# Patient Record
Sex: Male | Born: 1940
Health system: Southern US, Community
[De-identification: ages and names within clinical notes are randomized; demographics above are authoritative.]

## PROBLEM LIST (undated history)

## (undated) DIAGNOSIS — K219 Gastro-esophageal reflux disease without esophagitis: Secondary | ICD-10-CM

## (undated) DIAGNOSIS — I2510 Atherosclerotic heart disease of native coronary artery without angina pectoris: Secondary | ICD-10-CM

## (undated) DIAGNOSIS — E785 Hyperlipidemia, unspecified: Secondary | ICD-10-CM

## (undated) DIAGNOSIS — M1990 Unspecified osteoarthritis, unspecified site: Secondary | ICD-10-CM

## (undated) DIAGNOSIS — I10 Essential (primary) hypertension: Secondary | ICD-10-CM

## (undated) DIAGNOSIS — C801 Malignant (primary) neoplasm, unspecified: Secondary | ICD-10-CM

## (undated) DIAGNOSIS — IMO0001 Reserved for inherently not codable concepts without codable children: Secondary | ICD-10-CM

## (undated) DIAGNOSIS — F419 Anxiety disorder, unspecified: Secondary | ICD-10-CM

## (undated) DIAGNOSIS — Z955 Presence of coronary angioplasty implant and graft: Secondary | ICD-10-CM

## (undated) HISTORY — PX: LIPOMA EXCISION: SHX5283

## (undated) HISTORY — DX: Reserved for inherently not codable concepts without codable children: IMO0001

## (undated) HISTORY — DX: Malignant (primary) neoplasm, unspecified: C80.1

## (undated) HISTORY — PX: OTHER SURGICAL HISTORY: SHX169

## (undated) HISTORY — DX: Gastro-esophageal reflux disease without esophagitis: K21.9

## (undated) HISTORY — PX: CYSTECTOMY: SUR359

## (undated) HISTORY — DX: Atherosclerotic heart disease of native coronary artery without angina pectoris: I25.10

## (undated) HISTORY — DX: Hyperlipidemia, unspecified: E78.5

## (undated) HISTORY — PX: CATARACT EXTRACTION: SUR2

## (undated) HISTORY — PX: CORONARY ANGIOPLASTY WITH STENT PLACEMENT: SHX49

---

## 1998-03-14 ENCOUNTER — Ambulatory Visit (HOSPITAL_BASED_OUTPATIENT_CLINIC_OR_DEPARTMENT_OTHER): Admission: RE | Admit: 1998-03-14 | Discharge: 1998-03-14 | Payer: Self-pay | Admitting: General Surgery

## 1998-11-11 ENCOUNTER — Ambulatory Visit (HOSPITAL_COMMUNITY): Admission: RE | Admit: 1998-11-11 | Discharge: 1998-11-11 | Payer: Self-pay | Admitting: Gastroenterology

## 1999-05-20 ENCOUNTER — Inpatient Hospital Stay (HOSPITAL_COMMUNITY): Admission: EM | Admit: 1999-05-20 | Discharge: 1999-05-21 | Payer: Self-pay | Admitting: Emergency Medicine

## 2000-09-22 ENCOUNTER — Ambulatory Visit (HOSPITAL_COMMUNITY): Admission: RE | Admit: 2000-09-22 | Discharge: 2000-09-22 | Payer: Self-pay | Admitting: Cardiovascular Disease

## 2000-09-23 ENCOUNTER — Inpatient Hospital Stay (HOSPITAL_COMMUNITY): Admission: RE | Admit: 2000-09-23 | Discharge: 2000-09-26 | Payer: Self-pay | Admitting: Cardiovascular Disease

## 2000-10-05 ENCOUNTER — Ambulatory Visit (HOSPITAL_COMMUNITY): Admission: RE | Admit: 2000-10-05 | Discharge: 2000-10-06 | Payer: Self-pay | Admitting: Cardiovascular Disease

## 2001-11-07 ENCOUNTER — Encounter (INDEPENDENT_AMBULATORY_CARE_PROVIDER_SITE_OTHER): Payer: Self-pay | Admitting: Specialist

## 2001-11-07 ENCOUNTER — Ambulatory Visit (HOSPITAL_COMMUNITY): Admission: RE | Admit: 2001-11-07 | Discharge: 2001-11-07 | Payer: Self-pay | Admitting: Gastroenterology

## 2004-06-02 ENCOUNTER — Encounter: Admission: RE | Admit: 2004-06-02 | Discharge: 2004-06-02 | Payer: Self-pay | Admitting: Orthopedic Surgery

## 2004-06-04 ENCOUNTER — Ambulatory Visit (HOSPITAL_COMMUNITY): Admission: RE | Admit: 2004-06-04 | Discharge: 2004-06-04 | Payer: Self-pay | Admitting: Orthopedic Surgery

## 2004-06-04 ENCOUNTER — Ambulatory Visit (HOSPITAL_BASED_OUTPATIENT_CLINIC_OR_DEPARTMENT_OTHER): Admission: RE | Admit: 2004-06-04 | Discharge: 2004-06-04 | Payer: Self-pay | Admitting: Orthopedic Surgery

## 2004-06-04 ENCOUNTER — Encounter (INDEPENDENT_AMBULATORY_CARE_PROVIDER_SITE_OTHER): Payer: Self-pay | Admitting: Specialist

## 2007-05-09 HISTORY — PX: CARDIOVASCULAR STRESS TEST: SHX262

## 2008-07-05 ENCOUNTER — Ambulatory Visit (HOSPITAL_BASED_OUTPATIENT_CLINIC_OR_DEPARTMENT_OTHER): Admission: RE | Admit: 2008-07-05 | Discharge: 2008-07-05 | Payer: Self-pay | Admitting: Urology

## 2008-07-05 ENCOUNTER — Encounter (INDEPENDENT_AMBULATORY_CARE_PROVIDER_SITE_OTHER): Payer: Self-pay | Admitting: Urology

## 2010-06-16 ENCOUNTER — Ambulatory Visit: Payer: Self-pay | Admitting: Cardiovascular Disease

## 2010-12-16 ENCOUNTER — Ambulatory Visit (INDEPENDENT_AMBULATORY_CARE_PROVIDER_SITE_OTHER): Payer: Medicare Other | Admitting: Cardiovascular Disease

## 2010-12-16 DIAGNOSIS — Z9861 Coronary angioplasty status: Secondary | ICD-10-CM

## 2010-12-16 DIAGNOSIS — E7800 Pure hypercholesterolemia, unspecified: Secondary | ICD-10-CM

## 2011-02-16 NOTE — Op Note (Signed)
[name] [name], [name] [name]                 ACCOUNT NO.:  [id number]   MEDICAL RECORD NO.:  [id number]          PATIENT TYPE:  AMB   LOCATION:  [facility]                         FACILITY:  [facility]   PHYSICIAN:  Sigmund I. Patsi Sears, M.D.DATE OF BIRTH:  06-Jul-1941   DATE OF PROCEDURE:  07/05/2008  DATE OF DISCHARGE:                               OPERATIVE REPORT   PREOPERATIVE DIAGNOSIS:  Benign prostatic hypertrophy with obstruction.   POSTOPERATIVE DIAGNOSIS:  Benign prostatic hypertrophy with obstruction.   PROCEDURE:  1. Cystourethroscopy.  2. Transurethral resection of prostate with electrovaporization of the      prostate.   ATTENDING PHYSICIAN:  Sigmund I. Patsi Sears, M.D.   RESIDENT PHYSICIAN:  Dr. Delman Kitten.   ANESTHESIA:  General.   INDICATIONS FOR PROCEDURE:  Mr. Fell is a 70 year old white male who  has a past medical history positive for bothersome bladder outlet  obstruction.  He has been on alpha blocker therapy without significant  improvement.  He and Dr. Patsi Sears discussed his symptomatology and  possible further treatments and he requested to have his problem taken  care of in one single procedure.  Likewise the above stated procedure  was recommended.  Preoperatively risks, benefits, consequences and  concerns were discussed and informed consent was obtained.   PROCEDURE IN DETAIL:  The patient was brought to the operating room,  placed in supine position.  He was correctly identified by his wristband  and appropriate time-out was taken.  IV antibiotics were administered.  General anesthesia was delivered.  Once adequately anesthetized he was  placed in dorsal lithotomy position.  Great care was taken to minimize  the risk of peripheral neuropathy or compartment syndrome.  His perineum  was prepped and draped sterilely.  We began our procedure by performing  rigid cystourethroscopy.  We placed a 28 French rigid resectoscope  sheath over an obturator into his  bladder.  The obturator was removed  and an Olympus thin wire resectoscope and loop were placed into the  sheath.  Pancystoscopy did not demonstrate any urothelial abnormalities.  Both ureteral orifices were noted to be in their normal anatomic  position effluxing clear urine.  He did not have a tremendous amount of  trabeculation and no cellules were seen.  In advancing the resectoscope  into the prostatic urethra a high median bar was appreciated.  He did  have bilobar prostatic hypertrophy but the lobes did not coapt in the  midline.  He had a short prostate and the verumontanum was readily  visible.  We subsequently proceeded to resect tissue circumferentially  through his prostate and sent these TURP chips off to pathology for  analysis.  We then removed this current scope and replaced the 26 French  Olympus resectoscope initially over an obturator and then inserted the  resectoscope and the gyrus electrode.  We changed our irrigation to  normal saline and then systematically electrovaporized the remainder of  his prostate tissue taking great care to address first his bladder neck  in a trough in the 6 o'clock position then his left lobe, his right lobe  and  anteriorly.  Our resection did not advance distal to the  verumontanum.  There was minimal bleeding.  There was no evidence of  perforation and on final inspection the ureteral orifices were uninjured  and continued to efflux clear urine.  We fulgurated the bleeders and  subsequently removed the resectoscope.  We then placed a 24 [place] three-  way catheter and drained clear urine  from his bladder.  We inflated the balloon with 30 mL of sterile water  and this marked the end of our procedure.  He tolerate the procedure  well.  There were no complications.  Dr. Patsi Sears was present and  participated in all aspects of the case.  He awoke and was taken to the  recovery room in stable condition.      ______________________________  Dr Delman Kitten      Sigmund I. Patsi Sears, M.D.  Electronically Signed    DW/MEDQ  D:  07/05/2008  T:  07/06/2008  Job:  [id number]

## 2011-02-19 NOTE — Discharge Summary (Signed)
[place]. [facility]  Patient:    KINTE, TRIM                        MRN: [id number] Adm. Date:  [id number] Disc. Date: 10/06/00 Attending:  Koren Bound CC:         Delorse Lek, M.D.                           Discharge Summary  DISCHARGE DIAGNOSES: 1. Coronary artery disease, status post percutaneous transluminal coronary    angioplasty of the posterior descending artery. 2. Status post recent percutaneous transluminal coronary angioplasty and    stenting of the left circumflex artery. 3. Diffuse disease involving the left anterior descending artery.  CONDITION ON DISCHARGE:  Improved.  DISCHARGE MEDICATIONS: 1. Enteric-coated aspirin 325 mg a day. 2. Plavix 75 mg a day. 3. Imdur 30 mg a day. 4. Toprol XL 50 mg a day/may substitute atenolol 50 mg a day if patient    desires. 5. Prilosec 20 mg a day for one month. 6. Nitroglycerin as needed.  FOLLOW-UP:  The patient will see Dr. Elease Hashimoto in one week.  DISCHARGE INSTRUCTIONS:  He is to watch for any signs of bleeding.  He is to eat a low fat, low cholesterol diet.  He has been instructed to walk every day.  HISTORY OF PRESENT ILLNESS:  Mr. Wainwright is a 70 year old gentleman with a history of coronary artery disease.  He is status post successful PTCA and stenting of his left circumflex artery last week.  He returns today for the second staged procedure.  HOSPITAL COURSE: #1 - CORONARY ARTERY DISEASE:  The patient had a heart catheterization which revealed a tight 95-99% stenosis of the posterior descending artery.  He underwent successful PTCA of the distal right coronary artery/posterior descending artery using a 2.5 mm maverick balloon.  The patient tolerated the procedure quite well.  He is discharged today in satisfactory condition.  We will see him back in the office in follow-up.  We will check his cholesterol profile to further assess his risk for further coronary events.   DD:  10/06/00 TD:  10/06/00 Job: [id number]

## 2011-02-19 NOTE — Discharge Summary (Signed)
[place]. [facility]  Patient:    [name] [name], [name] [name]                        MRN: [id number] Adm. Date:  [id number] Disc. Date: 09/26/00 Attending:  Koren Bound                           Discharge Summary  ADMISSION DIAGNOSIS:  Unstable angina.  DISCHARGE DIAGNOSES: 1. Posterolateral myocardial infarction, status post percutaneous transluminal    coronary angioplasty and stenting of his left circumflex artery. 2. Unstable angina. 3. Diffuse coronary artery disease.  DISCHARGE MEDICATIONS: 1. Enteric-coated aspirin 325 mg a day. 2. Plavix 75 mg a day for 28 days. 3. Nitroglycerin 0.4 mg sublingual as needed. 4. Toprol XL 50 mg a day. 5. Imdur 30 mg a day.  DIET:  The patient has been instructed to eat a low fat, low cholesterol diet. He is to walk every day.  He is to watch for signs of bleeding.  FOLLOW-UP:  He is to see Dr. Elease Hashimoto on Thursday.  He still has a tight lesion in his posterior descending artery.  We will need to decide when he needs PTCA of this vessel.  We will allow him to heal up from this MI for a week or so.  HISTORY OF PRESENT ILLNESS:  [name] [name] is a 70 year old gentleman with a recent onset of unstable angina.  He had a heart catheterization last Thursday, which revealed diffuse three-vessel disease.  Dr. Tyrone Sage was consulted from [facility].  It was determined that Mr. Henery was not a good candidate for bypass because of the relatively diffusely diseased LAD.  He was then scheduled for PTCA of his left circumflex artery and right coronary artery.  He developed chest pain several days later and was admitted for unstable angina.  Please see dictated H&P for further details.  HOSPITAL COURSE: #1 - CHEST PAIN:  The patient had a prolonged episode of chest pain on admission.  He ruled in for myocardial infarction with peak CPKs of 964 with 104 MBs.  His EKG remained unremarkable.  He was taken to the  catheterization laboratory and was found to have an occluded circumflex artery.  He underwent successful PTCA and stenting of his left circumflex artery by Dr. Peter Swaziland.  The patient improved significantly.  The following day he was up ambulating.  He is currently stable from a cardiovascular standpoint.  The patient still has a tight stenosis in his posterior descending artery.  He is stable, and this does not appear to need to be opened at the current time.  We will plan on an elective PTCA in the next week or so.  The patient will be discharged on the above-noted medications and disposition. DD:  09/26/00 TD:  09/27/00 Job: [id number]

## 2011-02-19 NOTE — Cardiovascular Report (Signed)
[place]. [facility]  Patient:    [name] [name], [name] [name]                        MRN: [id number] Proc. Date: 09/22/00 Adm. Date:  [id number] Disc. Date: [id number] Attending:  Koren Bound CC:         Delorse Lek, M.D.  Gwenith Daily Tyrone Sage, M.D.  Cardiac Catheterization Laboratory   Cardiac Catheterization  INDICATIONS:  Mr. Ravenscroft is a 70 year old gentleman, with a recent onset of classical angina.  He has had chest tightness with minimal exertion for the past several weeks.  The patient does not have any significant other medical problems.  The patient was referred for heart catheterization after having an early positive treadmill test, which revealed ischemia just after three minutes.  PROCEDURES:  Left heart catheterization with coronary angiography.  DESCRIPTION OF PROCEDURE:  The right femoral artery was easily cannulated using the modified Seldinger technique.  HEMODYNAMICS:  The left ventricular pressure is 127/16 with an aortic pressure of 127/80.  ANGIOGRAPHY:  The left main coronary artery is fairly large.  There is a 40% stenosis in the distal aspect of the left main.  This lesion does not appear to obstruct blood flow.  The left anterior descending artery is heavily calcified.  There is a diffuse 20-30% stenosis in the proximal LAD.  The mid LAD is diseased to approximately 40-50%.  The distal LAD has diffuse 75-80% stenosis throughout with several discrete 90% stenoses.  The first diagonal vessel is a moderate sized vessel with a 70-80% stenosis proximally.  The second diagonal vessel is a small to moderate sized vessel with a 50% stenosis proximally.  The left circumflex artery is a fairly large vessel.  There is a 50% stenosis very proximally followed by a tight 99% stenoses in the first obtuse marginal artery.  The first obtuse marginal artery bifurcates towards its termination. There is a tight "star lesion" involving  these two distal branches.  These stenoses are each approximately 95%.  The right coronary artery is a large and dominant vessel.  There is a 30% stenosis proximally.  The mid right coronary artery has only mild irregularities.  The distal right coronary artery has some diffuse disease.  There is a 90% stenosis in the proximal aspect of the posterior descending artery. The posterolateral segment artery is fairly normal.  The left subclavian artery is largely free of disease.  The left internal mammary artery is normal.  The left vertebral artery has a proximal 40% stenosis.  LEFT VENTRICULOGRAM:  The left ventriculogram was performed in a 30 RAO position.  It reveals overall normal left ventricular systolic function. There is no significant mitral regurgitation.  The ejection fraction is approximately 70%.  COMPLICATIONS:  None.  CONCLUSIONS:  Diffuse coronary artery disease involving all three coronary arteries.  There is significant distal disease.  It is not clear whether these distal vessels are good targets for bypass grafting.  We could perform angioplasty and stenting on these vessels, although the risks of re-stenosis is quite high, given the severity and the diffuseness of the disease.  We will have Dr. Tyrone Sage review the films.  We will add Imdur to his current medications (including aspirin and Toprol). DD:  09/22/00 TD:  09/23/00 Job: [id number]

## 2011-02-19 NOTE — Op Note (Signed)
NAME:  [name] [name], [name] [name]                           ACCOUNT NO.:  [id number]   MEDICAL RECORD NO.:  [id number]                   PATIENT TYPE:  AMB   LOCATION:  [facility]                                  FACILITY:  [facility]   PHYSICIAN:  Cindee Salt, M.D.                    DATE OF BIRTH:  08/04/1941   DATE OF PROCEDURE:  06/04/2004  DATE OF DISCHARGE:                                 OPERATIVE REPORT   PREOPERATIVE DIAGNOSIS:  Mucoid cyst, right thumb.   POSTOPERATIVE DIAGNOSIS:  Mucoid cyst, right thumb.   OPERATION:  Incision of cyst with debridement of interphalangeal joint --  right thumb.   SURGEON:  Cindee Salt, M.D.   ASSISTANT:  Carolyne Fiscal   ANESTHESIA:  Forearm-based IV regional.   HISTORY:  The patient is a 70 year old male with a history of a mucoid cyst  on his right thumb.  This has ruptured and healed over.   DESCRIPTION OF PROCEDURE:  The patient is brought to the operating room,  where a forearm-based IV regional anesthetic was carried out without  difficulty.  He was prepped and draped using Duraprep in the supine  position, with the right arm free.   Following adequate anesthesia, a curvilinear incision was made just proximal  to the mass.  This was carried down through subcutaneous tissue.  A moderate  amount of granulation tissue and scarring was present.  The cyst was easily  identified; this was excised and sent to pathology.  The joint was opened.  A rongeur was then used to perform a synovectomy and debride osteophytes  from the interphalangeal joint.  The wound was irrigated.  The skin was then  able to be rotated distally, after excision of the entire skin surrounding  the cyst and cystic area.  This was then irrigated and closed with  interrupted 5-0 nylon suture.   A sterile compressive dressing was applied to the thumb.  The patient  tolerated the procedure well and was taken to the recovery room for  observation in satisfactory condition.  He is discharged home to  return to  the [facility] [place] in one week on Vicodin.                                               Cindee Salt, M.D.    Angelique Blonder  D:  06/04/2004  T:  06/04/2004  Job:  [id number]

## 2011-02-19 NOTE — H&P (Signed)
[place]. [facility]  Patient:    ARCHIE, SHEA                        MRN: [id number] Adm. Date:  [id number] Disc. Date: [id number] Attending:  Koren Bound CC:         Delorse Lek, M.D.   History and Physical  CHIEF COMPLAINT: Mr. Safley is a 70 year old gentleman with a history of coronary artery disease, admitted with symptoms of unstable angina.  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  He has been in relatively good health.  For the past week or so he has had worsening episodes of chest pain.  He was seen in the office on Wednesday for symptoms of unstable angina and on Thursday he underwent heart catheterization, which revealed moderate to severe coronary artery disease involving all three coronary arteries.  His LAD had diffuse disease involving the mid and distal vessel.  This vessel was determined not to be a good candidate for angioplasty or bypass surgery.  His left circumflex artery had a proximal 90% stenosis and a 90% star lesion at the bifurcation of the first obtuse marginal artery.  The right coronary artery was a large and dominant vessel.  There was a 90% stenosis in the takeoff of the posterior descending artery.  Dr. Tyrone Sage was consulted and in his opinion felt the LAD was not a good bypass target.  He suggested culprit angioplasty involving the left circumflex artery and right coronary artery as an initial treatment regimen since the LAD was not a good target.  Our plan was to do angioplasty next week.  He was to come into the office today to discuss these issues and on the way in he started having episodes of chest pain.  In the office today he had severe chest tightness.  The chest pain was 90% relieved with sublingual nitroglycerin.  He was admitted to the hospital with symptoms of unstable angina.  CURRENT MEDICATIONS:  1. Imdur 30 mg q.d.  2. Toprol XL 50 mg q.d.  3.  Nitroglycerin sublingual.  4. Aspirin 325 mg q.d.  5. He has also been taking ibuprofen 600-800 mg q.d.  ALLERGIES: No known drug allergies.  PAST MEDICAL HISTORY:  1. Gastroesophageal reflux.  2. Benign cyst on his back.  3. Hypercholesterolemia.  SOCIAL HISTORY: The patient works as a Museum/gallery curator.  He bowls quite regularly.  He does not smoke and does not drink.  FAMILY HISTORY: His father died at the age of 27 due to myocardial infarction. His mother died at the age of 43 due to cancer.  REVIEW OF SYSTEMS: His Review Of Systems was reviewed and is essentially negative.  PHYSICAL EXAMINATION:  GENERAL: HE is middle-aged gentleman in moderate distress.  His chest pain is significantly better than when he originally presented to the office.  VITAL SIGNS: Weight 210 pounds.  Blood pressure is 120/90 in the left arm and in the right arm is 130/90.  His heart rate is 88.  HEENT/NECK: There are 2+ carotids.  There is no JVD.  He has no thyromegaly and no lymphadenopathy.  CHEST: His lungs were clear to auscultation.  BACK: HIs back is nontender.  HEART: Regular rate.  S1 and S2.  No murmurs, rubs, or gallops.  ABDOMEN: Good bowel sounds, nontender.  EXTREMITIES: No clubbing, cyanosis, or edema.  The catheterization site is well-healed.  There is no calf  tenderness.  His distal pulses are intact.  NEUROLOGIC: Cranial nerves 2-12 intact.  Motor and sensory function are intact.  His gait is fairly normal.  LABORATORY DATA: His EKG obtained in the office and here in the hospital reveals normal sinus rhythm.  There are no acute ST-T wave changes.  IMPRESSION/PLAN:  1. Mr. Cutshaw presents with symptoms consistent with unstable angina. He has     known severe coronary artery disease.  We will place him on intravenous     nitroglycerin and intravenous heparin.  We will anticipate doing     percutaneous transluminal coronary angioplasty of his left circumflex     artery and  right coronary artery on Monday.  We will proceed with     angioplasty sooner if he becomes unstable.  2. Hypercholesterolemia.  His fasting lipid profile from the office on     September 20, 2000 revealed a triglyceride level of 578 and a total     cholesterol of 185.  We will placed him on a diet and perhaps some     medications, and recheck his lipids soon.  3. His other medical problems are currently stable.   DD:  09/23/00 TD:  09/24/00 Job: [id number]

## 2011-02-19 NOTE — Cardiovascular Report (Signed)
[place]. [facility]  Patient:    [name] [name], [name] [name]                        MRN: [id number] Proc. Date: 10/05/00 Adm. Date:  [id number] Attending:  Koren Bound CC:         Cardiac Catheterization Lab  Delorse Lek, M.D.   Cardiac Catheterization  HISTORY:  [name] is a 70 year old gentleman with a recent onset of angina. He had successful PTCA and stenting of his left circumflex artery last week as an acute intervention.  He has known stenosis of his posterior descending artery and moderate disease involving his LAD.  He is brought back to today for PTCA of his posterior descending artery.  PROCEDURE:  Cardiac catheterization and percutaneous transluminal coronary angioplasty posterior descending artery.  CARDIOLOGIST:  Vesta Mixer, Montez Hageman., M.D.  DESCRIPTION OF PROCEDURE:  The right femoral artery was easily cannulated using a modified Seldinger technique.  A 7-French sheath was placed.  We took a quick relook at the left circumflex system.  The stent was found to be widely patent.  The distal circumflex artery was widely patent in the larger branch.  The very small distal branch has an 80 to 90% stenosis which is basically unchanged from before his angioplasty.  The right coronary artery has diffuse disease involving the proximal and mid segments.  The posterior descending artery has a 95% stenosis at its proximal segment.  The patient was given 4800 units of heparin.  A double bolus Integrilin drip was given.  The posterior descending artery was easily wired using an 014 short Patriot.  A 2.5 mm x 15 mm Maverick balloon was positioned across the stenosis.  One inflation up to 6 atmospheres was performed for 1 minute.  This resulted in a widely patent lumen.  There was a 0% residual stenosis.  The proximal and mid lesions were not affected by passage of the wire or the balloon.  COMPLICATIONS:  None.  CONCLUSIONS: 1. Successful  percutaneous transluminal coronary angioplasty (PTCA) of the    posterior descending artery. 2. Good short-term results of the PTCA and stenting of the left circumflex    artery. 3. Moderate disease involving the left anterior descending artery.  We will    continue with medical therapy. DD:  10/05/00 TD:  10/05/00 Job: [id number]

## 2011-02-19 NOTE — Cardiovascular Report (Signed)
[place]  Patient:    [name] [name], [name] [name]                        MRN: [id number] Proc. Date: 09/24/00 Adm. Date:  [id number] Disc. Date: [id number] Attending:  Koren Bound CC:         Alvia Grove., M.D.   Cardiac Catheterization  PROCEDURES PERFORMED:  Emergent cardiac catheterization and stent procedure.  INDICATIONS FOR PROCEDURE:  The patient is a 70 year old white male, who recently underwent cardiac catheterization which showed significant three-vessel coronary disease.  The patient was considered for bypass surgery, but due to the fact that the disease, particularly in the LAD was predominately distal, it was felt that he would be better for a catheter based intervention of the culprit vessel.  He did have a very high-grade stenosis in the proximal circumflex.  He was admitted with unstable angina.  On the day of procedure, the patient had refractory chest pain despite maximal medical therapy.  His ECG remained normal but cardiac enzymes were elevated.  He was brought to the lab for acute intervention.  ACCESS:  Via the right femoral artery using the standard Seldinger technique.  EQUIPMENT:  A 7 French arterial sheath, 6 French 4 cm right and left Judkins catheter, 6 French pigtail catheter. a 7 [place] Voda left 4 guide, 0.014 Hi-Torque Floppy wire, a 2.5 x 15 mm CrossSail balloon and a 3.0 x 15 mm P Penta stent.  HEMODYNAMIC DATA:  Aortic pressure was 139/91, left ventricular pressure 130/31.  There was no aortic valve gradient by pullback.  MEDICATIONS:  The patient was on continuous infusion of IV Integrilin and also on IV nitroglycerin.  He was given additional intracoronary nitroglycerin 200 mcg x 3, and a total of 5800 units of heparin IV.  ANGIOGRAPHIC DATA:  Limited angiography demonstrated the left coronary artery with a 20-30% distal left main stenosis.  The left anterior descending artery was diffusely  calcified proximally with minor wall irregularities.  In the mid to distal vessels there were sequential 70-80% stenoses, and in the far distal LAD, there was sequential 90% stenoses. The first and second diagonal branches were small with 70-80% stenoses at the takeoff.  The left circumflex coronary was occluded proximally at the site of a previous high-grade stenosis.  The right coronary artery arises and distributes normally.  There was 30% narrowing proximally.  The posterior descending artery had a 90% stenosis at its takeoff.  LEFT VENTRICULAR ANGIOGRAPHY:  Left ventricular angiography in the RAO view demonstrated normal left ventricular size and contractility with normal systolic function.  Ejection fraction was estimated at 65%.  We proceeded with acute intervention of the circumflex coronary artery.  Due to acute angulation of the circumflex from the left main coronary artery, this was a difficult lesion to cross.  Initial guide with a JL4 guide was yielded inadequate support, and we switched to a Voda guide.  With this we were able to cross the lesion with the guidewire and subsequently performed initial inflation using a 2.5 mm CrossSail balloon up to 8 atmospheres.  This yielded reperfusion of the vessel.  It was evident at this point that there was also a 90% stenosis in the obtuse marginal vessel at the bifurcation of two branches. We selected the larger of the two branches and performed balloon angioplasty of this lesion, also using the 2.5 mm balloon in dilatating to 8 atmospheres. This yielded  excellent angiographic result in the larger branch.  We then proceeded with stenting of the proximal to mid circumflex using a 3.0 x 15 mm Penta stent.  This was deployed at 8 atmospheres and postdilated to 14 atmospheres with an excellent angiographic result.  In the site of the previous occlusion, there was now a 0% residual stenosis with no filling defects noted.  The obtuse  marginal vessel also had 0% residual stenosis at the site of the angioplasty.  There was still a small side branch with an 80-90% narrowing, but it was felt that this branch was too small to dilate.  FINAL INTERPRETATION: 1. Three-vessel obstructive atherosclerotic coronary artery disease with    acute recent occlusion of the left circumflex coronary artery. 2. Successful stenting of the proximal left circumflex coronary artery and    successful balloon angioplasty of the first obtuse marginal vessel. 3. Normal left ventricular function. DD:  09/29/00 TD:  09/29/00 Job: [id number]

## 2011-03-18 ENCOUNTER — Other Ambulatory Visit: Payer: Self-pay | Admitting: *Deleted

## 2011-03-18 DIAGNOSIS — E785 Hyperlipidemia, unspecified: Secondary | ICD-10-CM

## 2011-03-19 ENCOUNTER — Other Ambulatory Visit (INDEPENDENT_AMBULATORY_CARE_PROVIDER_SITE_OTHER): Payer: Medicare Other | Admitting: *Deleted

## 2011-03-19 ENCOUNTER — Other Ambulatory Visit: Payer: Self-pay | Admitting: Cardiovascular Disease

## 2011-03-19 DIAGNOSIS — E785 Hyperlipidemia, unspecified: Secondary | ICD-10-CM

## 2011-03-19 LAB — HEPATIC FUNCTION PANEL
ALT: 28 U/L (ref 0–53)
AST: 22 U/L (ref 0–37)
Albumin: 4.3 g/dL (ref 3.5–5.2)
Alkaline Phosphatase: 65 U/L (ref 39–117)
Bilirubin, Direct: 0.1 mg/dL (ref 0.0–0.3)
Total Bilirubin: 0.6 mg/dL (ref 0.3–1.2)
Total Protein: 7.4 g/dL (ref 6.0–8.3)

## 2011-03-19 LAB — LDL CHOLESTEROL, DIRECT: Direct LDL: 152 mg/dL

## 2011-03-19 LAB — LIPID PANEL
Cholesterol: 205 mg/dL — ABNORMAL HIGH (ref 0–200)
HDL: 41.7 mg/dL (ref >39.00)
Total CHOL/HDL Ratio: 5
Triglycerides: 110 mg/dL (ref 0.0–149.0)
VLDL: 22 mg/dL (ref 0.0–40.0)

## 2011-03-19 LAB — BASIC METABOLIC PANEL
BUN: 17 mg/dL (ref 6–23)
CO2: 28 mEq/L (ref 19–32)
Calcium: 9.1 mg/dL (ref 8.4–10.5)
Chloride: 103 mEq/L (ref 96–112)
Creatinine, Ser: 0.8 mg/dL (ref 0.4–1.5)
GFR: 105.28 mL/min (ref >60.00)
Glucose, Bld: 92 mg/dL (ref 70–99)
Potassium: 4.7 mEq/L (ref 3.5–5.1)
Sodium: 137 mEq/L (ref 135–145)

## 2011-03-22 ENCOUNTER — Other Ambulatory Visit: Payer: Medicare Other | Admitting: *Deleted

## 2011-03-23 ENCOUNTER — Other Ambulatory Visit: Payer: Self-pay | Admitting: *Deleted

## 2011-03-23 ENCOUNTER — Other Ambulatory Visit: Payer: Self-pay | Admitting: Cardiovascular Disease

## 2011-03-23 NOTE — Telephone Encounter (Signed)
Pt called with lab results(cholesterol) while on zetia, wants to try lipitor 40mg , dr ok with it and msg left on his home line. Pt not allergic to med thought muscle ache  was from exertion.

## 2011-03-23 NOTE — Progress Notes (Signed)
Pt wanted to go on lipitor, dr ok with that, escribe done,

## 2011-03-23 NOTE — Telephone Encounter (Signed)
Pt cant have lipitor 80mg  cut in half, went

## 2011-03-24 ENCOUNTER — Other Ambulatory Visit: Payer: Self-pay | Admitting: *Deleted

## 2011-03-24 MED ORDER — ATORVASTATIN CALCIUM 40 MG PO TABS: 40.0000 mg | ORAL_TABLET | Freq: Every day | ORAL | Status: DC

## 2011-06-14 ENCOUNTER — Encounter: Payer: Self-pay | Admitting: Cardiovascular Disease

## 2011-06-21 ENCOUNTER — Encounter: Payer: Self-pay | Admitting: Cardiovascular Disease

## 2011-06-21 ENCOUNTER — Ambulatory Visit (INDEPENDENT_AMBULATORY_CARE_PROVIDER_SITE_OTHER): Payer: Medicare Other | Admitting: Cardiovascular Disease

## 2011-06-21 DIAGNOSIS — E785 Hyperlipidemia, unspecified: Secondary | ICD-10-CM

## 2011-06-21 DIAGNOSIS — I2510 Atherosclerotic heart disease of native coronary artery without angina pectoris: Secondary | ICD-10-CM

## 2011-06-21 LAB — LIPID PANEL
Cholesterol: 118 mg/dL (ref 0–200)
HDL: 34.1 mg/dL — ABNORMAL LOW (ref >39.00)
LDL Cholesterol: 68 mg/dL (ref 0–99)
Total CHOL/HDL Ratio: 3
Triglycerides: 79 mg/dL (ref 0.0–149.0)
VLDL: 15.8 mg/dL (ref 0.0–40.0)

## 2011-06-21 LAB — HEPATIC FUNCTION PANEL
ALT: 27 U/L (ref 0–53)
AST: 23 U/L (ref 0–37)
Albumin: 4.4 g/dL (ref 3.5–5.2)
Alkaline Phosphatase: 72 U/L (ref 39–117)
Bilirubin, Direct: 0.2 mg/dL (ref 0.0–0.3)
Total Bilirubin: 1.1 mg/dL (ref 0.3–1.2)
Total Protein: 7.6 g/dL (ref 6.0–8.3)

## 2011-06-21 LAB — BASIC METABOLIC PANEL
BUN: 16 mg/dL (ref 6–23)
CO2: 27 mEq/L (ref 19–32)
Calcium: 10.1 mg/dL (ref 8.4–10.5)
Chloride: 102 mEq/L (ref 96–112)
Creatinine, Ser: 0.8 mg/dL (ref 0.4–1.5)
GFR: 97.92 mL/min (ref >60.00)
Glucose, Bld: 95 mg/dL (ref 70–99)
Potassium: 4.8 mEq/L (ref 3.5–5.1)
Sodium: 136 mEq/L (ref 135–145)

## 2011-06-21 NOTE — Assessment & Plan Note (Signed)
He's now on Lipitor and niacin. We'll check lipids today and recheck him again in 6 months.

## 2011-06-21 NOTE — Progress Notes (Signed)
[name] [name] Date of Birth  1941-06-23 [facility] [address]     [address] [place], [place]  [address] [phone]  Fax  [phone]  History of Present Illness:  70 year old gentleman with a history of coronary artery disease. He status post PTCA and stenting of the left circumflex artery as well as PTCA of  the posterior descending artery and January 2002. He's done very well. He has not had any episodes of chest pain or shortness of breath.  Since I last saw him we have changed his simvastatin to Lipitor and he is also taking niacin slow release.  Current Outpatient Prescriptions on File Prior to Visit  Medication Sig Dispense Refill  . aspirin 325 MG tablet Take 325 mg by mouth daily.        [name] atorvastatin (LIPITOR) 40 MG tablet Take 1 tablet (40 mg total) by mouth daily.  90 tablet  1  . Calcium Carbonate-Vitamin D (CALCIUM + D PO) Take by mouth daily.        . metoprolol tartrate (LOPRESSOR) 25 MG tablet Take 25 mg by mouth 2 (two) times daily.        . Multiple Vitamin (MULTIVITAMIN PO) Take by mouth daily.        . niacin (NIASPAN) 1000 MG CR tablet Take 1,000 mg by mouth daily.        [name] omeprazole (PRILOSEC) 20 MG capsule Take 20 mg by mouth daily.          Allergies  Allergen Reactions  . Atorvastatin Other (See Comments)    Leg pain   . Simvastatin Other (See Comments)    Leg pain     Past Medical History  Diagnosis Date  . Hyperlipidemia   . Coronary artery disease     status post PTCA and stenting of his left circumflex artery and right coronary artery  . Reflux     Hx Gastroesophageal    Past Surgical History  Procedure Date  . Cystectomy     removed from finger  . Benign cyst     on back  . Cardiovascular stress test 05-09-2007    EF 64%    History  Smoking status  . Never Smoker   Smokeless tobacco  . Not on file    History  Alcohol Use No    No family history on file.  Reviw of Systems:    Reviewed in the HPI.  All other systems are negative.  Physical Exam: BP 120/82  Pulse 76  Ht 5\' 9"  (1.753 m)  Wt 206 lb 3.2 oz (93.532 kg)  BMI 30.45 kg/m2 The patient is alert and oriented x 3.  The mood and affect are normal.   Skin: warm and dry.  Color is normal.    HEENT:   the sclera are nonicteric.  The mucous membranes are moist.  The carotids are 2+ without bruits.  There is no thyromegaly.  There is no JVD.    Lungs: clear.  The chest wall is non tender.    Heart: regular rate with a normal S1 and S2.  There are no murmurs, gallops, or rubs. The PMI is not displaced.     Abdomen: good bowel sounds.  There is no guarding or rebound.  There is no hepatosplenomegaly or tenderness.  There are no masses.   Extremities:  no clubbing, cyanosis, or edema.  The legs are without rashes.  The distal pulses are intact.  Neuro:  Cranial nerves II - XII are intact.  Motor and sensory functions are intact.    The gait is normal.  ECG:  Assessment / Plan:

## 2011-06-21 NOTE — Assessment & Plan Note (Signed)
He's not had any episodes of angina. We'll continue with his same medications.

## 2011-06-22 ENCOUNTER — Telehealth: Payer: Self-pay | Admitting: *Deleted

## 2011-06-22 NOTE — Telephone Encounter (Signed)
Patient called with lab results. Pt verbalized understanding. Jodette Raider Valbuena RN  

## 2011-06-24 ENCOUNTER — Other Ambulatory Visit: Payer: Self-pay | Admitting: *Deleted

## 2011-06-24 MED ORDER — METOPROLOL TARTRATE 50 MG PO TABS: 25.0000 mg | ORAL_TABLET | Freq: Two times a day (BID) | ORAL | Status: DC

## 2011-06-24 NOTE — Telephone Encounter (Signed)
Pt called and verified strength and dose.

## 2011-07-06 LAB — POCT I-STAT 4, (NA,K, GLUC, HGB,HCT)
Glucose, Bld: 103 — ABNORMAL HIGH
HCT: 46
Hemoglobin: 15.6
Potassium: 4.1
Sodium: 138

## 2011-09-18 ENCOUNTER — Other Ambulatory Visit: Payer: Self-pay | Admitting: Cardiovascular Disease

## 2011-12-21 ENCOUNTER — Ambulatory Visit: Payer: Medicare Other | Admitting: Cardiovascular Disease

## 2012-01-17 ENCOUNTER — Ambulatory Visit (INDEPENDENT_AMBULATORY_CARE_PROVIDER_SITE_OTHER): Payer: Medicare Other | Admitting: Cardiovascular Disease

## 2012-01-17 ENCOUNTER — Other Ambulatory Visit (INDEPENDENT_AMBULATORY_CARE_PROVIDER_SITE_OTHER): Payer: Medicare Other

## 2012-01-17 ENCOUNTER — Encounter: Payer: Self-pay | Admitting: Cardiovascular Disease

## 2012-01-17 VITALS — BP 120/79 | HR 78 | Ht 69.0 in | Wt 204.8 lb

## 2012-01-17 DIAGNOSIS — E785 Hyperlipidemia, unspecified: Secondary | ICD-10-CM

## 2012-01-17 DIAGNOSIS — I2510 Atherosclerotic heart disease of native coronary artery without angina pectoris: Secondary | ICD-10-CM

## 2012-01-17 DIAGNOSIS — I10 Essential (primary) hypertension: Secondary | ICD-10-CM

## 2012-01-17 LAB — HEPATIC FUNCTION PANEL
ALT: 26 U/L (ref 0–53)
AST: 30 U/L (ref 0–37)
Albumin: 4.2 g/dL (ref 3.5–5.2)
Alkaline Phosphatase: 57 U/L (ref 39–117)
Bilirubin, Direct: 0.2 mg/dL (ref 0.0–0.3)
Total Bilirubin: 1.1 mg/dL (ref 0.3–1.2)
Total Protein: 7.4 g/dL (ref 6.0–8.3)

## 2012-01-17 LAB — LIPID PANEL
Cholesterol: 99 mg/dL (ref 0–200)
HDL: 32 mg/dL — ABNORMAL LOW (ref >39.00)
LDL Cholesterol: 59 mg/dL (ref 0–99)
Total CHOL/HDL Ratio: 3
Triglycerides: 42 mg/dL (ref 0.0–149.0)
VLDL: 8.4 mg/dL (ref 0.0–40.0)

## 2012-01-17 LAB — BASIC METABOLIC PANEL
BUN: 18 mg/dL (ref 6–23)
CO2: 23 mEq/L (ref 19–32)
Calcium: 9.2 mg/dL (ref 8.4–10.5)
Chloride: 104 mEq/L (ref 96–112)
Creatinine, Ser: 0.8 mg/dL (ref 0.4–1.5)
GFR: 106.59 mL/min (ref >60.00)
Glucose, Bld: 94 mg/dL (ref 70–99)
Potassium: 4.8 mEq/L (ref 3.5–5.1)
Sodium: 138 mEq/L (ref 135–145)

## 2012-01-17 NOTE — Progress Notes (Signed)
[name] [name] Date of Birth  1940/12/08 [facility] [address]     [address] [place], [place]  [address] [phone]  Fax  [phone]  Problem list: 1. Coronary artery disease-status post PTCA and stenting of his left circumflex artery and right coronary artery 2. Hyperlipidemia  History of Present Illness:  71 year old gentleman with a history of coronary artery disease. He status post PTCA and stenting of the left circumflex artery as well as PTCA of  the posterior descending artery and January 2002. He's done very well. He has not had any episodes of chest pain or shortness of breath.  Since I last saw him we have changed his simvastatin to Lipitor and he is also taking niacin slow release.  Current Outpatient Prescriptions on File Prior to Visit  Medication Sig Dispense Refill  . aspirin 325 MG tablet Take 325 mg by mouth daily.        [name] atorvastatin (LIPITOR) 40 MG tablet TAKE 1 TABLET EVERY DAY  90 tablet  1  . Calcium Carbonate-Vitamin D (CALCIUM + D PO) Take by mouth daily.        . metoprolol tartrate (LOPRESSOR) 50 MG tablet Take 0.5 tablets (25 mg total) by mouth 2 (two) times daily.  90 tablet  3  . Multiple Vitamin (MULTIVITAMIN PO) Take by mouth daily.        . niacin (NIASPAN) 1000 MG CR tablet Take 1,000 mg by mouth daily.        . Omega-3 Fatty Acids (FISH OIL) 1200 MG CAPS Take 1,200 mg by mouth daily.        [name] omeprazole (PRILOSEC) 20 MG capsule Take 20 mg by mouth daily.          Allergies  Allergen Reactions  . Simvastatin Other (See Comments)    Leg pain     Past Medical History  Diagnosis Date  . Hyperlipidemia   . Coronary artery disease     status post PTCA and stenting of his left circumflex artery and right coronary artery  . Reflux     Hx Gastroesophageal    Past Surgical History  Procedure Date  . Cystectomy     removed from finger  . Benign cyst     on back  . Cardiovascular stress  test 05-09-2007    EF 64%    History  Smoking status  . Never Smoker   Smokeless tobacco  . Not on file    History  Alcohol Use No    No family history on file.  Reviw of Systems:  Reviewed in the HPI.  All other systems are negative.  Physical Exam: BP 120/79  Pulse 78  Ht 5\' 9"  (1.753 m)  Wt 204 lb 12.8 oz (92.897 kg)  BMI 30.24 kg/m2 The patient is alert and oriented x 3.  The mood and affect are normal.   Skin: warm and dry.  Color is normal.    HEENT:   the sclera are nonicteric.  The mucous membranes are moist.  The carotids are 2+ without bruits.  There is no thyromegaly.  There is no JVD.    Lungs: clear.  The chest wall is non tender.    Heart: regular rate with a normal S1 and S2.  There are no murmurs, gallops, or rubs. The PMI is not displaced.     Abdomen: good bowel sounds.  There is no guarding or rebound.  There is  no hepatosplenomegaly or tenderness.  There are no masses.   Extremities:  no clubbing, cyanosis, or edema.  The legs are without rashes.  The distal pulses are intact.   Neuro:  Cranial nerves II - XII are intact.  Motor and sensory functions are intact.    The gait is normal.  ECG: 01/17/2012-normal sinus rhythm. Normal EKG. Assessment / Plan:

## 2012-01-17 NOTE — Assessment & Plan Note (Signed)
We'll check fasting lab for today.

## 2012-01-17 NOTE — Assessment & Plan Note (Signed)
It has been very stable. He's not had any episodes of angina. He's been doing all of his normal activities without any cigarette problems. I'll see him again in 6 months for office visit and fasting laboratory

## 2012-01-17 NOTE — Patient Instructions (Signed)
Your physician wants you to follow-up in: 6 months You will receive a reminder letter in the mail two months in advance. If you don't receive a letter, please call our office to schedule the follow-up appointment.  Your physician recommends that you return for a FASTING lipid profile: today and in 6 months  

## 2012-03-24 ENCOUNTER — Other Ambulatory Visit: Payer: Self-pay | Admitting: Cardiology

## 2012-03-24 MED ORDER — ATORVASTATIN CALCIUM 40 MG PO TABS: 40.0000 mg | ORAL_TABLET | Freq: Every day | ORAL | Status: DC

## 2012-06-29 ENCOUNTER — Other Ambulatory Visit: Payer: Self-pay | Admitting: *Deleted

## 2012-06-29 MED ORDER — METOPROLOL TARTRATE 50 MG PO TABS: 25.0000 mg | ORAL_TABLET | Freq: Two times a day (BID) | ORAL | Status: DC

## 2012-06-29 NOTE — Telephone Encounter (Signed)
Fax Received. Refill Completed. [name] [name] (R.M.A)   

## 2012-07-13 ENCOUNTER — Other Ambulatory Visit: Payer: Self-pay | Admitting: *Deleted

## 2012-07-13 ENCOUNTER — Other Ambulatory Visit (INDEPENDENT_AMBULATORY_CARE_PROVIDER_SITE_OTHER): Payer: Medicare Other

## 2012-07-13 DIAGNOSIS — E785 Hyperlipidemia, unspecified: Secondary | ICD-10-CM

## 2012-07-13 LAB — BASIC METABOLIC PANEL
BUN: 16 mg/dL (ref 6–23)
CO2: 28 mEq/L (ref 19–32)
Calcium: 9.3 mg/dL (ref 8.4–10.5)
Chloride: 100 mEq/L (ref 96–112)
Creatinine, Ser: 0.8 mg/dL (ref 0.4–1.5)
GFR: 100.4 mL/min (ref >60.00)
Glucose, Bld: 85 mg/dL (ref 70–99)
Potassium: 4.2 mEq/L (ref 3.5–5.1)
Sodium: 134 mEq/L — ABNORMAL LOW (ref 135–145)

## 2012-07-13 LAB — HEPATIC FUNCTION PANEL
ALT: 22 U/L (ref 0–53)
AST: 21 U/L (ref 0–37)
Albumin: 3.9 g/dL (ref 3.5–5.2)
Alkaline Phosphatase: 67 U/L (ref 39–117)
Bilirubin, Direct: 0.1 mg/dL (ref 0.0–0.3)
Total Bilirubin: 0.7 mg/dL (ref 0.3–1.2)
Total Protein: 7.1 g/dL (ref 6.0–8.3)

## 2012-07-13 LAB — LIPID PANEL
Cholesterol: 97 mg/dL (ref 0–200)
HDL: 34.6 mg/dL — ABNORMAL LOW (ref >39.00)
LDL Cholesterol: 54 mg/dL (ref 0–99)
Total CHOL/HDL Ratio: 3
Triglycerides: 42 mg/dL (ref 0.0–149.0)
VLDL: 8.4 mg/dL (ref 0.0–40.0)

## 2012-07-13 NOTE — Progress Notes (Signed)
Labs to be drawn today

## 2012-07-17 ENCOUNTER — Ambulatory Visit: Payer: Medicare Other | Admitting: Cardiovascular Disease

## 2012-07-17 ENCOUNTER — Encounter: Payer: Self-pay | Admitting: Cardiovascular Disease

## 2012-07-17 ENCOUNTER — Ambulatory Visit (INDEPENDENT_AMBULATORY_CARE_PROVIDER_SITE_OTHER): Payer: Medicare Other | Admitting: Cardiovascular Disease

## 2012-07-17 VITALS — BP 120/78 | HR 75 | Ht 69.0 in | Wt 200.1 lb

## 2012-07-17 DIAGNOSIS — I2510 Atherosclerotic heart disease of native coronary artery without angina pectoris: Secondary | ICD-10-CM

## 2012-07-17 DIAGNOSIS — E785 Hyperlipidemia, unspecified: Secondary | ICD-10-CM

## 2012-07-17 NOTE — Progress Notes (Signed)
[name] [name] Date of Birth  [birth year] [facility] [address]     [address] [place], [place]  [address] [phone]  Fax  [phone]  Problem list: 1. Coronary artery disease-status post PTCA and stenting of his left circumflex artery and right coronary artery 2. Hyperlipidemia  History of Present Illness:  71 year old gentleman with a history of coronary artery disease. He status post PTCA and stenting of the left circumflex artery as well as PTCA of  the posterior descending artery and January 2002. He's done very well. He has not had any episodes of chest pain or shortness of breath.   Current Outpatient Prescriptions on File Prior to Visit  Medication Sig Dispense Refill  . aspirin 325 MG tablet Take 325 mg by mouth daily.        [name] atorvastatin (LIPITOR) 40 MG tablet Take 1 tablet (40 mg total) by mouth daily.  90 tablet  3  . Calcium Carbonate-Vitamin D (CALCIUM + D PO) Take by mouth daily.        . metoprolol (LOPRESSOR) 50 MG tablet Take 0.5 tablets (25 mg total) by mouth 2 (two) times daily.  180 tablet  3  . Multiple Vitamin (MULTIVITAMIN PO) Take by mouth daily.        . niacin (NIASPAN) 1000 MG CR tablet Take 1,000 mg by mouth daily.        . Omega-3 Fatty Acids (FISH OIL) 1200 MG CAPS Take 1,200 mg by mouth daily.        [name] omeprazole (PRILOSEC) 20 MG capsule Take 20 mg by mouth daily.          Allergies  Allergen Reactions  . Simvastatin Other (See Comments)    Leg pain     Past Medical History  Diagnosis Date  . Hyperlipidemia   . Coronary artery disease     status post PTCA and stenting of his left circumflex artery and right coronary artery  . Reflux     Hx Gastroesophageal    Past Surgical History  Procedure Date  . Cystectomy     removed from finger  . Benign cyst     on back  . Cardiovascular stress test 05-09-2007    EF 64%    History  Smoking status  . Never Smoker   Smokeless tobacco  .  Not on file    History  Alcohol Use No    No family history on file.  Reviw of Systems:  Reviewed in the HPI.  All other systems are negative.  Physical Exam: BP 120/78  Pulse 75  Ht 5\' 9"  (1.753 m)  Wt 200 lb 1.9 oz (90.774 kg)  BMI 29.55 kg/m2  SpO2 97% The patient is alert and oriented x 3.  The mood and affect are normal.   Skin: warm and dry.  Color is normal.    HEENT:   the sclera are nonicteric.  The mucous membranes are moist.  The carotids are 2+ without bruits.  There is no thyromegaly.  There is no JVD.    Lungs: clear.  The chest wall is non tender.    Heart: regular rate with a normal S1 and S2.  There are no murmurs, gallops, or rubs. The PMI is not displaced.     Abdomen: good bowel sounds.  There is no guarding or rebound.  There is no hepatosplenomegaly or tenderness.  There are no masses.   Extremities:  no  clubbing, cyanosis, or edema.  The legs are without rashes.  The distal pulses are intact.   Neuro:  Cranial nerves II - XII are intact.  Motor and sensory functions are intact.    The gait is normal.  ECG: 01/17/2012-normal sinus rhythm. Normal EKG. Assessment / Plan:

## 2012-07-17 NOTE — Assessment & Plan Note (Signed)
His most recent lipid numbers are okay. His HDL is still a little of. I have encouraged him to increase his aerobic exercise. He will continue with the over-the-counter niacin.

## 2012-07-17 NOTE — Patient Instructions (Addendum)
Your physician wants you to follow-up in: 6 months  You will receive a reminder letter in the mail two months in advance. If you don't receive a letter, please call our office to schedule the follow-up appointment.  Your physician recommends that you return for a FASTING lipid profile: 6 months   

## 2012-07-17 NOTE — Assessment & Plan Note (Signed)
Pt is doing well.  Continue current meds.  His cholesterol is OK.  HDL is still a bit low. He should continue with his current medications. I'll see him again in 6 months. We'll check fasting labs at that time.

## 2012-07-24 ENCOUNTER — Encounter: Payer: Self-pay | Admitting: Cardiovascular Disease

## 2012-10-25 ENCOUNTER — Other Ambulatory Visit: Payer: Self-pay | Admitting: *Deleted

## 2012-10-25 DIAGNOSIS — E785 Hyperlipidemia, unspecified: Secondary | ICD-10-CM

## 2012-10-25 DIAGNOSIS — I2510 Atherosclerotic heart disease of native coronary artery without angina pectoris: Secondary | ICD-10-CM

## 2012-10-25 NOTE — Progress Notes (Signed)
Fasting labs ordered

## 2013-01-09 ENCOUNTER — Other Ambulatory Visit (INDEPENDENT_AMBULATORY_CARE_PROVIDER_SITE_OTHER): Payer: Medicare Other

## 2013-01-09 DIAGNOSIS — E785 Hyperlipidemia, unspecified: Secondary | ICD-10-CM

## 2013-01-09 DIAGNOSIS — I2510 Atherosclerotic heart disease of native coronary artery without angina pectoris: Secondary | ICD-10-CM

## 2013-01-09 LAB — HEPATIC FUNCTION PANEL
ALT: 22 U/L (ref 0–53)
AST: 20 U/L (ref 0–37)
Albumin: 3.9 g/dL (ref 3.5–5.2)
Alkaline Phosphatase: 61 U/L (ref 39–117)
Bilirubin, Direct: 0 mg/dL (ref 0.0–0.3)
Total Bilirubin: 0.6 mg/dL (ref 0.3–1.2)
Total Protein: 6.9 g/dL (ref 6.0–8.3)

## 2013-01-09 LAB — LIPID PANEL
Cholesterol: 95 mg/dL (ref 0–200)
HDL: 32 mg/dL — ABNORMAL LOW (ref >39.00)
LDL Cholesterol: 50 mg/dL (ref 0–99)
Total CHOL/HDL Ratio: 3
Triglycerides: 65 mg/dL (ref 0.0–149.0)
VLDL: 13 mg/dL (ref 0.0–40.0)

## 2013-01-09 LAB — BASIC METABOLIC PANEL
BUN: 19 mg/dL (ref 6–23)
CO2: 27 mEq/L (ref 19–32)
Calcium: 9 mg/dL (ref 8.4–10.5)
Chloride: 103 mEq/L (ref 96–112)
Creatinine, Ser: 0.8 mg/dL (ref 0.4–1.5)
GFR: 100.25 mL/min (ref >60.00)
Glucose, Bld: 89 mg/dL (ref 70–99)
Potassium: 4 mEq/L (ref 3.5–5.1)
Sodium: 137 mEq/L (ref 135–145)

## 2013-01-16 ENCOUNTER — Encounter: Payer: Self-pay | Admitting: Cardiovascular Disease

## 2013-01-16 ENCOUNTER — Ambulatory Visit (INDEPENDENT_AMBULATORY_CARE_PROVIDER_SITE_OTHER): Payer: Medicare Other | Admitting: Cardiovascular Disease

## 2013-01-16 VITALS — BP 132/76 | HR 75 | Ht 69.0 in | Wt 198.2 lb

## 2013-01-16 DIAGNOSIS — I2510 Atherosclerotic heart disease of native coronary artery without angina pectoris: Secondary | ICD-10-CM

## 2013-01-16 DIAGNOSIS — E785 Hyperlipidemia, unspecified: Secondary | ICD-10-CM

## 2013-01-16 NOTE — Assessment & Plan Note (Signed)
Stable, continue atorvastatin 40 mg a day.

## 2013-01-16 NOTE — Patient Instructions (Signed)
Your physician wants you to follow-up in: 1 YEAR WITH EKG You will receive a reminder letter in the mail two months in advance. If you don't receive a letter, please call our office to schedule the follow-up appointment.  Your physician recommends that you return for a FASTING lipid profile: 1 YEAR  Your physician recommends that you continue on your current medications as directed. Please refer to the Current Medication list given to you today.

## 2013-01-16 NOTE — Progress Notes (Signed)
[name] [name] Date of Birth  Feb 03, 1941 [facility] [address]     [address] [place], [place]  [address] [phone]  Fax  [phone]  Problem list: 1. Coronary artery disease-status post PTCA and stenting of his left circumflex artery and right coronary artery 2. Hyperlipidemia  History of Present Illness:  72 year old gentleman with a history of coronary artery disease. He status post PTCA and stenting of the left circumflex artery as well as PTCA of  the posterior descending artery and January 2002. He's done very well. He has not had any episodes of chest pain or shortness of breath.  January 16, 2014:  [name] [name] feels well - no angina, no dyspnea.    He is watching his diet.    Current Outpatient Prescriptions on File Prior to Visit  Medication Sig Dispense Refill  . aspirin 325 MG tablet Take 325 mg by mouth daily.        [name] atorvastatin (LIPITOR) 40 MG tablet Take 1 tablet (40 mg total) by mouth daily.  90 tablet  3  . Calcium Carbonate-Vitamin D (CALCIUM + D PO) Take by mouth daily.        . metoprolol (LOPRESSOR) 50 MG tablet Take 0.5 tablets (25 mg total) by mouth 2 (two) times daily.  180 tablet  3  . Multiple Vitamin (MULTIVITAMIN PO) Take by mouth daily.        . niacin (NIASPAN) 1000 MG CR tablet Take 1,000 mg by mouth daily.        . Omega-3 Fatty Acids (FISH OIL) 1200 MG CAPS Take 1,000 mg by mouth daily.       [name] omeprazole (PRILOSEC) 20 MG capsule Take 20 mg by mouth daily.         No current facility-administered medications on file prior to visit.    Allergies  Allergen Reactions  . Simvastatin Other (See Comments)    Leg pain     Past Medical History  Diagnosis Date  . Hyperlipidemia   . Coronary artery disease     status post PTCA and stenting of his left circumflex artery and right coronary artery  . Reflux     Hx Gastroesophageal    Past Surgical History  Procedure Laterality Date  . Cystectomy       removed from finger  . Benign cyst      on back  . Cardiovascular stress test  05-09-2007    EF 64%    History  Smoking status  . Never Smoker   Smokeless tobacco  . Not on file    History  Alcohol Use No    No family history on file.  Reviw of Systems:  Reviewed in the HPI.  All other systems are negative.  Physical Exam: BP 132/76  Pulse 75  Ht 5\' 9"  (1.753 m)  Wt 198 lb 3.2 oz (89.903 kg)  BMI 29.26 kg/m2 The patient is alert and oriented x 3.  The mood and affect are normal.   Skin: warm and dry.  Color is normal.    HEENT:   the sclera are nonicteric.  The mucous membranes are moist.  The carotids are 2+ without bruits.  There is no thyromegaly.  There is no JVD.    Lungs: clear.  The chest wall is non tender.    Heart: regular rate with a normal S1 and S2.  There are no murmurs, gallops, or rubs. The PMI is not  displaced.     Abdomen: good bowel sounds.  There is no guarding or rebound.  There is no hepatosplenomegaly or tenderness.  There are no masses.   Extremities:  no clubbing, cyanosis, or edema.  The legs are without rashes.  The distal pulses are intact.   Neuro:  Cranial nerves II - XII are intact.  Motor and sensory functions are intact.    The gait is normal.  ECG: 01/17/2012-normal sinus rhythm at 77. Old  Inferior MI Assessment / Plan:

## 2013-01-16 NOTE — Assessment & Plan Note (Signed)
Jameson is doing well. He's not had any episodes of chest. His LDL is 50. I've encouraged him to continue to walk on a regular basis.

## 2013-03-26 ENCOUNTER — Other Ambulatory Visit: Payer: Self-pay | Admitting: *Deleted

## 2013-03-26 MED ORDER — ATORVASTATIN CALCIUM 40 MG PO TABS: 40.0000 mg | ORAL_TABLET | Freq: Every day | ORAL | Status: DC

## 2013-03-26 NOTE — Telephone Encounter (Signed)
Fax Received. Refill Completed. [name] [name] (R.M.A)   

## 2013-06-21 ENCOUNTER — Other Ambulatory Visit: Payer: Self-pay

## 2013-06-21 MED ORDER — METOPROLOL TARTRATE 50 MG PO TABS: 25.0000 mg | ORAL_TABLET | Freq: Two times a day (BID) | ORAL | Status: DC

## 2013-10-08 ENCOUNTER — Other Ambulatory Visit: Payer: Self-pay

## 2013-10-08 ENCOUNTER — Telehealth: Payer: Self-pay | Admitting: Cardiovascular Disease

## 2013-10-08 MED ORDER — METOPROLOL TARTRATE 50 MG PO TABS: 25.0000 mg | ORAL_TABLET | Freq: Two times a day (BID) | ORAL | Status: DC

## 2013-10-08 MED ORDER — METOPROLOL TARTRATE 25 MG PO TABS: 25.0000 mg | ORAL_TABLET | Freq: Two times a day (BID) | ORAL | Status: DC

## 2013-10-08 MED ORDER — ATORVASTATIN CALCIUM 40 MG PO TABS: 40.0000 mg | ORAL_TABLET | Freq: Every day | ORAL | Status: DC

## 2013-10-08 NOTE — Telephone Encounter (Signed)
New message     Talk to nurse about changing medication milligram for metoprolol.

## 2013-10-08 NOTE — Telephone Encounter (Signed)
Pt wants metoprolol 25 mg bid/ done.

## 2014-01-14 ENCOUNTER — Ambulatory Visit (INDEPENDENT_AMBULATORY_CARE_PROVIDER_SITE_OTHER): Payer: Medicare HMO | Admitting: *Deleted

## 2014-01-14 DIAGNOSIS — E785 Hyperlipidemia, unspecified: Secondary | ICD-10-CM

## 2014-01-14 DIAGNOSIS — I2510 Atherosclerotic heart disease of native coronary artery without angina pectoris: Secondary | ICD-10-CM

## 2014-01-14 LAB — HEPATIC FUNCTION PANEL
ALT: 25 U/L (ref 0–53)
AST: 23 U/L (ref 0–37)
Albumin: 3.9 g/dL (ref 3.5–5.2)
Alkaline Phosphatase: 62 U/L (ref 39–117)
Bilirubin, Direct: 0.1 mg/dL (ref 0.0–0.3)
Total Bilirubin: 0.6 mg/dL (ref 0.3–1.2)
Total Protein: 7 g/dL (ref 6.0–8.3)

## 2014-01-14 LAB — CBC WITH DIFFERENTIAL/PLATELET
Basophils Absolute: 0 10*3/uL (ref 0.0–0.1)
Basophils Relative: 0.6 % (ref 0.0–3.0)
Eosinophils Absolute: 0.2 10*3/uL (ref 0.0–0.7)
Eosinophils Relative: 3.6 % (ref 0.0–5.0)
HCT: 44.3 % (ref 39.0–52.0)
Hemoglobin: 14.9 g/dL (ref 13.0–17.0)
Lymphocytes Relative: 27.4 % (ref 12.0–46.0)
Lymphs Abs: 1.7 10*3/uL (ref 0.7–4.0)
MCHC: 33.8 g/dL (ref 30.0–36.0)
MCV: 90.7 fl (ref 78.0–100.0)
Monocytes Absolute: 0.7 10*3/uL (ref 0.1–1.0)
Monocytes Relative: 11.2 % (ref 3.0–12.0)
Neutro Abs: 3.6 10*3/uL (ref 1.4–7.7)
Neutrophils Relative %: 57.2 % (ref 43.0–77.0)
Platelets: 233 10*3/uL (ref 150.0–400.0)
RBC: 4.88 Mil/uL (ref 4.22–5.81)
RDW: 13.5 % (ref 11.5–14.6)
WBC: 6.3 10*3/uL (ref 4.5–10.5)

## 2014-01-14 LAB — LIPID PANEL
Cholesterol: 100 mg/dL (ref 0–200)
HDL: 37.1 mg/dL — ABNORMAL LOW (ref >39.00)
LDL Cholesterol: 53 mg/dL (ref 0–99)
Total CHOL/HDL Ratio: 3
Triglycerides: 48 mg/dL (ref 0.0–149.0)
VLDL: 9.6 mg/dL (ref 0.0–40.0)

## 2014-01-14 LAB — BASIC METABOLIC PANEL
BUN: 17 mg/dL (ref 6–23)
CO2: 28 mEq/L (ref 19–32)
Calcium: 9.2 mg/dL (ref 8.4–10.5)
Chloride: 103 mEq/L (ref 96–112)
Creatinine, Ser: 0.8 mg/dL (ref 0.4–1.5)
GFR: 102.89 mL/min (ref >60.00)
Glucose, Bld: 89 mg/dL (ref 70–99)
Potassium: 4.4 mEq/L (ref 3.5–5.1)
Sodium: 137 mEq/L (ref 135–145)

## 2014-01-16 ENCOUNTER — Ambulatory Visit (INDEPENDENT_AMBULATORY_CARE_PROVIDER_SITE_OTHER): Payer: Medicare HMO | Admitting: Cardiovascular Disease

## 2014-01-16 ENCOUNTER — Encounter: Payer: Self-pay | Admitting: Cardiovascular Disease

## 2014-01-16 VITALS — BP 118/80 | HR 77 | Ht 69.0 in | Wt 202.1 lb

## 2014-01-16 DIAGNOSIS — I2510 Atherosclerotic heart disease of native coronary artery without angina pectoris: Secondary | ICD-10-CM

## 2014-01-16 DIAGNOSIS — E785 Hyperlipidemia, unspecified: Secondary | ICD-10-CM

## 2014-01-16 MED ORDER — ASPIRIN 81 MG PO TABS: 81.0000 mg | ORAL_TABLET | Freq: Every day | ORAL | Status: DC

## 2014-01-16 NOTE — Progress Notes (Signed)
[name] [name] Date of Birth  [birth year] [facility] / [facility] [address]     [address] [place], [place]  [id number] [phone]  Fax  [phone]  Problem list: 1. Coronary artery disease-status post PTCA and stenting of his left circumflex artery and right coronary artery 2. Hyperlipidemia  History of Present Illness:  [age] year old gentleman with a history of coronary artery disease. He status post PTCA and stenting of the left circumflex artery as well as PTCA of  the posterior descending artery and January 2002. He's done very well. He has not had any episodes of chest pain or shortness of breath.  January 16, 2014:  [name] [name] feels well - no angina, no dyspnea.    He is watching his diet.    Current Outpatient Prescriptions on File Prior to Visit  Medication Sig Dispense Refill  . aspirin 325 MG tablet Take 325 mg by mouth daily.        [name]. atorvastatin (LIPITOR) 40 MG tablet Take 1 tablet (40 mg total) by mouth daily.  90 tablet  3  . Calcium Carbonate-Vitamin D (CALCIUM + D PO) Take by mouth daily.        [name]. glucosamine-chondroitin 500-400 MG tablet Take 1 tablet by mouth 2 (two) times daily.      . metoprolol (LOPRESSOR) 25 MG tablet Take 1 tablet (25 mg total) by mouth 2 (two) times daily.  180 tablet  3  . Multiple Vitamin (MULTIVITAMIN PO) Take by mouth daily.        . niacin (NIASPAN) 1000 MG CR tablet Take 1,000 mg by mouth daily.        . Omega-3 Fatty Acids (FISH OIL) 1200 MG CAPS Take 1,000 mg by mouth daily.       [name]. omeprazole (PRILOSEC) 20 MG capsule Take 20 mg by mouth daily.         No current facility-administered medications on file prior to visit.    Allergies  Allergen Reactions  . Simvastatin Other (See Comments)    Leg pain     Past Medical History  Diagnosis Date  . Hyperlipidemia   . Coronary artery disease     status post PTCA and stenting of his left circumflex artery and right coronary artery  . Reflux     Hx  Gastroesophageal    Past Surgical History  Procedure Laterality Date  . Cystectomy      removed from finger  . Benign cyst      on back  . Cardiovascular stress test  05-09-2007    EF 64%    History  Smoking status  . Never Smoker   Smokeless tobacco  . Not on file    History  Alcohol Use No    No family history on file.  Reviw of Systems:  Reviewed in the HPI.  All other systems are negative.  Physical Exam: BP 118/80  Pulse 77  Ht 5\' 9"  (1.753 m)  Wt 202 lb 1.9 oz (91.681 kg)  BMI 29.83 kg/m2 The patient is alert and oriented x 3.  The mood and affect are normal.   Skin: warm and dry.  Color is normal.    HEENT:   the sclera are nonicteric.  The mucous membranes are moist.  The carotids are 2+ without bruits.  There is no thyromegaly.  There is no JVD.    Lungs: clear.  The chest wall is non tender.    Heart: regular  rate with a normal S1 and S2.  There are no murmurs, gallops, or rubs. The PMI is not displaced.     Abdomen: good bowel sounds.  There is no guarding or rebound.  There is no hepatosplenomegaly or tenderness.  There are no masses.   Extremities:  no clubbing, cyanosis, or edema.  The legs are without rashes.  The distal pulses are intact.   Neuro:  Cranial nerves II - XII are intact.  Motor and sensory functions are intact.    The gait is normal.  ECG: April 15 ,2015:  NSR at 5277 with PVCs , old inf. MI Assessment / Plan:

## 2014-01-16 NOTE — Patient Instructions (Addendum)
Your physician has recommended you make the following change in your medication:  DECREASE Aspirin to 81 mg once daily  Your physician recommends that you return for lab work (fasting cholesterol/liver/BMET)  in: 1 year before you see Dr. Elease HashimotoNahser.  Do not eat or drink anything except water after midnight the night before this test.   Your physician wants you to follow-up in: 1 year with Dr. Melburn PopperNasher.  You will receive a reminder letter in the mail two months in advance. If you don't receive a letter, please call our office to schedule the follow-up appointment.

## 2014-01-16 NOTE — Assessment & Plan Note (Signed)
[name] [name]  is doing very well. He's not having episodes of chest pain or shortness of breath. His lipids looked quite good.  I've encouraged him to continue with a good diet and exercise program. I'll see him again in one year for followup office visit with fasting lipids, liver enzymes, and basic metabolic profile.

## 2014-04-29 ENCOUNTER — Ambulatory Visit: Payer: Medicare HMO | Attending: Family Medicine | Admitting: Physical Therapy

## 2014-04-29 DIAGNOSIS — IMO0001 Reserved for inherently not codable concepts without codable children: Secondary | ICD-10-CM | POA: Diagnosis not present

## 2014-04-29 DIAGNOSIS — Z9889 Other specified postprocedural states: Secondary | ICD-10-CM | POA: Insufficient documentation

## 2014-04-29 DIAGNOSIS — M25569 Pain in unspecified knee: Secondary | ICD-10-CM | POA: Insufficient documentation

## 2014-05-03 ENCOUNTER — Ambulatory Visit: Payer: Medicare HMO | Admitting: Physical Therapy

## 2014-05-03 DIAGNOSIS — IMO0001 Reserved for inherently not codable concepts without codable children: Secondary | ICD-10-CM | POA: Diagnosis not present

## 2014-05-06 ENCOUNTER — Ambulatory Visit: Payer: Medicare HMO | Attending: Family Medicine | Admitting: Physical Therapy

## 2014-05-06 DIAGNOSIS — M25569 Pain in unspecified knee: Secondary | ICD-10-CM | POA: Diagnosis not present

## 2014-05-06 DIAGNOSIS — Z9889 Other specified postprocedural states: Secondary | ICD-10-CM | POA: Insufficient documentation

## 2014-05-06 DIAGNOSIS — IMO0001 Reserved for inherently not codable concepts without codable children: Secondary | ICD-10-CM | POA: Diagnosis not present

## 2014-05-08 ENCOUNTER — Ambulatory Visit: Payer: Medicare HMO | Admitting: Physical Therapy

## 2014-05-09 ENCOUNTER — Ambulatory Visit: Payer: Medicare HMO | Admitting: Physical Therapy

## 2014-05-09 DIAGNOSIS — IMO0001 Reserved for inherently not codable concepts without codable children: Secondary | ICD-10-CM | POA: Diagnosis not present

## 2014-09-05 ENCOUNTER — Other Ambulatory Visit: Payer: Self-pay | Admitting: Cardiovascular Disease

## 2015-01-21 ENCOUNTER — Other Ambulatory Visit (INDEPENDENT_AMBULATORY_CARE_PROVIDER_SITE_OTHER): Payer: Medicare HMO

## 2015-01-21 ENCOUNTER — Encounter: Payer: Self-pay | Admitting: Cardiovascular Disease

## 2015-01-21 ENCOUNTER — Ambulatory Visit (INDEPENDENT_AMBULATORY_CARE_PROVIDER_SITE_OTHER): Payer: Medicare HMO | Admitting: Cardiovascular Disease

## 2015-01-21 VITALS — BP 118/80 | HR 71 | Ht 69.0 in | Wt 204.8 lb

## 2015-01-21 DIAGNOSIS — E785 Hyperlipidemia, unspecified: Secondary | ICD-10-CM

## 2015-01-21 DIAGNOSIS — I2510 Atherosclerotic heart disease of native coronary artery without angina pectoris: Secondary | ICD-10-CM | POA: Diagnosis not present

## 2015-01-21 LAB — BASIC METABOLIC PANEL
BUN: 21 mg/dL (ref 6–23)
CO2: 28 mEq/L (ref 19–32)
Calcium: 9.9 mg/dL (ref 8.4–10.5)
Chloride: 105 mEq/L (ref 96–112)
Creatinine, Ser: 0.83 mg/dL (ref 0.40–1.50)
GFR: 96.9 mL/min (ref >60.00)
Glucose, Bld: 92 mg/dL (ref 70–99)
Potassium: 4.3 mEq/L (ref 3.5–5.1)
Sodium: 137 mEq/L (ref 135–145)

## 2015-01-21 LAB — LIPID PANEL
Cholesterol: 120 mg/dL (ref 0–200)
HDL: 37.7 mg/dL — ABNORMAL LOW (ref >39.00)
LDL Cholesterol: 68 mg/dL (ref 0–99)
NonHDL: 82.3
Total CHOL/HDL Ratio: 3
Triglycerides: 73 mg/dL (ref 0.0–149.0)
VLDL: 14.6 mg/dL (ref 0.0–40.0)

## 2015-01-21 LAB — HEPATIC FUNCTION PANEL
ALT: 30 U/L (ref 0–53)
AST: 23 U/L (ref 0–37)
Albumin: 4.3 g/dL (ref 3.5–5.2)
Alkaline Phosphatase: 67 U/L (ref 39–117)
Bilirubin, Direct: 0.1 mg/dL (ref 0.0–0.3)
Total Bilirubin: 0.7 mg/dL (ref 0.2–1.2)
Total Protein: 7.3 g/dL (ref 6.0–8.3)

## 2015-01-21 NOTE — Patient Instructions (Signed)
Medication Instructions:  Your physician recommends that you continue on your current medications as directed. Please refer to the Current Medication list given to you today.   Labwork: Your physician recommends that you have fasting lab work today:  Cholesterol, liver, basic metabolic panel  Your physician recommends that you return for lab work in: 1 year on the day of or a few days before your office visit with Dr. Elease HashimotoNahser.  You will need to FAST for this appointment - nothing to eat or drink after midnight the night before except water.   Testing/Procedures: None  Follow-Up: Your physician wants you to follow-up in: 1 year with Dr. Elease HashimotoNahser.  You will receive a reminder letter in the mail two months in advance. If you don't receive a letter, please call our office to schedule the follow-up appointment.

## 2015-01-21 NOTE — Progress Notes (Signed)
Cardiology Office Note   Date:  01/21/2015   ID:  [name] StanleyJack G Kuras, DOB [birth year], MRN [id number]  PCP:  Delorse LekBURNETT,BRENT A, MD  Cardiologist:   Vesta MixerNahser, Emmalina Espericueta J, MD   Chief Complaint  Patient presents with  . Coronary Artery Disease   1. Coronary artery disease-status post PTCA and stenting of his left circumflex artery and right coronary artery 2. Hyperlipidemia  History of Present Illness:  74 year old gentleman with a history of coronary artery disease. He status post PTCA and stenting of the left circumflex artery as well as PTCA of the posterior descending artery and January 2002. He's done very well. He has not had any episodes of chest pain or shortness of breath.  January 16, 2014:  [name] [name] feels well - no angina, no dyspnea. He is watching his diet   January 21, 2015:  [name] [name] is a 74 y.o. male who presents for follow-up of his coronary artery disease. Still bowling  [facility]  Does some cardio.    Past Medical History  Diagnosis Date  . Hyperlipidemia   . Coronary artery disease     status post PTCA and stenting of his left circumflex artery and right coronary artery  . Reflux     Hx Gastroesophageal    Past Surgical History  Procedure Laterality Date  . Cystectomy      removed from finger  . Benign cyst      on back  . Cardiovascular stress test  05-09-2007    EF 64%     Current Outpatient Prescriptions  Medication Sig Dispense Refill  . aspirin 81 MG tablet Take 1 tablet (81 mg total) by mouth daily.    [name]. atorvastatin (LIPITOR) 40 MG tablet TAKE 1 TABLET EVERY DAY 90 tablet 3  . Calcium Carbonate-Vitamin D (CALCIUM + D PO) Take by mouth daily.      [name]. glucosamine-chondroitin 500-400 MG tablet Take 1 tablet by mouth 2 (two) times daily.    . metoprolol tartrate (LOPRESSOR) 25 MG tablet TAKE 1 TABLET TWICE DAILY 180 tablet 3  . Multiple Vitamin (MULTIVITAMIN PO) Take by mouth daily.      . niacin (NIASPAN) 1000 MG CR tablet Take  1,000 mg by mouth daily.      . Omega-3 Fatty Acids (FISH OIL) 1200 MG CAPS Take 1,000 mg by mouth daily.     [name]. omeprazole (PRILOSEC) 20 MG capsule Take 20 mg by mouth daily.       No current facility-administered medications for this visit.    Allergies:   Simvastatin    Social History:  The patient  reports that he has never smoked. He does not have any smokeless tobacco history on file. He reports that he does not drink alcohol.   Family History:  The patient's family history includes Heart attack in his father.    ROS:  Please see the history of present illness.    Review of Systems: Constitutional:  denies fever, chills, diaphoresis, appetite change and fatigue.  HEENT: denies photophobia, eye pain, redness, hearing loss, ear pain, congestion, sore throat, rhinorrhea, sneezing, neck pain, neck stiffness and tinnitus.  Respiratory: denies SOB, DOE, cough, chest tightness, and wheezing.  Cardiovascular: denies chest pain, palpitations and leg swelling.  Gastrointestinal: denies nausea, vomiting, abdominal pain, diarrhea, constipation, blood in stool.  Genitourinary: denies dysuria, urgency, frequency, hematuria, flank pain and difficulty urinating.  Musculoskeletal: denies  myalgias, back pain, joint swelling, arthralgias and gait problem.  Skin: denies pallor, rash and wound.  Neurological: denies dizziness, seizures, syncope, weakness, light-headedness, numbness and headaches.   Hematological: denies adenopathy, easy bruising, personal or family bleeding history.  Psychiatric/ Behavioral: denies suicidal ideation, mood changes, confusion, nervousness, sleep disturbance and agitation.       All other systems are reviewed and negative.    PHYSICAL EXAM: VS:  BP 118/80 mmHg  Pulse 71  Ht  (1.753 m)  Wt 204 lb 12.8 oz (92.897 kg)  BMI 30.23 kg/m2 , BMI Body mass index is 30.23 kg/(m^2). GEN: Well nourished, well developed, in no acute distress HEENT: normal Neck:  no JVD, carotid bruits, or masses Cardiac: RRR; no murmurs, rubs, or gallops,no edema  Respiratory:  clear to auscultation bilaterally, normal work of breathing GI: soft, nontender, nondistended, + BS MS: no deformity or atrophy Skin: warm and dry, no rash Neuro:  Strength and sensation are intact Psych: normal   EKG:  EKG is ordered today. The ekg ordered today demonstrates NSR at 71. Early repol.     Recent Labs: No results found for requested labs within last 365 days.    Lipid Panel    Component Value Date/Time   CHOL 100 01/14/2014 0959   TRIG 48.0 01/14/2014 0959   HDL 37.10* 01/14/2014 0959   CHOLHDL 3 01/14/2014 0959   VLDL 9.6 01/14/2014 0959   LDLCALC 53 01/14/2014 0959   LDLDIRECT 152.0 03/19/2011 0831      Wt Readings from Last 3 Encounters:  01/21/15 204 lb 12.8 oz (92.897 kg)  01/16/14 202 lb 1.9 oz (91.681 kg)  01/16/13 198 lb 3.2 oz (89.903 kg)      Other studies Reviewed: Additional studies/ records that were reviewed today include: . Review of the above records demonstrates:    ASSESSMENT AND PLAN:  1. Coronary artery disease-status post PTCA and stenting of his left circumflex artery and right coronary artery- he's doing very well. Is not having any episodes of angina. We'll continue to treat his lipids aggressively. We'll check fasting lipids today.  2. Hyperlipidemia - check fasting lipids today.  3. Leg pain. He occasionally has some leg burning when he is walking up until. He typically does not have any leg pain when he's exercising at the [facility]. We discussed the possibility that he may have some peripheral vascular disease. He will give [place] a call if the pains worsen.   Current medicines are reviewed at length with the patient today.  The patient does not have concerns regarding medicines.  The following changes have been made:  no change  Labs/ tests ordered today include:  No orders of the defined types were placed in this encounter.       Disposition:   FU with me in 1 year      Signed, Rondrick Barreira, Deloris Ping, MD  01/21/2015 9:01 AM    [facility] [address] [place], [place], [place]  [address] Phone: [phone]; Fax: [phone]

## 2015-06-23 ENCOUNTER — Other Ambulatory Visit: Payer: Self-pay | Admitting: Cardiovascular Disease

## 2016-01-21 ENCOUNTER — Encounter: Payer: Self-pay | Admitting: Cardiovascular Disease

## 2016-01-21 ENCOUNTER — Other Ambulatory Visit: Payer: Medicare HMO

## 2016-01-21 ENCOUNTER — Ambulatory Visit (INDEPENDENT_AMBULATORY_CARE_PROVIDER_SITE_OTHER): Payer: Medicare HMO | Admitting: Cardiovascular Disease

## 2016-01-21 VITALS — BP 112/84 | HR 72 | Ht 69.0 in | Wt 202.8 lb

## 2016-01-21 DIAGNOSIS — I2510 Atherosclerotic heart disease of native coronary artery without angina pectoris: Secondary | ICD-10-CM | POA: Diagnosis not present

## 2016-01-21 DIAGNOSIS — E785 Hyperlipidemia, unspecified: Secondary | ICD-10-CM

## 2016-01-21 LAB — HEPATIC FUNCTION PANEL
ALT: 24 U/L (ref 9–46)
AST: 20 U/L (ref 10–35)
Albumin: 4.2 g/dL (ref 3.6–5.1)
Alkaline Phosphatase: 54 U/L (ref 40–115)
Bilirubin, Direct: 0.1 mg/dL (ref <=0.2)
Indirect Bilirubin: 0.5 mg/dL (ref 0.2–1.2)
Total Bilirubin: 0.6 mg/dL (ref 0.2–1.2)
Total Protein: 6.8 g/dL (ref 6.1–8.1)

## 2016-01-21 LAB — BASIC METABOLIC PANEL
BUN: 15 mg/dL (ref 7–25)
CO2: 27 mmol/L (ref 20–31)
Calcium: 9.1 mg/dL (ref 8.6–10.3)
Chloride: 104 mmol/L (ref 98–110)
Creat: 0.84 mg/dL (ref 0.70–1.18)
Glucose, Bld: 96 mg/dL (ref 65–99)
Potassium: 4.5 mmol/L (ref 3.5–5.3)
Sodium: 140 mmol/L (ref 135–146)

## 2016-01-21 LAB — LIPID PANEL
Cholesterol: 100 mg/dL — ABNORMAL LOW (ref 125–200)
HDL: 35 mg/dL — ABNORMAL LOW (ref >=40)
LDL Cholesterol: 55 mg/dL (ref <130)
Total CHOL/HDL Ratio: 2.9 Ratio (ref <=5.0)
Triglycerides: 52 mg/dL (ref <150)
VLDL: 10 mg/dL (ref <30)

## 2016-01-21 NOTE — Patient Instructions (Signed)
Medication Instructions:  Your physician recommends that you continue on your current medications as directed. Please refer to the Current Medication list given to you today.   Labwork: TODAY - cholesterol, complete metabolic panel  Your physician recommends that you return for lab work in: 1 year on the day of or a few days before your office visit with Dr. Nahser.  You will need to FAST for this appointment - nothing to eat or drink after midnight the night before except water.    Testing/Procedures: None Ordered   Follow-Up: Your physician wants you to follow-up in: 1 year with Dr. Nahser.  You will receive a reminder letter in the mail two months in advance. If you don't receive a letter, please call our office to schedule the follow-up appointment.   If you need a refill on your cardiac medications before your next appointment, please call your pharmacy.   Thank you for choosing [facility]! Areonna Bran, RN [phone]    

## 2016-01-21 NOTE — Progress Notes (Signed)
Cardiology Office Note   Date:  01/21/2016   ID:  [name] [name], DOB [birth year], MRN [id number]  PCP:  Delorse LekBURNETT,BRENT A, MD  Cardiologist:   Vesta MixerNahser, Murel Wigle J, MD   Chief Complaint  Patient presents with  . Follow-up    CAD   1. Coronary artery disease-status post PTCA and stenting of his left circumflex artery and right coronary artery 2. Hyperlipidemia  History of Present Illness:  75 year old gentleman with a history of coronary artery disease. He status post PTCA and stenting of the left circumflex artery as well as PTCA of the posterior descending artery and January 2002. He's done very well. He has not had any episodes of chest pain or shortness of breath.  January 16, 2014:  [name] [name] feels well - no angina, no dyspnea. He is watching his diet   January 21, 2015:  [name] [name] is a 75 y.o. male who presents for follow-up of his coronary artery disease. Still bowling  [facility].  Does some cardio.   January 21, 2016:  [name] [name] is seen today for his CAD.    Has some muscle stiffness.  No muscle aches.  Has done some stretches.    Past Medical History  Diagnosis Date  . Hyperlipidemia   . Coronary artery disease     status post PTCA and stenting of his left circumflex artery and right coronary artery  . Reflux     Hx Gastroesophageal    Past Surgical History  Procedure Laterality Date  . Cystectomy      removed from finger  . Benign cyst      on back  . Cardiovascular stress test  05-09-2007    EF 64%     Current Outpatient Prescriptions  Medication Sig Dispense Refill  . aspirin 81 MG tablet Take 1 tablet (81 mg total) by mouth daily.    [name]. atorvastatin (LIPITOR) 40 MG tablet TAKE 1 TABLET EVERY DAY (Patient taking differently: TAKE 1 TABLET BY MOUTH  EVERY DAY) 90 tablet 3  . Calcium Carbonate-Vitamin D (CALCIUM + D PO) Take 1 tablet by mouth daily.     [name]. glucosamine-chondroitin 500-400 MG tablet Take 1 tablet by mouth 2 (two) times daily.     . metoprolol tartrate (LOPRESSOR) 25 MG tablet TAKE 1 TABLET TWICE DAILY 180 tablet 3  . Multiple Vitamin (MULTIVITAMIN PO) Take 1 tablet by mouth daily.     . niacin (NIASPAN) 1000 MG CR tablet Take 1,000 mg by mouth daily.      . Omega-3 Fatty Acids (FISH OIL) 1200 MG CAPS Take 1,000 mg by mouth daily.     [name]. omeprazole (PRILOSEC) 20 MG capsule Take 20 mg by mouth daily.       No current facility-administered medications for this visit.    Allergies:   Simvastatin    Social History:  The patient  reports that he has never smoked. He does not have any smokeless tobacco history on file. He reports that he does not drink alcohol.   Family History:  The patient's family history includes Heart attack in his father.    ROS:  Please see the history of present illness.    Review of Systems: Constitutional:  denies fever, chills, diaphoresis, appetite change and fatigue.  HEENT: denies photophobia, eye pain, redness, hearing loss, ear pain, congestion, sore throat, rhinorrhea, sneezing, neck pain, neck stiffness and tinnitus.  Respiratory: denies SOB, DOE, cough, chest tightness, and wheezing.  Cardiovascular:  denies chest pain, palpitations and leg swelling.  Gastrointestinal: denies nausea, vomiting, abdominal pain, diarrhea, constipation, blood in stool.  Genitourinary: denies dysuria, urgency, frequency, hematuria, flank pain and difficulty urinating.  Musculoskeletal: denies  myalgias, back pain, joint swelling, arthralgias and gait problem.   Skin: denies pallor, rash and wound.  Neurological: denies dizziness, seizures, syncope, weakness, light-headedness, numbness and headaches.   Hematological: denies adenopathy, easy bruising, personal or family bleeding history.  Psychiatric/ Behavioral: denies suicidal ideation, mood changes, confusion, nervousness, sleep disturbance and agitation.       All other systems are reviewed and negative.    PHYSICAL EXAM: VS:  BP 112/84 mmHg   Pulse 72  Ht  (1.753 m)  Wt 202 lb 12.8 oz (91.989 kg)  BMI 29.93 kg/m2 , BMI Body mass index is 29.93 kg/(m^2). GEN: Well nourished, well developed, in no acute distress HEENT: normal Neck: no JVD, carotid bruits, or masses Cardiac: RRR; no murmurs, rubs, or gallops,no edema  Respiratory:  clear to auscultation bilaterally, normal work of breathing GI: soft, nontender, nondistended, + BS MS: no deformity or atrophy Skin: warm and dry, no rash Neuro:  Strength and sensation are intact Psych: normal   EKG:  EKG is ordered today. The ekg ordered today demonstrates NSR at 72. Early repol.     Recent Labs: 01/21/2015: ALT 30; BUN 21; Creatinine, Ser 0.83; Potassium 4.3; Sodium 137    Lipid Panel    Component Value Date/Time   CHOL 120 01/21/2015 0922   TRIG 73.0 01/21/2015 0922   HDL 37.70* 01/21/2015 0922   CHOLHDL 3 01/21/2015 0922   VLDL 14.6 01/21/2015 0922   LDLCALC 68 01/21/2015 0922   LDLDIRECT 152.0 03/19/2011 0831      Wt Readings from Last 3 Encounters:  01/21/16 202 lb 12.8 oz (91.989 kg)  01/21/15 204 lb 12.8 oz (92.897 kg)  01/16/14 202 lb 1.9 oz (91.681 kg)      Other studies Reviewed: Additional studies/ records that were reviewed today include: . Review of the above records demonstrates:    ASSESSMENT AND PLAN:  1. Coronary artery disease-status post PTCA and stenting of his left circumflex artery and right coronary artery- he's doing very well. Is not having any episodes of angina. We'll continue to treat his lipids aggressively. We'll check fasting lipids today.  2. Hyperlipidemia - check fasting lipids today.  3. Leg pain. He occasionally has some leg burning when he is walking up until. He typically does not have any leg pain when he's exercising at the [facility]. We discussed the possibility that he may have some peripheral vascular disease. He will give [place] a call if the pains worsen.   Current medicines are reviewed at length with the  patient today.  The patient does not have concerns regarding medicines.  The following changes have been made:  no change  Labs/ tests ordered today include:  No orders of the defined types were placed in this encounter.     Disposition:   FU with me in 1 year      Signed, Anastasha Ortez, Deloris Ping, MD  01/21/2016 8:40 AM    [facility] [address] [place], [place], [place]  [address] Phone: [phone]; Fax: [phone]

## 2016-06-29 ENCOUNTER — Other Ambulatory Visit: Payer: Self-pay | Admitting: Cardiovascular Disease

## 2016-07-28 ENCOUNTER — Encounter: Payer: Self-pay | Admitting: Family Medicine

## 2016-07-28 ENCOUNTER — Ambulatory Visit (INDEPENDENT_AMBULATORY_CARE_PROVIDER_SITE_OTHER): Payer: Medicare HMO | Admitting: Family Medicine

## 2016-07-28 VITALS — BP 138/90 | HR 73 | Temp 98.0°F | Ht 69.0 in | Wt 201.0 lb

## 2016-07-28 DIAGNOSIS — E049 Nontoxic goiter, unspecified: Secondary | ICD-10-CM

## 2016-07-28 DIAGNOSIS — R42 Dizziness and giddiness: Secondary | ICD-10-CM | POA: Diagnosis not present

## 2016-07-28 DIAGNOSIS — G4700 Insomnia, unspecified: Secondary | ICD-10-CM

## 2016-07-28 DIAGNOSIS — K219 Gastro-esophageal reflux disease without esophagitis: Secondary | ICD-10-CM | POA: Insufficient documentation

## 2016-07-28 LAB — COMPREHENSIVE METABOLIC PANEL
ALT: 24 U/L (ref 0–53)
AST: 19 U/L (ref 0–37)
Albumin: 4.5 g/dL (ref 3.5–5.2)
Alkaline Phosphatase: 61 U/L (ref 39–117)
BUN: 17 mg/dL (ref 6–23)
CO2: 29 mEq/L (ref 19–32)
Calcium: 10 mg/dL (ref 8.4–10.5)
Chloride: 101 mEq/L (ref 96–112)
Creatinine, Ser: 0.81 mg/dL (ref 0.40–1.50)
GFR: 99.25 mL/min (ref >60.00)
Glucose, Bld: 79 mg/dL (ref 70–99)
Potassium: 4.6 mEq/L (ref 3.5–5.1)
Sodium: 137 mEq/L (ref 135–145)
Total Bilirubin: 0.5 mg/dL (ref 0.2–1.2)
Total Protein: 7.8 g/dL (ref 6.0–8.3)

## 2016-07-28 LAB — CBC
HCT: 47 % (ref 39.0–52.0)
Hemoglobin: 15.9 g/dL (ref 13.0–17.0)
MCHC: 33.8 g/dL (ref 30.0–36.0)
MCV: 89.1 fl (ref 78.0–100.0)
Platelets: 236 10*3/uL (ref 150.0–400.0)
RBC: 5.28 Mil/uL (ref 4.22–5.81)
RDW: 14 % (ref 11.5–15.5)
WBC: 6.9 10*3/uL (ref 4.0–10.5)

## 2016-07-28 LAB — TSH: TSH: 0.72 u[IU]/mL (ref 0.35–4.50)

## 2016-07-28 LAB — T4, FREE: Free T4: 0.76 ng/dL (ref 0.60–1.60)

## 2016-07-28 NOTE — Progress Notes (Signed)
Pre visit review using our clinic review tool, if applicable. No additional management support is needed unless otherwise documented below in the visit note. 

## 2016-07-28 NOTE — Patient Instructions (Signed)
Labs before you go - want to check thyroid levels and make sure nothing is going on dizziness wise  I want you to push fluids to at least 50-60 oz a day. Hold your flomax for now as this can cause dizziness with standing. If you have recurrent symptoms or new or worsening symptoms see me back- otherwise let's check in 2-3 weeks from now to reassess- may start back flomax if no further lightheadedness with increasing fluids and holding flomax

## 2016-07-28 NOTE — Progress Notes (Addendum)
Phone: [phone]  Subjective:  Patient presents today to establish care. Chief complaint-noted.   See problem oriented charting  The following were reviewed and entered/updated in epic: Past Medical History:  Diagnosis Date  . Coronary artery disease    status post PTCA and stenting of his left circumflex artery and right coronary artery  . Hyperlipidemia   . Reflux    omeprazole 40mg    Patient Active Problem List   Diagnosis Date Noted  . CAD (coronary artery disease) 06/21/2011    Priority: High  . Hyperlipidemia 06/21/2011    Priority: Medium  . GERD (gastroesophageal reflux disease)    Past Surgical History:  Procedure Laterality Date  . Benign Cyst     on back  . CARDIOVASCULAR STRESS TEST  05-09-2007   EF 64%  . carpal tunnel left    . CYSTECTOMY     removed from finger  . right knee surgery     arthroscopyc 02/2010    Family History  Problem Relation Age of Onset  . Bladder Cancer Mother     passed age 66.   [name] Heart attack Father     25 passed from this  . Alcohol abuse Father     Medications- reviewed and updated Current Outpatient Prescriptions  Medication Sig Dispense Refill  . aspirin 81 MG tablet Take 1 tablet (81 mg total) by mouth daily.    [name] atorvastatin (LIPITOR) 40 MG tablet Take 1 tablet (40 mg total) by mouth daily. 90 tablet 1  . Calcium Carbonate-Vitamin D (CALCIUM + D PO) Take 1 tablet by mouth daily.     [name] glucosamine-chondroitin 500-400 MG tablet Take 1 tablet by mouth 2 (two) times daily.    . Melatonin 10 MG TABS Take by mouth.    . metoprolol tartrate (LOPRESSOR) 25 MG tablet Take 1 tablet (25 mg total) by mouth 2 (two) times daily. 180 tablet 1  . Multiple Vitamin (MULTIVITAMIN PO) Take 1 tablet by mouth daily.     . Omega-3 Fatty Acids (FISH OIL) 1200 MG CAPS Take 1,000 mg by mouth daily.     [name] omeprazole (PRILOSEC) 40 MG capsule Take 40 mg by mouth daily.     . tamsulosin (FLOMAX) 0.4 MG CAPS capsule Take 0.4 mg by mouth.    [name]  PARoxetine (PAXIL) 20 MG tablet Take 20 mg by mouth daily.     No current facility-administered medications for this visit.     Allergies-reviewed and updated Allergies  Allergen Reactions  . Simvastatin Other (See Comments)    Leg pain     Social History   Social History  . Marital status: Married    Spouse name: N/A  . Number of children: N/A  . Years of education: N/A   Social History Main Topics  . Smoking status: Never Smoker  . Smokeless tobacco: Never Used  . Alcohol use No  . Drug use: No  . Sexual activity: Not Asked   Other Topics Concern  . None   Social History Narrative   Married. Wife will be patient of Dr. [place]. 3 grown children, 3 grown stepkids. 9 grandkids.       Retired from [employer]. Over 46 years.     ROS--Full ROS was completed Review of Systems  Constitutional: Negative for chills and fever.  HENT: Negative for hearing loss.   Eyes: Negative for blurred vision and double vision.  Respiratory: Negative for cough, hemoptysis and shortness of breath.   Cardiovascular: Negative for  chest pain and palpitations.  Gastrointestinal: Negative for heartburn, nausea and vomiting.  Genitourinary: Negative for dysuria and urgency.  Musculoskeletal: Negative for falls and myalgias.  Skin: Negative for itching and rash.  Neurological: Positive for dizziness (). Negative for tingling, tremors, sensory change, speech change, focal weakness, seizures, loss of consciousness and headaches.  Endo/Heme/Allergies: Negative for polydipsia. Does not bruise/bleed easily.  Psychiatric/Behavioral: Negative for substance abuse and suicidal ideas.   Objective: BP 138/90 (BP Location: Left Arm, Patient Position: Sitting, Cuff Size: Normal)   Pulse 73   Temp 98 F (36.7 C) (Oral)   Ht 5\' 9"  (1.753 m)   Wt 201 lb (91.2 kg)   SpO2 98%   BMI 29.68 kg/m  Gen: NAD, resting comfortably, lightheaded with standing HEENT: Mucous membranes are moist. Oropharynx  normal. TM normal. Eyes: sclera and lids normal, PERRLA Neck: right sided thyroid enlargement, no cervical lymphadenopathy CV: RRR no murmurs rubs or gallops Lungs: CTAB no crackles, wheeze, rhonchi Abdomen: soft/nontender/nondistended/normal bowel sounds. No rebound or guarding.  Ext: no edema Skin: warm, dry Neuro: CN II-XII intact (smile on right not as high but states this is his baseline when showed picture of himself), sensation and reflexes normal throughout, 5/5 muscle strength in bilateral upper and lower extremities. Normal finger to nose. Normal rapid alternating movements. No pronator drift. Normal romberg. Normal gait.    Assessment/Plan:   Dizzy Anxiety/insomnia Right thyroid enlargement S: Patient has had 2 episodes in last 2 weeks. First episode about 10 minutes and second episode today and still lingering at this time. Went to nurse at the [facility] and after he did a few exercises BP was 97/71 on machine then nurse checked and slightly higher.   1st episode- bowls 3x a week. 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 No heart racing or chest pain. No shortness of breath. Improved with sitting down  2nd episode was today before starting to exercise- did some light exercises and then rested and resolved after sitting. His story with me differs from report to nurse which was that he had finished his exercises.   Started flomax 4 months ago through [facility] urology due to nocturia. States also started paxil through for anxiety through urology- stopped 2-3 days ago and has stopped intermittently because mind was wandering at night with worries of the day- issues have not been better on the paxil so he stops at times.   On exam- noted some right thyroid enlargement- states prior PCP thought this was just muscle enlargement A/P: Dizziness- reassuring neuro exam (doubt CVA or TIA). Reassuring cardiac exam (doubt  bradycardia related though possible and doubt sign of ischemia)- though did encourage cardiology follow up at next available instead of waiting until next year). Orthostatic hypotension most likely- increase fluids and stop flomax- reassess 2-3 weeks (seek care if worsening before then). Also check CBC, CMP, TSH  Right thyroid gland enlarged - check TSH and free T4, consider ultrasound at follow up  Anxiety/insomnia- now off paxil 3 days- advised to remain off as was not helping with his insomnia issues despite several months trial. Denies SI. Offered behavioral health- declines for now  BP mildly elevated- no history of HTN- monitor at follow up  2-3 weeks  Orders Placed This Encounter  Procedures  . CBC    [place]  . Comprehensive metabolic panel    [place]  . TSH    [place]  . T4, free  [place]    Meds ordered this encounter  Medications  . PARoxetine (PAXIL) 20 MG tablet    Sig: Take 20 mg by mouth daily.  . tamsulosin (FLOMAX) 0.4 MG CAPS capsule    Sig: Take 0.4 mg by mouth.  . Melatonin 10 MG TABS    Sig: Take by mouth.    Return precautions advised.  Tana ConchStephen Caitlin Ainley, MD

## 2016-07-28 NOTE — Progress Notes (Signed)
Pt [facility] and felt dizzy.  States it began when he got here around 7:30am, did most of his workout but ended early to check his BP with the BP machine in the lobby.  His BP with the machine was 97/71.  He came in to my office to have a manual BP checked and it was 110/78, HR 75 regular, CBG 111.  He states he had a dizzy spell 2-Thursdays ago as well.  Denies chest pain, nausea, or other symptoms but states he has not been taking his anxiety medicine the last 3-4 nights b/c he doesn't think they help, but hasn't been sleeping well, which is a chronic problem for him.  Denies any changes to his normal routine today or the last time he was dizzy.  He has been taking Flo-max since the middle of July.  States he's in the middle of changing PMD's because his PMD is retiring.  He states the last time he had this dizzy spell, it resolved after 10 minutes of sitting, but this time it is coming and going but lasting longer this time and sitting doesn't necessarily help it resolve.  He states he feels like he is fine to drive and happens to be going to [facility] to pick up his wife's insurance card there.  I've recommended that he mention his symptoms and his visit with me when he gets there.

## 2016-08-14 ENCOUNTER — Encounter: Payer: Self-pay | Admitting: Family Medicine

## 2016-08-14 DIAGNOSIS — E559 Vitamin D deficiency, unspecified: Secondary | ICD-10-CM | POA: Insufficient documentation

## 2016-08-14 DIAGNOSIS — I10 Essential (primary) hypertension: Secondary | ICD-10-CM | POA: Insufficient documentation

## 2016-08-14 DIAGNOSIS — Z86010 Personal history of colonic polyps: Secondary | ICD-10-CM | POA: Insufficient documentation

## 2016-08-18 ENCOUNTER — Ambulatory Visit: Payer: Medicare HMO | Admitting: Family Medicine

## 2016-09-06 ENCOUNTER — Encounter: Payer: Self-pay | Admitting: Family Medicine

## 2016-09-06 ENCOUNTER — Ambulatory Visit (INDEPENDENT_AMBULATORY_CARE_PROVIDER_SITE_OTHER): Payer: Medicare HMO | Admitting: Family Medicine

## 2016-09-06 VITALS — BP 132/76 | HR 86 | Temp 97.4°F | Ht 67.0 in | Wt 207.8 lb

## 2016-09-06 DIAGNOSIS — I10 Essential (primary) hypertension: Secondary | ICD-10-CM | POA: Diagnosis not present

## 2016-09-06 DIAGNOSIS — E785 Hyperlipidemia, unspecified: Secondary | ICD-10-CM | POA: Diagnosis not present

## 2016-09-06 DIAGNOSIS — N401 Enlarged prostate with lower urinary tract symptoms: Secondary | ICD-10-CM | POA: Diagnosis not present

## 2016-09-06 DIAGNOSIS — N400 Enlarged prostate without lower urinary tract symptoms: Secondary | ICD-10-CM | POA: Insufficient documentation

## 2016-09-06 DIAGNOSIS — F411 Generalized anxiety disorder: Secondary | ICD-10-CM | POA: Insufficient documentation

## 2016-09-06 NOTE — Progress Notes (Signed)
Subjective:  [name] [name] is a 75 y.o. year old very pleasant male patient who presents for/with See problem oriented charting ROS- trace unchanged edema. No chest pain or shortness of breath. No headache or blurry vision.   Past Medical History-  Patient Active Problem List   Diagnosis Date Noted  . CAD (coronary artery disease) 06/21/2011    Priority: High  . BPH (benign prostatic hyperplasia) 09/06/2016    Priority: Medium  . Essential hypertension 08/14/2016    Priority: Medium  . Hyperlipidemia 06/21/2011    Priority: Medium  . Anxiety state 09/06/2016    Priority: Low  . History of adenomatous polyp of colon 08/14/2016    Priority: Low  . Vitamin D deficiency 08/14/2016    Priority: Low  . GERD (gastroesophageal reflux disease)     Priority: Low    Medications- reviewed and updated Current Outpatient Prescriptions  Medication Sig Dispense Refill  . aspirin 81 MG tablet Take 1 tablet (81 mg total) by mouth daily.    [name]. atorvastatin (LIPITOR) 40 MG tablet Take 1 tablet (40 mg total) by mouth daily. 90 tablet 1  . Calcium Carbonate-Vitamin D (CALCIUM + D PO) Take 1 tablet by mouth daily.     [name]. glucosamine-chondroitin 500-400 MG tablet Take 1 tablet by mouth 2 (two) times daily.    . Melatonin 10 MG TABS Take by mouth.    . metoprolol tartrate (LOPRESSOR) 25 MG tablet Take 1 tablet (25 mg total) by mouth 2 (two) times daily. 180 tablet 1  . Multiple Vitamin (MULTIVITAMIN PO) Take 1 tablet by mouth daily.     . Omega-3 Fatty Acids (FISH OIL) 1200 MG CAPS Take 1,000 mg by mouth daily.     [name]. omeprazole (PRILOSEC) 40 MG capsule Take 40 mg by mouth daily.     . tamsulosin (FLOMAX) 0.4 MG CAPS capsule Take 0.4 mg by mouth.     No current facility-administered medications for this visit.     Objective: BP 132/76 (BP Location: Left Arm, Patient Position: Sitting, Cuff Size: Large)   Pulse 86   Temp 97.4 F (36.3 C) (Oral)   Ht 5\' 7"  (1.702 m)   Wt 207 lb 12.8 oz (94.3  kg)   SpO2 97%   BMI 32.55 kg/m  Gen: NAD, resting comfortably CV: RRR no murmurs rubs or gallops Lungs: CTAB no crackles, wheeze, rhonchi Abdomen: soft/nontender/nondistended/normal bowel sounds. No rebound or guarding.  Ext: trace edema Skin: warm, dry  Assessment/Plan:  Essential hypertension S: controlled on metoprolol 25mg  BID. He declines diagnosis but was listed at outside office. In addition- per new guidelines mild poor control BP Readings from Last 3 Encounters:  09/06/16 132/76  07/28/16 138/90  07/28/16 110/78  A/P:Continue current meds:  Ok with not adjusting BP as long as <140/90 but push for lifestyle changes   Hyperlipidemia S: well controlled on atorvastatin 40mg  with LDL<70- no longer on niacin. No myalgias.  Lab Results  Component Value Date   CHOL 100 (L) 01/21/2016   HDL 35 (L) 01/21/2016   LDLCALC 55 01/21/2016   LDLDIRECT 152.0 03/19/2011   TRIG 52 01/21/2016   CHOLHDL 2.9 01/21/2016   A/P: continue current medicine   BPH (benign prostatic hyperplasia) S: well controlled on flomax. Had stopped med for a few weeks due to dizziness/orthostatic symptoms. That has since resolved. He restarted medicine as we had discussed and still no orthostatic symptoms.  A/P: continue flomax at this point as orthostatics have resolved- continue  to stay hydrated   1 year CPE. AWV 05/20/16 so do next year.   Return precautions advised.  Tana ConchStephen Woodroe Vogan, MD

## 2016-09-06 NOTE — Assessment & Plan Note (Signed)
S: controlled on metoprolol 25mg  BID. He declines diagnosis but was listed at outside office. In addition- per new guidelines mild poor control BP Readings from Last 3 Encounters:  09/06/16 132/76  07/28/16 138/90  07/28/16 110/78  A/P:Continue current meds:  Ok with not adjusting BP as long as <140/90 but push for lifestyle changes

## 2016-09-06 NOTE — Assessment & Plan Note (Signed)
S: well controlled on flomax. Had stopped med for a few weeks due to dizziness/orthostatic symptoms. That has since resolved. He restarted medicine as we had discussed and still no orthostatic symptoms.  A/P: continue flomax at this point as orthostatics have resolved- continue to stay hydrated

## 2016-09-06 NOTE — Patient Instructions (Addendum)
We will try to get records for your immunizations  Glad dizziness is better  Glad anxiety is better  Actinic keratosis on ear- frozen today. Will likely blister up- can use vaseline or neosporin and a bandaid. Follow up if expanding redness past a day or two, worsening pain after that point, fever.

## 2016-09-06 NOTE — Assessment & Plan Note (Signed)
S: well controlled on atorvastatin 40mg  with LDL<70- no longer on niacin. No myalgias.  Lab Results  Component Value Date   CHOL 100 (L) 01/21/2016   HDL 35 (L) 01/21/2016   LDLCALC 55 01/21/2016   LDLDIRECT 152.0 03/19/2011   TRIG 52 01/21/2016   CHOLHDL 2.9 01/21/2016   A/P: continue current medicine

## 2016-09-06 NOTE — Progress Notes (Signed)
Pre visit review using our clinic review tool, if applicable. No additional management support is needed unless otherwise documented below in the visit note. 

## 2016-09-16 LAB — HM COLONOSCOPY

## 2017-01-21 ENCOUNTER — Ambulatory Visit: Payer: Medicare HMO | Admitting: Family Medicine

## 2017-01-22 ENCOUNTER — Other Ambulatory Visit: Payer: Self-pay | Admitting: Cardiovascular Disease

## 2017-01-27 ENCOUNTER — Encounter: Payer: Self-pay | Admitting: Cardiovascular Disease

## 2017-01-27 ENCOUNTER — Ambulatory Visit (INDEPENDENT_AMBULATORY_CARE_PROVIDER_SITE_OTHER): Payer: Medicare HMO | Admitting: Cardiovascular Disease

## 2017-01-27 VITALS — BP 106/70 | HR 80 | Ht 67.0 in | Wt 206.6 lb

## 2017-01-27 DIAGNOSIS — I2510 Atherosclerotic heart disease of native coronary artery without angina pectoris: Secondary | ICD-10-CM | POA: Diagnosis not present

## 2017-01-27 DIAGNOSIS — E782 Mixed hyperlipidemia: Secondary | ICD-10-CM

## 2017-01-27 NOTE — Progress Notes (Signed)
Cardiology Office Note   Date:  01/27/2017   ID:  [name] [name], [name] [name] 1941-08-18, MRN [id number]  PCP:  [name], MD  Cardiologist:   [name], MD   Chief Complaint  Patient presents with  . Coronary Artery Disease   1. Coronary artery disease-status post PTCA and stenting of his left circumflex artery and right coronary artery 2. Hyperlipidemia  History of Present Illness:  76 year old gentleman with a history of coronary artery disease. He status post PTCA and stenting of the left circumflex artery as well as PTCA of the posterior descending artery and January 2002. He's done very well. He has not had any episodes of chest pain or shortness of breath.  January 16, 2014:  Awesome feels well - no angina, no dyspnea. He is watching his diet   January 21, 2015:  [name] [name] is a 76 y.o. male who presents for follow-up of his coronary artery disease. Still bowling  [facility]  Does some cardio.   January 21, 2016:  [name] [name] is seen today for his CAD.    Has some muscle stiffness.  No muscle aches.  Has done some stretches.   January 27, 2017:  Having some muscle pull issus Having some ortho issues.  No CP  Is now changing Dr. [name] at [place].  Has stopped his Niacin .   Still goes to the [facility].  Does light cardio and some machines.     Past Medical History:  Diagnosis Date  . Coronary artery disease    status post PTCA and stenting of his left circumflex artery and right coronary artery  . Hyperlipidemia   . Reflux    omeprazole     Past Surgical History:  Procedure Laterality Date  . Benign Cyst     on back  . CARDIOVASCULAR STRESS TEST  05-09-2007   EF 64%  . carpal tunnel left    . CYSTECTOMY     removed from finger  . right knee surgery     arthroscopyc 02/2010     Current Outpatient Prescriptions  Medication Sig Dispense Refill  . aspirin 81 MG tablet Take 1 tablet (81 mg total) by mouth daily.     [name] atorvastatin (LIPITOR) 40 MG tablet TAKE 1 TABLET EVERY DAY 30 tablet 0  . Calcium Carbonate-Vitamin D (CALCIUM + D PO) Take 1 tablet by mouth daily.     [name] glucosamine-chondroitin 500-400 MG tablet Take 1 tablet by mouth 2 (two) times daily.    . Melatonin 10 MG TABS Take by mouth.    . metoprolol tartrate (LOPRESSOR) 25 MG tablet TAKE 1 TABLET TWICE DAILY 60 tablet 0  . Multiple Vitamin (MULTIVITAMIN PO) Take 1 tablet by mouth daily.     . Omega-3 Fatty Acids (FISH OIL) 1200 MG CAPS Take 1,000 mg by mouth daily.     [name] omeprazole (PRILOSEC) 40 MG capsule Take 40 mg by mouth daily.     . tamsulosin (FLOMAX) 0.4 MG CAPS capsule Take 0.4 mg by mouth.     No current facility-administered medications for this visit.     Allergies:   Simvastatin    Social History:  The patient  reports that he has never smoked. He has never used smokeless tobacco. He reports that he does not drink alcohol or use drugs.   Family History:  The patient's family history includes Alcohol abuse in his father; Bladder Cancer in his mother; Heart  attack in his father.    ROS:  Please see the history of present illness.    Review of Systems: Constitutional:  denies fever, chills, diaphoresis, appetite change and fatigue.  HEENT: denies photophobia, eye pain, redness, hearing loss, ear pain, congestion, sore throat, rhinorrhea, sneezing, neck pain, neck stiffness and tinnitus.  Respiratory: denies SOB, DOE, cough, chest tightness, and wheezing.  Cardiovascular: denies chest pain, palpitations and leg swelling.  Gastrointestinal: denies nausea, vomiting, abdominal pain, diarrhea, constipation, blood in stool.  Genitourinary: denies dysuria, urgency, frequency, hematuria, flank pain and difficulty urinating.  Musculoskeletal: denies  myalgias, back pain, joint swelling, arthralgias and gait problem.   Skin: denies pallor, rash and wound.  Neurological: denies dizziness, seizures, syncope, weakness,  light-headedness, numbness and headaches.   Hematological: denies adenopathy, easy bruising, personal or family bleeding history.  Psychiatric/ Behavioral: denies suicidal ideation, mood changes, confusion, nervousness, sleep disturbance and agitation.       All other systems are reviewed and negative.    PHYSICAL EXAM: VS:  BP 106/70 (BP Location: Left Arm, Patient Position: Sitting, Cuff Size: Normal)   Pulse 80   Ht  (1.702 m)   Wt 206 lb 9.6 oz (93.7 kg)   SpO2 98%   BMI 32.36 kg/m  , BMI Body mass index is 32.36 kg/m. GEN: Well nourished, well developed, in no acute distress  HEENT: normal  Neck: no JVD, carotid bruits, or masses Cardiac: RRR; no murmurs, rubs, or gallops,no edema  Respiratory:  clear to auscultation bilaterally, normal work of breathing GI: soft, nontender, nondistended, + BS MS: no deformity or atrophy  Skin: warm and dry, no rash Neuro:  Strength and sensation are intact Psych: normal   EKG:  EKG is ordered today. The ekg ordered today demonstrates NSR at 80.   Small inf. Lat Q waves. Early repol.     Recent Labs: 07/28/2016: ALT 24; BUN 17; Creatinine, Ser 0.81; Hemoglobin 15.9; Platelets 236.0; Potassium 4.6; Sodium 137; TSH 0.72    Lipid Panel    Component Value Date/Time   CHOL 100 (L) 01/21/2016 0914   TRIG 52 01/21/2016 0914   HDL 35 (L) 01/21/2016 0914   CHOLHDL 2.9 01/21/2016 0914   VLDL 10 01/21/2016 0914   LDLCALC 55 01/21/2016 0914   LDLDIRECT 152.0 03/19/2011 0831      Wt Readings from Last 3 Encounters:  01/27/17 206 lb 9.6 oz (93.7 kg)  09/06/16 207 lb 12.8 oz (94.3 kg)  07/28/16 201 lb (91.2 kg)      Other studies Reviewed: Additional studies/ records that were reviewed today include: . Review of the above records demonstrates:    ASSESSMENT AND PLAN:  1. Coronary artery disease-status post PTCA and stenting of his left circumflex artery and right coronary artery- he's doing very well. Is not having any  episodes of angina. We'll continue to treat his lipids aggressively. We'll check fasting lipids today.  2. Hyperlipidemia - check fasting lipids today.  3. Leg pain. He occasionally has some leg burning when he is walking up until. He typically does not have any leg pain when he's exercising at the [facility]. We discussed the possibility that he may have some peripheral vascular disease. He will give [place] a call if the pains worsen.   Current medicines are reviewed at length with the patient today.  The patient does not have concerns regarding medicines.  The following changes have been made:  no change  Labs/ tests ordered today include:  No orders  of the defined types were placed in this encounter.    Disposition:   FU with me in 1 year      Signed, [name], MD  01/27/2017 2:38 PM    [facility] [address] [place], [place], [place]  [address] Phone: [phone]; Fax: [phone]

## 2017-01-27 NOTE — Patient Instructions (Signed)
Medication Instructions:  Your physician recommends that you continue on your current medications as directed. Please refer to the Current Medication list given to you today.   Labwork: TODAY - cholesterol, complete metabolic panel   Testing/Procedures: None Ordered   Follow-Up: Your physician wants you to follow-up in: 1 year with Dr. Nahser.  You will receive a reminder letter in the mail two months in advance. If you don't receive a letter, please call our office to schedule the follow-up appointment.   If you need a refill on your cardiac medications before your next appointment, please call your pharmacy.   Thank you for choosing [facility]! Rami Budhu, RN [phone]    

## 2017-01-28 LAB — COMPREHENSIVE METABOLIC PANEL
ALT: 29 IU/L (ref 0–44)
AST: 24 IU/L (ref 0–40)
Albumin/Globulin Ratio: 1.7 (ref 1.2–2.2)
Albumin: 4.4 g/dL (ref 3.5–4.8)
Alkaline Phosphatase: 68 IU/L (ref 39–117)
BUN/Creatinine Ratio: 17 (ref 10–24)
BUN: 14 mg/dL (ref 8–27)
Bilirubin Total: 0.6 mg/dL (ref 0.0–1.2)
CO2: 26 mmol/L (ref 18–29)
Calcium: 9.4 mg/dL (ref 8.6–10.2)
Chloride: 98 mmol/L (ref 96–106)
Creatinine, Ser: 0.83 mg/dL (ref 0.76–1.27)
GFR calc Af Amer: 101 mL/min/{1.73_m2} (ref >59)
GFR calc non Af Amer: 87 mL/min/{1.73_m2} (ref >59)
Globulin, Total: 2.6 g/dL (ref 1.5–4.5)
Glucose: 76 mg/dL (ref 65–99)
Potassium: 4.4 mmol/L (ref 3.5–5.2)
Sodium: 139 mmol/L (ref 134–144)
Total Protein: 7 g/dL (ref 6.0–8.5)

## 2017-01-28 LAB — LIPID PANEL
Chol/HDL Ratio: 3.6 ratio (ref 0.0–5.0)
Cholesterol, Total: 134 mg/dL (ref 100–199)
HDL: 37 mg/dL — ABNORMAL LOW (ref >39)
LDL Calculated: 76 mg/dL (ref 0–99)
Triglycerides: 105 mg/dL (ref 0–149)
VLDL Cholesterol Cal: 21 mg/dL (ref 5–40)

## 2017-01-31 ENCOUNTER — Other Ambulatory Visit: Payer: Self-pay | Admitting: *Deleted

## 2017-01-31 MED ORDER — ATORVASTATIN CALCIUM 40 MG PO TABS: 40.0000 mg | ORAL_TABLET | Freq: Every day | ORAL | 3 refills | Status: DC

## 2017-01-31 MED ORDER — METOPROLOL TARTRATE 25 MG PO TABS: 25.0000 mg | ORAL_TABLET | Freq: Two times a day (BID) | ORAL | 3 refills | Status: DC

## 2017-06-20 ENCOUNTER — Encounter: Payer: Self-pay | Admitting: Family Medicine

## 2017-06-20 ENCOUNTER — Ambulatory Visit (INDEPENDENT_AMBULATORY_CARE_PROVIDER_SITE_OTHER): Payer: Medicare HMO | Admitting: Family Medicine

## 2017-06-20 VITALS — BP 110/72 | HR 89 | Temp 98.3°F | Ht 67.0 in | Wt 201.4 lb

## 2017-06-20 DIAGNOSIS — M5442 Lumbago with sciatica, left side: Secondary | ICD-10-CM | POA: Diagnosis not present

## 2017-06-20 MED ORDER — PREDNISONE 20 MG PO TABS: ORAL_TABLET | ORAL | 0 refills | Status: DC

## 2017-06-20 NOTE — Patient Instructions (Addendum)
Recheck BP  This appears to be pinched nerve given positive straight leg raise and low back pain radiating into left leg. Advised 7 day course of prednisone. If not at least 50% better in 10-14 days- would advise him to follow up with orthopedics- Dr. Ethelene Hal- we can place a referral if needed

## 2017-06-20 NOTE — Progress Notes (Signed)
Subjective:  [name] [name] is a 76 y.o. year old very pleasant male patient who presents for/with See problem oriented charting ROS- low back pain radiating into left leg. No leg weakness. No edema. No chest pain.    Past Medical History-  Patient Active Problem List   Diagnosis Date Noted  . CAD (coronary artery disease) 06/21/2011    Priority: High  . BPH (benign prostatic hyperplasia) 09/06/2016    Priority: Medium  . Essential hypertension 08/14/2016    Priority: Medium  . Hyperlipidemia 06/21/2011    Priority: Medium  . Anxiety state 09/06/2016    Priority: Low  . History of adenomatous polyp of colon 08/14/2016    Priority: Low  . Vitamin D deficiency 08/14/2016    Priority: Low  . GERD (gastroesophageal reflux disease)     Priority: Low    Medications- reviewed and updated Current Outpatient Prescriptions  Medication Sig Dispense Refill  . aspirin 81 MG tablet Take 1 tablet (81 mg total) by mouth daily.    [name] atorvastatin (LIPITOR) 40 MG tablet Take 1 tablet (40 mg total) by mouth daily. 90 tablet 3  . Calcium Carbonate-Vitamin D (CALCIUM + D PO) Take 1 tablet by mouth daily.     [name] glucosamine-chondroitin 500-400 MG tablet Take 1 tablet by mouth 2 (two) times daily.    . Melatonin 10 MG TABS Take by mouth.    . metoprolol tartrate (LOPRESSOR) 25 MG tablet Take 1 tablet (25 mg total) by mouth 2 (two) times daily. 180 tablet 3  . Multiple Vitamin (MULTIVITAMIN PO) Take 1 tablet by mouth daily.     . Omega-3 Fatty Acids (FISH OIL) 1200 MG CAPS Take 1,000 mg by mouth daily.     [name] omeprazole (PRILOSEC) 40 MG capsule Take 40 mg by mouth daily.     . tamsulosin (FLOMAX) 0.4 MG CAPS capsule Take 0.4 mg by mouth.    . predniSONE (DELTASONE) 20 MG tablet Take 2 pills for 3 days, 1 pill for 4 days 10 tablet 0   No current facility-administered medications for this visit.     Objective: BP 110/72   Pulse 89   Temp 98.3 F (36.8 C) (Oral)   Ht  (1.702 m)   Wt 201  lb 6.4 oz (91.4 kg)   SpO2 97%   BMI 31.54 kg/m  Gen: NAD, resting comfortably CV: RRR no murmurs rubs or gallops Lungs: CTAB no crackles, wheeze, rhonchi Ext: no edema Skin: warm, dry  Back - Normal skin, Spine with normal alignment and no deformity.  No tenderness to vertebral process palpation.  Paraspinous muscles are not tender and without spasm.   Range of motion is full at neck. Positive Straight leg raise on right. Normal hip movement bilaterally without pain Neuro- no saddle anesthesia, 5/5 strength lower extremities, 2+ reflexes. Antalgic gait  Assessment/Plan:  Acute left-sided low back pain with left-sided sciatica S:  stiff back for a few years. 2 months ago started suddenly with severe back pain without fall or injury occurring. Has had off and on severe pain for 2 months. Also pain in the leg on and off over the last 2 months- has periods where it is worse. Hurts into the back of his leg and then lateral lower leg and then down into foot/heel. Back seems to bother him worse than the leg but both bother him.   Has tried muscle cream and ibuprofen- helps a little bit.   Within 3 years- pulled  hamstring 3 different times. Last April fell while bowling and took 3 months off. Had hip, hamstring MRI. Never had back MRI due to clip in his colon. Dr. Ethelene Hal and Dr. Charlann Boxer- [facility]- were working him up for above issues. Had been doing well until these last 2 months.   Numbness left foot for over 25 years- saw neurosurgeon and told had "killed nerve" without unclear cause- still has some numbness and pain there. No recent neurosurgery visit.  A/P: This appears to be pinched nerve given positive straight leg raise and low back pain radiating into left leg. Advised 7 day course of prednisone. If not at least 50% better in 10-14 days- would advise him to follow up with orthopedics- Dr. Ethelene Hal- we can place a referral if needed Future Appointments Date Time Provider Department Center   09/07/2017 8:45 AM Shelva Majestic, MD [facility] None   Meds ordered this encounter  Medications  . predniSONE (DELTASONE) 20 MG tablet    Sig: Take 2 pills for 3 days, 1 pill for 4 days    Dispense:  10 tablet    Refill:  0   Return precautions advised.  [name] Conch, MD

## 2017-09-07 ENCOUNTER — Ambulatory Visit (INDEPENDENT_AMBULATORY_CARE_PROVIDER_SITE_OTHER): Payer: Medicare HMO | Admitting: Family Medicine

## 2017-09-07 ENCOUNTER — Encounter: Payer: Self-pay | Admitting: Family Medicine

## 2017-09-07 VITALS — BP 134/76 | HR 76 | Temp 98.0°F | Ht 67.5 in | Wt 193.8 lb

## 2017-09-07 DIAGNOSIS — I2510 Atherosclerotic heart disease of native coronary artery without angina pectoris: Secondary | ICD-10-CM

## 2017-09-07 DIAGNOSIS — M545 Low back pain: Secondary | ICD-10-CM | POA: Diagnosis not present

## 2017-09-07 DIAGNOSIS — K219 Gastro-esophageal reflux disease without esophagitis: Secondary | ICD-10-CM | POA: Diagnosis not present

## 2017-09-07 DIAGNOSIS — I10 Essential (primary) hypertension: Secondary | ICD-10-CM

## 2017-09-07 DIAGNOSIS — N401 Enlarged prostate with lower urinary tract symptoms: Secondary | ICD-10-CM | POA: Diagnosis not present

## 2017-09-07 DIAGNOSIS — Z0001 Encounter for general adult medical examination with abnormal findings: Secondary | ICD-10-CM

## 2017-09-07 DIAGNOSIS — E785 Hyperlipidemia, unspecified: Secondary | ICD-10-CM | POA: Diagnosis not present

## 2017-09-07 DIAGNOSIS — Z0000 Encounter for general adult medical examination without abnormal findings: Secondary | ICD-10-CM

## 2017-09-07 LAB — COMPREHENSIVE METABOLIC PANEL
ALT: 22 U/L (ref 0–53)
AST: 20 U/L (ref 0–37)
Albumin: 4.5 g/dL (ref 3.5–5.2)
Alkaline Phosphatase: 61 U/L (ref 39–117)
BUN: 18 mg/dL (ref 6–23)
CO2: 28 mEq/L (ref 19–32)
Calcium: 9.2 mg/dL (ref 8.4–10.5)
Chloride: 102 mEq/L (ref 96–112)
Creatinine, Ser: 0.82 mg/dL (ref 0.40–1.50)
GFR: 97.55 mL/min (ref >60.00)
Glucose, Bld: 95 mg/dL (ref 70–99)
Potassium: 4.7 mEq/L (ref 3.5–5.1)
Sodium: 138 mEq/L (ref 135–145)
Total Bilirubin: 0.6 mg/dL (ref 0.2–1.2)
Total Protein: 6.9 g/dL (ref 6.0–8.3)

## 2017-09-07 LAB — LDL CHOLESTEROL, DIRECT: Direct LDL: 74 mg/dL

## 2017-09-07 LAB — CBC
HCT: 46.3 % (ref 39.0–52.0)
Hemoglobin: 15.7 g/dL (ref 13.0–17.0)
MCHC: 33.8 g/dL (ref 30.0–36.0)
MCV: 90.3 fl (ref 78.0–100.0)
Platelets: 244 10*3/uL (ref 150.0–400.0)
RBC: 5.13 Mil/uL (ref 4.22–5.81)
RDW: 14.3 % (ref 11.5–15.5)
WBC: 5.2 10*3/uL (ref 4.0–10.5)

## 2017-09-07 LAB — TSH: TSH: 0.36 u[IU]/mL (ref 0.35–4.50)

## 2017-09-07 MED ORDER — OMEPRAZOLE 40 MG PO CPDR: 40.0000 mg | DELAYED_RELEASE_CAPSULE | Freq: Every day | ORAL | 3 refills | Status: DC

## 2017-09-07 MED ORDER — TAMSULOSIN HCL 0.4 MG PO CAPS: 0.4000 mg | ORAL_CAPSULE | Freq: Every day | ORAL | 3 refills | Status: DC

## 2017-09-07 NOTE — Assessment & Plan Note (Addendum)
CAD- compliant with aspirin, atorvastatin 40mg , metoprolol 25mg  BID. Seen by Dr. Elease HashimotoNahser in April . Luckily asymptomatic

## 2017-09-07 NOTE — Assessment & Plan Note (Signed)
HTN-at goal on metoprolol 25mg  BID. He denies diagnosis but was listed at outside office

## 2017-09-07 NOTE — Progress Notes (Signed)
Phone: [phone]  Subjective:  Patient presents today for their annual physical. Chief complaint-noted.   See problem oriented charting- ROS- full  review of systems was completed and negative except for: seasonal allergies and back pain  The following were reviewed and entered/updated in epic: Past Medical History:  Diagnosis Date  . Coronary artery disease    status post PTCA and stenting of his left circumflex artery and right coronary artery  . Hyperlipidemia   . Reflux    omeprazole 40mg    Patient Active Problem List   Diagnosis Date Noted  . CAD (coronary artery disease) 06/21/2011    Priority: High  . BPH (benign prostatic hyperplasia) 09/06/2016    Priority: Medium  . Essential hypertension 08/14/2016    Priority: Medium  . Hyperlipidemia 06/21/2011    Priority: Medium  . Anxiety state 09/06/2016    Priority: Low  . History of adenomatous polyp of colon 08/14/2016    Priority: Low  . Vitamin D deficiency 08/14/2016    Priority: Low  . GERD (gastroesophageal reflux disease)     Priority: Low   Past Surgical History:  Procedure Laterality Date  . Benign Cyst     on back  . CARDIOVASCULAR STRESS TEST  05-09-2007   EF 64%  . carpal tunnel left    . CYSTECTOMY     removed from finger  . right knee surgery     arthroscopyc 02/2010    Family History  Problem Relation Age of Onset  . Bladder Cancer Mother        passed age 74.   [name]. Heart attack Father        [id number] passed from this  . Alcohol abuse Father     Medications- reviewed and updated Current Outpatient Medications  Medication Sig Dispense Refill  . aspirin 81 MG tablet Take 1 tablet (81 mg total) by mouth daily.    [name]. atorvastatin (LIPITOR) 40 MG tablet Take 1 tablet (40 mg total) by mouth daily. 90 tablet 3  . Calcium Carbonate-Vitamin D (CALCIUM + D PO) Take 1 tablet by mouth daily.     [name]. glucosamine-chondroitin 500-400 MG tablet Take 1 tablet by mouth 2 (two) times daily.    . Melatonin 10 MG  TABS Take by mouth.    . metoprolol tartrate (LOPRESSOR) 25 MG tablet Take 1 tablet (25 mg total) by mouth 2 (two) times daily. 180 tablet 3  . Multiple Vitamin (MULTIVITAMIN PO) Take 1 tablet by mouth daily.     . Omega-3 Fatty Acids (FISH OIL) 1200 MG CAPS Take 1,000 mg by mouth daily.     [name]. omeprazole (PRILOSEC) 40 MG capsule Take 1 capsule (40 mg total) by mouth daily. 90 capsule 3  . tamsulosin (FLOMAX) 0.4 MG CAPS capsule Take 1 capsule (0.4 mg total) by mouth daily. 90 capsule 3   No current facility-administered medications for this visit.     Allergies-reviewed and updated Allergies  Allergen Reactions  . Simvastatin Other (See Comments)    Leg pain     Social History   Socioeconomic History  . Marital status: Married    Spouse name: None  . Number of children: None  . Years of education: None  . Highest education level: None  Social Needs  . Financial resource strain: None  . Food insecurity - worry: None  . Food insecurity - inability: None  . Transportation needs - medical: None  . Transportation needs - non-medical: None  Occupational History  .  None  Tobacco Use  . Smoking status: Never Smoker  . Smokeless tobacco: Never Used  Substance and Sexual Activity  . Alcohol use: No  . Drug use: No  . Sexual activity: None  Other Topics Concern  . None  Social History Narrative   Married. Wife will be patient of Dr. SwazilandJordan. 3 grown children, 3 grown stepkids. 9 grandkids.       Retired from [employer] service. Over 46 years.     Objective: BP 134/76 (BP Location: Left Arm, Patient Position: Sitting, Cuff Size: Large)   Pulse 76   Temp 98 F (36.7 C) (Oral)   Ht 5' 7.5" (1.715 m)   Wt 193 lb 12.8 oz (87.9 kg)   SpO2 97%   BMI 29.91 kg/m  Gen: NAD, resting comfortably HEENT: Mucous membranes are moist. Oropharynx normal Neck: mild right sided thyromegaly without obvious cyst/mass.  CV: RRR no murmurs rubs or gallops Lungs: CTAB no crackles, wheeze,  rhonchi Abdomen: soft/nontender/nondistended/normal bowel sounds. No rebound or guarding.  Ext: no edema Skin: warm, dry Neuro: grossly normal, moves all extremities, PERRLA  Rectal deferred to urology  Assessment/Plan:  76 y.o. male presenting for annual physical.  Health Maintenance counseling: 1. Anticipatory guidance: Patient counseled regarding regular dental exams -q6 months advised- has been missing, eye exams -yearly, wearing seatbelts.  2. Risk factor reduction:  Advised patient of need for regular exercise and diet rich and fruits and vegetables to reduce risk of heart attack and stroke. Exercise- keeping up the [employer] 6 days a week- added in 10 minutes walking as well as his regular exercises. Diet-has lost 8 lbs- wants to get to 189. Has done a great job- walking and improved eating habits.  Wt Readings from Last 3 Encounters:  09/07/17 193 lb 12.8 oz (87.9 kg)  06/20/17 201 lb 6.4 oz (91.4 kg)  01/27/17 206 lb 9.6 oz (93.7 kg)  3. Immunizations/screenings/ancillary studies- discussed shingrix vaccination availability issues  Immunization History  Administered Date(s) Administered  . Influenza, High Dose Seasonal PF 08/28/2017  . Influenza-Unspecified 07/21/2016  . Pneumococcal Conjugate-13 08/16/2014  . Pneumococcal Polysaccharide-23 07/08/2010  . Tdap 07/08/2010  4. Prostate cancer screening- per urology , they will also do urine  5. Colon cancer screening - 09/2015 with 3 adenomas- 3 year follow up planned 6. Skin cancer screening- no dermatologist. advised regular sunscreen use. Denies worrisome, changing, or new skin lesions.   Status of chronic or acute concerns   Low back pain- prednisone given about 3 months ago (seemed to help some) and advised Dr. Ethelene Halamos follow up if not improved- but couldn't get in so saw Dr. Dutch QuintPoole with neurosurgery. Ended up needing epidural steroid injections- may need another round.   Right thyroid enlargement/hyperlipidemia- update  tsh  Hyperlipidemia HLD- on atorvastatin 40mg , update lipids   Essential hypertension HTN-at goal on metoprolol 25mg  BID. He denies diagnosis but was listed at outside office  GERD (gastroesophageal reflux disease) GERD- remains on prilosec 40mg . History esophageal dilation. Patient states GI wanted him on higher dose- though we discussed trial 20mg - may consider next year if insurance will cover.   BPH (benign prostatic hyperplasia) BPH- remains on flomax. Sees urology for this- prostate cancer screening per urology  CAD (coronary artery disease) CAD- compliant with aspirin, atorvastatin 40mg , metoprolol 25mg  BID. Seen by Dr. Elease HashimotoNahser in April . Luckily asymptomatic  Return in about 6 months (around 03/08/2018) for follow up- or sooner if needed.  Orders Placed This Encounter  Procedures  .  CBC    [place]  . Comprehensive metabolic panel    [place]  . LDL cholesterol, direct    [place]  . TSH    [place]   Meds ordered this encounter  Medications  . omeprazole (PRILOSEC) 40 MG capsule    Sig: Take 1 capsule (40 mg total) by mouth daily.    Dispense:  90 capsule    Refill:  3  . tamsulosin (FLOMAX) 0.4 MG CAPS capsule    Sig: Take 1 capsule (0.4 mg total) by mouth daily.    Dispense:  90 capsule    Refill:  3   Return precautions advised.  [name], MD

## 2017-09-07 NOTE — Assessment & Plan Note (Signed)
HLD- on atorvastatin 40mg , update lipids

## 2017-09-07 NOTE — Patient Instructions (Addendum)
[facility] Thanks for the updates  Refills as below

## 2017-09-07 NOTE — Assessment & Plan Note (Signed)
BPH- remains on flomax. Sees urology for this- prostate cancer screening per urology

## 2017-09-07 NOTE — Assessment & Plan Note (Signed)
GERD- remains on prilosec 40mg . History esophageal dilation. Patient states GI wanted him on higher dose- though we discussed trial 20mg - may consider next year if insurance will cover.

## 2017-10-20 LAB — PSA: PSA: 0.74

## 2017-11-08 ENCOUNTER — Ambulatory Visit: Payer: Medicare HMO | Admitting: *Deleted

## 2017-11-10 DIAGNOSIS — Z6830 Body mass index (BMI) 30.0-30.9, adult: Secondary | ICD-10-CM | POA: Diagnosis not present

## 2017-11-10 DIAGNOSIS — M5416 Radiculopathy, lumbar region: Secondary | ICD-10-CM | POA: Diagnosis not present

## 2018-03-01 ENCOUNTER — Encounter: Payer: Self-pay | Admitting: *Deleted

## 2018-03-01 NOTE — Progress Notes (Signed)
Subjective:   [name] [name] is a 77 y.o. male who presents for Medicare Annual/Subsequent preventive examination.  Reports health as good  Tries to stay active  Last OV 09/2017  PTCA Jan 2002  Back was better; but then had acute pain and went to [facility] and couldn't see them and went to [facility] for injection Nov 9, 09/2018 and another on the June 12th    Married  3 children; 9 grands Retired Research officer, political party   Diet BMI 29  Trig 105 Drinks 2 cups coffee in the am One or two in the evening if bowling    Exercise Does have to climb stairs from the garage under the house to the home [employer] x 6 days a week-  Light cardio; some weights  Backing off some now x 1 year ago; energy is not has high Also had some back issues that hs impacted his exercise Bowling Monday and Wed  Normal seated elliptical x 15 minutes and bike  Weights 2 sets of 12   Back does not hurt when sitting  States most of this is due to bowling   Sleep; q 2 hours to the bathroom Goes to bed at 11 -7 but up x 4 times Wife states he snores on his back     There are no preventive care reminders to display for this patient.  Colonoscopy 09/2010 found a large polyp and came back in one year 2012: Per Dr. Pamala Hurry note; colonoscopy 09/2015 and due this year -  Feels this is correct -  Doctor is on [address] - Dr Marlynn Perking regarding shingrix psa per urology   No dermatologist   Educated regarding the shingrix   Cardiac Risk Factors include: advanced age (>37men, >57 women);dyslipidemia;family history of premature cardiovascular disease;male gender     Objective:    Vitals: BP 138/84   Pulse 85   Ht  (1.727 m)   Wt 196 lb 2 oz (89 kg)   SpO2 99%   BMI 29.82 kg/m   Body mass index is 29.82 kg/m.  Advanced Directives 03/02/2018  Does Patient Have a Medical Advance Directive? No   Advanced Directive; Reviewed advanced directive and agreed to receipt of information  and discussion.  Focused face to face x  20 minutes discussing HCPOA and Living will and reviewed all the questions in the [facility] forms. The patient voices understanding of HCPOA; LW reviewed and information provided on each question. Educated on how to revoke this HCPOA or LW at any time.   Also  discussed life prolonging measures (given a few examples) and where he could choose to initiate or not;  the ability to given the HCPOA power to change his living will or not if he cannot speak for himself; as well as finalizing the will by 2 unrelated witnesses and notary.  Will call for questions and given information on [facility] pastoral department for further assistance.     Tobacco Social History   Tobacco Use  Smoking Status Never Smoker  Smokeless Tobacco Never Used     Counseling given: Yes   Clinical Intake:   Past Medical History:  Diagnosis Date  . Coronary artery disease    status post PTCA and stenting of his left circumflex artery and right coronary artery  . Hyperlipidemia   . Reflux    omeprazole    Past Surgical History:  Procedure Laterality Date  . Benign Cyst  on back  . CARDIOVASCULAR STRESS TEST  05-09-2007   EF 64%  . carpal tunnel left    . CYSTECTOMY     removed from finger  . right knee surgery     arthroscopyc 02/2010   Family History  Problem Relation Age of Onset  . Bladder Cancer Mother        passed age 36.   [name] Heart attack Father        59 passed from this  . Alcohol abuse Father    Social History   Socioeconomic History  . Marital status: Married    Spouse name: Not on file  . Number of children: Not on file  . Years of education: Not on file  . Highest education level: Not on file  Occupational History  . Not on file  Social Needs  . Financial resource strain: Not on file  . Food insecurity:    Worry: Not on file    Inability: Not on file  . Transportation needs:    Medical: Not on file    Non-medical: Not on file    Tobacco Use  . Smoking status: Never Smoker  . Smokeless tobacco: Never Used  Substance and Sexual Activity  . Alcohol use: No  . Drug use: No  . Sexual activity: Not on file  Lifestyle  . Physical activity:    Days per week: Not on file    Minutes per session: Not on file  . Stress: Not on file  Relationships  . Social connections:    Talks on phone: Not on file    Gets together: Not on file    Attends religious service: Not on file    Active member of club or organization: Not on file    Attends meetings of clubs or organizations: Not on file    Relationship status: Not on file  Other Topics Concern  . Not on file  Social History Narrative   Married. Wife will be patient of Dr. Swaziland. 3 grown children, 3 grown stepkids. 9 grandkids.       Retired from [employer]. Over 46 years.     Outpatient Encounter Medications as of 03/02/2018  Medication Sig  . aspirin 81 MG tablet Take 1 tablet (81 mg total) by mouth daily.  [name] atorvastatin (LIPITOR) 40 MG tablet Take 1 tablet (40 mg total) by mouth daily.  . Calcium Carbonate-Vitamin D (CALCIUM + D PO) Take 1 tablet by mouth daily.   [name] glucosamine-chondroitin 500-400 MG tablet Take 1 tablet by mouth 2 (two) times daily.  . Melatonin 10 MG TABS Take by mouth.  . metoprolol tartrate (LOPRESSOR) 25 MG tablet Take 1 tablet (25 mg total) by mouth 2 (two) times daily.  . Multiple Vitamin (MULTIVITAMIN PO) Take 1 tablet by mouth daily.   . Omega-3 Fatty Acids (FISH OIL) 1200 MG CAPS Take 1,000 mg by mouth daily.   [name] omeprazole (PRILOSEC) 40 MG capsule Take 1 capsule (40 mg total) by mouth daily.  . tamsulosin (FLOMAX) 0.4 MG CAPS capsule Take 1 capsule (0.4 mg total) by mouth daily.   No facility-administered encounter medications on file as of 03/02/2018.     Activities of Daily Living In your present state of health, do you have any difficulty performing the following activities: 03/02/2018  Hearing? N  Vision? N  Difficulty  concentrating or making decisions? N  Comment sometimes want remember a word   Walking or climbing stairs? Y  Dressing or bathing?  N  Doing errands, shopping? N  Preparing Food and eating ? N  Using the Toilet? N  In the past six months, have you accidently leaked urine? N  Comment followed by urology for issues with urgency  Do you have problems with loss of bowel control? N  Managing your Medications? N  Managing your Finances? N  Housekeeping or managing your Housekeeping? N  Some recent data might be hidden    Patient Care Team: Shelva Majestic, MD as PCP - General (Family Medicine)   Assessment:   This is a routine wellness examination for [place].  Exercise Activities and Dietary recommendations Current Exercise Habits: Home exercise routine;Structured exercise class, Time (Minutes): 60, Frequency (Times/Week): 4, Weekly Exercise (Minutes/Week): 240, Intensity: Moderate  Goals    . Weight (lb) < 188 lb (85.3 kg)     Stop eating late at hs. Curtail bread, sugar, sweets  Check out  online nutrition programs as WikiBlast.com.cy and LimitLaws.com.cy; fit56me; Look for foods with "whole" wheat; bran; oatmeal etc Shot at the farmer's markets in season for fresher choices  Watch for "hydrogenated" on the label of oils which are trans-fats.  Watch for "high fructose corn syrup" in snacks, yogurt or ketchup  Meats have less marbling; bright colored fruits and vegetables;  Canned; dump out liquid and wash vegetables. Be mindful of what we are eating  Portion control is essential to a health weight! Sit down; take a break and enjoy your meal; take smaller bites; put the fork down between bites;  It takes 20 minutes to get full; so check in with your fullness cues and stop eating when you start to fill full              Fall Risk Fall Risk  03/02/2018 09/07/2017 07/28/2016  Falls in the past year? No No No    Depression Screen PHQ 2/9 Scores 03/02/2018 09/07/2017  07/28/2016  PHQ - 2 Score 0 0 0    Cognitive Function MMSE - Mini Mental State Exam 03/02/2018  Not completed: (No Data)     Ad8 score reviewed for issues:  Issues making decisions:  Less interest in hobbies / activities:  Repeats questions, stories (family complaining):  Trouble using ordinary gadgets (microwave, computer, phone):  Forgets the month or year:   Mismanaging finances:   Remembering appts:  Daily problems with thinking and/or memory: Ad8 score is=0        Immunization History  Administered Date(s) Administered  . Influenza, High Dose Seasonal PF 08/28/2017  . Influenza-Unspecified 07/21/2016  . Pneumococcal Conjugate-13 08/16/2014  . Pneumococcal Polysaccharide-23 07/08/2010  . Tdap 07/08/2010      Screening Tests Health Maintenance  Topic Date Due  . INFLUENZA VACCINE  05/04/2018  . TETANUS/TDAP  07/08/2020  . COLONOSCOPY  09/07/2020  . PNA vac Low Risk Adult  Completed        Plan:      PCP Notes   Health Maintenance Educated regarding the shingrix  Feels colonoscopy is due later this year; competed by Dr. Kenna Gilbert practice in [place]   Abnormal Screens  Hearing in right ear   Referrals  none  Patient concerns;  Apt June 12 with [facility] for one more back injection  C/o of some weakness in quads with small amount of exercise but improved once stopped; as climbing stairs ot walking short distances  Feeling more tired than usual. States vit d has been low in the past    Also has fractured  sleep at hs; sometimes waking up 4 times a night;  Will lie down during the day, but does not sleep during the day   Does not feel this is related to urinary issues  (was taking anxiety med but this made him dizzy)  Not dizzy now although slight dizziness this am (lightheadedness)   Depression negative but mood low at times; anxious  Worried about his bowling score; has to be "competetive to enjoy it"  Overall he feels  his biggest problem is sleep Apt with Dr. Durene Cal made to review his sleep issues; fatigue, muscle pain.    Nurse Concerns; As noted  Next PCP apt 6/28      I have personally reviewed and noted the following in the patient's chart:   . Medical and social history . Use of alcohol, tobacco or illicit drugs  . Current medications and supplements . Functional ability and status . Nutritional status . Physical activity . Advanced directives . List of other physicians . Hospitalizations, surgeries, and ER visits in previous 12 months . Vitals . Screenings to include cognitive, depression, and falls . Referrals and appointments  In addition, I have reviewed and discussed with patient certain preventive protocols, quality metrics, and best practice recommendations. A written personalized care plan for preventive services as well as general preventive health recommendations were provided to patient.     Montine Circle, RN  03/02/2018

## 2018-03-02 ENCOUNTER — Ambulatory Visit (INDEPENDENT_AMBULATORY_CARE_PROVIDER_SITE_OTHER): Payer: Medicare HMO | Admitting: *Deleted

## 2018-03-02 VITALS — BP 138/84 | HR 85 | Ht 68.0 in | Wt 196.1 lb

## 2018-03-02 DIAGNOSIS — Z0000 Encounter for general adult medical examination without abnormal findings: Secondary | ICD-10-CM

## 2018-03-02 NOTE — Patient Instructions (Addendum)
[name] [name] , Thank you for taking time to come for your Medicare Wellness Visit. I appreciate your ongoing commitment to your health goals. Please review the following plan we discussed and let me know if I can assist you in the future.   To fup with Dr. Durene Cal with an apt to discuss your current issues before leaving today   1. guads and pain 2. Vit D 3 Sleep issues  Will make another apt to see Health coach   Recommend you see a dermatologist for regular skin checks   Shingrix is a vaccine for the prevention of Shingles in Adults 50 and older.  If you are on Medicare, the shingrix is covered under your Part D plan, so you will take both of the vaccines in the series at your pharmacy. Please check with your benefits regarding applicable copays or out of pocket expenses.  The Shingrix is given in 2 vaccines approx 8 weeks apart. You must receive the 2nd dose prior to 6 months from receipt of the first. Please have the pharmacist print out you Immunization  dates for our office records   Will try to complete AD; Given copy  Referred to [facility] for questions [place] offers free advance directive forms, as well as assistance in completing the forms themselves. For assistance, contact the Spiritual Care Department at [phone], or the Clinical Social Work Department at [phone].      These are the goals we discussed: Goals    . Weight (lb) < 188 lb (85.3 kg)     Stop eating late at hs. Curtail bread, sugar, sweets  Check out  online nutrition programs as WikiBlast.com.cy and LimitLaws.com.cy; fit44me; Look for foods with "whole" wheat; bran; oatmeal etc Shot at the farmer's markets in season for fresher choices  Watch for "hydrogenated" on the label of oils which are trans-fats.  Watch for "high fructose corn syrup" in snacks, yogurt or ketchup  Meats have less marbling; bright colored fruits and vegetables;  Canned; dump out liquid and wash vegetables. Be mindful of what  we are eating  Portion control is essential to a health weight! Sit down; take a break and enjoy your meal; take smaller bites; put the fork down between bites;  It takes 20 minutes to get full; so check in with your fullness cues and stop eating when you start to fill full              This is a list of the screening recommended for you and due dates:  Health Maintenance  Topic Date Due  . Flu Shot  05/04/2018  . Tetanus Vaccine  07/08/2020  . Colon Cancer Screening  09/07/2020  . Pneumonia vaccines  Completed    Insomnia Insomnia is a sleep disorder that makes it difficult to fall asleep or to stay asleep. Insomnia can cause tiredness (fatigue), low energy, difficulty concentrating, mood swings, and poor performance at work or school. There are three different ways to classify insomnia:  Difficulty falling asleep.  Difficulty staying asleep.  Waking up too early in the morning.  Any type of insomnia can be long-term (chronic) or short-term (acute). Both are common. Short-term insomnia usually lasts for three months or less. Chronic insomnia occurs at least three times a week for longer than three months. What are the causes? Insomnia may be caused by another condition, situation, or substance, such as:  Anxiety.  Certain medicines.  Gastroesophageal reflux disease (GERD) or other gastrointestinal conditions.  Asthma or other  breathing conditions.  Restless legs syndrome, sleep apnea, or other sleep disorders.  Chronic pain.  Menopause. This may include hot flashes.  Stroke.  Abuse of alcohol, tobacco, or illegal drugs.  Depression.  Caffeine.  Neurological disorders, such as Alzheimer disease.  An overactive thyroid (hyperthyroidism).  The cause of insomnia may not be known. What increases the risk? Risk factors for insomnia include:  Gender. Women are more commonly affected than men.  Age. Insomnia is more common as you get older.  Stress. This  may involve your professional or personal life.  Income. Insomnia is more common in people with lower income.  Lack of exercise.  Irregular work schedule or night shifts.  Traveling between different time zones.  What are the signs or symptoms? If you have insomnia, trouble falling asleep or trouble staying asleep is the main symptom. This may lead to other symptoms, such as:  Feeling fatigued.  Feeling nervous about going to sleep.  Not feeling rested in the morning.  Having trouble concentrating.  Feeling irritable, anxious, or depressed.  How is this treated? Treatment for insomnia depends on the cause. If your insomnia is caused by an underlying condition, treatment will focus on addressing the condition. Treatment may also include:  Medicines to help you sleep.  Counseling or therapy.  Lifestyle adjustments.  Follow these instructions at home:  Take medicines only as directed by your health care provider.  Keep regular sleeping and waking hours. Avoid naps.  Keep a sleep diary to help you and your health care provider figure out what could be causing your insomnia. Include: ? When you sleep. ? When you wake up during the night. ? How well you sleep. ? How rested you feel the next day. ? Any side effects of medicines you are taking. ? What you eat and drink.  Make your bedroom a comfortable place where it is easy to fall asleep: ? Put up shades or special blackout curtains to block light from outside. ? Use a white noise machine to block noise. ? Keep the temperature cool.  Exercise regularly as directed by your health care provider. Avoid exercising right before bedtime.  Use relaxation techniques to manage stress. Ask your health care provider to suggest some techniques that may work well for you. These may include: ? Breathing exercises. ? Routines to release muscle tension. ? Visualizing peaceful scenes.  Cut back on alcohol, caffeinated beverages,  and cigarettes, especially close to bedtime. These can disrupt your sleep.  Do not overeat or eat spicy foods right before bedtime. This can lead to digestive discomfort that can make it hard for you to sleep.  Limit screen use before bedtime. This includes: ? Watching TV. ? Using your smartphone, tablet, and computer.  Stick to a routine. This can help you fall asleep faster. Try to do a quiet activity, brush your teeth, and go to bed at the same time each night.  Get out of bed if you are still awake after 15 minutes of trying to sleep. Keep the lights down, but try reading or doing a quiet activity. When you feel sleepy, go back to bed.  Make sure that you drive carefully. Avoid driving if you feel very sleepy.  Keep all follow-up appointments as directed by your health care provider. This is important. Contact a health care provider if:  You are tired throughout the day or have trouble in your daily routine due to sleepiness.  You continue to have sleep problems or your  sleep problems get worse. Get help right away if:  You have serious thoughts about hurting yourself or someone else. This information is not intended to replace advice given to you by your health care provider. Make sure you discuss any questions you have with your health care provider. Document Released: 09/17/2000 Document Revised: 02/20/2016 Document Reviewed: 06/21/2014 Elsevier Interactive Patient Education  2018 [employer].   Fall Prevention in the Home Falls can cause injuries. They can happen to people of all ages. There are many things you can do to make your home safe and to help prevent falls. What can I do on the outside of my home?  Regularly fix the edges of walkways and driveways and fix any cracks.  Remove anything that might make you trip as you walk through a door, such as a raised step or threshold.  Trim any bushes or trees on the path to your home.  Use bright outdoor  lighting.  Clear any walking paths of anything that might make someone trip, such as rocks or tools.  Regularly check to see if handrails are loose or broken. Make sure that both sides of any steps have handrails.  Any raised decks and porches should have guardrails on the edges.  Have any leaves, snow, or ice cleared regularly.  Use sand or salt on walking paths during winter.  Clean up any spills in your garage right away. This includes oil or grease spills. What can I do in the bathroom?  Use night lights.  Install grab bars by the toilet and in the tub and shower. Do not use towel bars as grab bars.  Use non-skid mats or decals in the tub or shower.  If you need to sit down in the shower, use a plastic, non-slip stool.  Keep the floor dry. Clean up any water that spills on the floor as soon as it happens.  Remove soap buildup in the tub or shower regularly.  Attach bath mats securely with double-sided non-slip rug tape.  Do not have throw rugs and other things on the floor that can make you trip. What can I do in the bedroom?  Use night lights.  Make sure that you have a light by your bed that is easy to reach.  Do not use any sheets or blankets that are too big for your bed. They should not hang down onto the floor.  Have a firm chair that has side arms. You can use this for support while you get dressed.  Do not have throw rugs and other things on the floor that can make you trip. What can I do in the kitchen?  Clean up any spills right away.  Avoid walking on wet floors.  Keep items that you use a lot in easy-to-reach places.  If you need to reach something above you, use a strong step stool that has a grab bar.  Keep electrical cords out of the way.  Do not use floor polish or wax that makes floors slippery. If you must use wax, use non-skid floor wax.  Do not have throw rugs and other things on the floor that can make you trip. What can I do with my  stairs?  Do not leave any items on the stairs.  Make sure that there are handrails on both sides of the stairs and use them. Fix handrails that are broken or loose. Make sure that handrails are as long as the stairways.  Check any carpeting to  make sure that it is firmly attached to the stairs. Fix any carpet that is loose or worn.  Avoid having throw rugs at the top or bottom of the stairs. If you do have throw rugs, attach them to the floor with carpet tape.  Make sure that you have a light switch at the top of the stairs and the bottom of the stairs. If you do not have them, ask someone to add them for you. What else can I do to help prevent falls?  Wear shoes that: ? Do not have high heels. ? Have rubber bottoms. ? Are comfortable and fit you well. ? Are closed at the toe. Do not wear sandals.  If you use a stepladder: ? Make sure that it is fully opened. Do not climb a closed stepladder. ? Make sure that both sides of the stepladder are locked into place. ? Ask someone to hold it for you, if possible.  Clearly mark and make sure that you can see: ? Any grab bars or handrails. ? First and last steps. ? Where the edge of each step is.  Use tools that help you move around (mobility aids) if they are needed. These include: ? Canes. ? Walkers. ? Scooters. ? Crutches.  Turn on the lights when you go into a dark area. Replace any light bulbs as soon as they burn out.  Set up your furniture so you have a clear path. Avoid moving your furniture around.  If any of your floors are uneven, fix them.  If there are any pets around you, be aware of where they are.  Review your medicines with your doctor. Some medicines can make you feel dizzy. This can increase your chance of falling. Ask your doctor what other things that you can do to help prevent falls. This information is not intended to replace advice given to you by your health care provider. Make sure you discuss any  questions you have with your health care provider. Document Released: 07/17/2009 Document Revised: 02/26/2016 Document Reviewed: 10/25/2014 Elsevier Interactive Patient Education  2018 [employer].   Health Maintenance, Male A healthy lifestyle and preventive care is important for your health and wellness. Ask your health care provider about what schedule of regular examinations is right for you. What should I know about weight and diet? Eat a Healthy Diet  Eat plenty of vegetables, fruits, whole grains, low-fat dairy products, and lean protein.  Do not eat a lot of foods high in solid fats, added sugars, or salt.  Maintain a Healthy Weight Regular exercise can help you achieve or maintain a healthy weight. You should:  Do at least 150 minutes of exercise each week. The exercise should increase your heart rate and make you sweat (moderate-intensity exercise).  Do strength-training exercises at least twice a week.  Watch Your Levels of Cholesterol and Blood Lipids  Have your blood tested for lipids and cholesterol every 5 years starting at 75 years of age. If you are at high risk for heart disease, you should start having your blood tested when you are 75 years old. You may need to have your cholesterol levels checked more often if: ? Your lipid or cholesterol levels are high. ? You are older than 75 years of age. ? You are at high risk for heart disease.  What should I know about cancer screening? Many types of cancers can be detected early and may often be prevented. Lung Cancer  You should be screened every  year for lung cancer if: ? You are a current smoker who has smoked for at least 30 years. ? You are a former smoker who has quit within the past 15 years.  Talk to your health care provider about your screening options, when you should start screening, and how often you should be screened.  Colorectal Cancer  Routine colorectal cancer screening usually begins at 75  years of age and should be repeated every 5-10 years until you are 75 years old. You may need to be screened more often if early forms of precancerous polyps or small growths are found. Your health care provider may recommend screening at an earlier age if you have risk factors for colon cancer.  Your health care provider may recommend using home test kits to check for hidden blood in the stool.  A small camera at the end of a tube can be used to examine your colon (sigmoidoscopy or colonoscopy). This checks for the earliest forms of colorectal cancer.  Prostate and Testicular Cancer  Depending on your age and overall health, your health care provider may do certain tests to screen for prostate and testicular cancer.  Talk to your health care provider about any symptoms or concerns you have about testicular or prostate cancer.  Skin Cancer  Check your skin from head to toe regularly.  Tell your health care provider about any new moles or changes in moles, especially if: ? There is a change in a mole's size, shape, or color. ? You have a mole that is larger than a pencil eraser.  Always use sunscreen. Apply sunscreen liberally and repeat throughout the day.  Protect yourself by wearing long sleeves, pants, a wide-brimmed hat, and sunglasses when outside.  What should I know about heart disease, diabetes, and high blood pressure?  If you are 35-82 years of age, have your blood pressure checked every 3-5 years. If you are 20 years of age or older, have your blood pressure checked every year. You should have your blood pressure measured twice-once when you are at a hospital or clinic, and once when you are not at a hospital or clinic. Record the average of the two measurements. To check your blood pressure when you are not at a hospital or clinic, you can use: ? An automated blood pressure machine at a pharmacy. ? A home blood pressure monitor.  Talk to your health care provider about your  target blood pressure.  If you are between 42-9 years old, ask your health care provider if you should take aspirin to prevent heart disease.  Have regular diabetes screenings by checking your fasting blood sugar level. ? If you are at a normal weight and have a low risk for diabetes, have this test once every three years after the age of 60. ? If you are overweight and have a high risk for diabetes, consider being tested at a younger age or more often.  A one-time screening for abdominal aortic aneurysm (AAA) by ultrasound is recommended for men aged 65-75 years who are current or former smokers. What should I know about preventing infection? Hepatitis B If you have a higher risk for hepatitis B, you should be screened for this virus. Talk with your health care provider to find out if you are at risk for hepatitis B infection. Hepatitis C Blood testing is recommended for:  Everyone born from [birth year] through 1963.  Anyone with known risk factors for hepatitis C.  Sexually Transmitted Diseases (STDs)  You should be screened each year for STDs including gonorrhea and chlamydia if: ? You are sexually active and are younger than 75 years of age. ? You are older than 75 years of age and your health care provider tells you that you are at risk for this type of infection. ? Your sexual activity has changed since you were last screened and you are at an increased risk for chlamydia or gonorrhea. Ask your health care provider if you are at risk.  Talk with your health care provider about whether you are at high risk of being infected with HIV. Your health care provider may recommend a prescription medicine to help prevent HIV infection.  What else can I do?  Schedule regular health, dental, and eye exams.  Stay current with your vaccines (immunizations).  Do not use any tobacco products, such as cigarettes, chewing tobacco, and e-cigarettes. If you need help quitting, ask your health care  provider.  Limit alcohol intake to no more than 2 drinks per day. One drink equals 12 ounces of beer, 5 ounces of wine, or 1 ounces of hard liquor.  Do not use street drugs.  Do not share needles.  Ask your health care provider for help if you need support or information about quitting drugs.  Tell your health care provider if you often feel depressed.  Tell your health care provider if you have ever been abused or do not feel safe at home. This information is not intended to replace advice given to you by your health care provider. Make sure you discuss any questions you have with your health care provider. Document Released: 03/18/2008 Document Revised: 05/19/2016 Document Reviewed: 06/24/2015 Elsevier Interactive Patient Education  [employer].

## 2018-03-02 NOTE — Progress Notes (Signed)
I have reviewed the documentation for AWV and Advanced Care Planning provided by the Health Coach and agree with the documentation. I was immediately available for any questions.    I am thankful for scheduled follow-up with me to go over patient concerns.  Happy to see him sooner if needed  [name], MD

## 2018-03-08 ENCOUNTER — Ambulatory Visit: Payer: Medicare HMO | Admitting: *Deleted

## 2018-03-15 DIAGNOSIS — M48062 Spinal stenosis, lumbar region with neurogenic claudication: Secondary | ICD-10-CM | POA: Diagnosis not present

## 2018-03-15 DIAGNOSIS — M5416 Radiculopathy, lumbar region: Secondary | ICD-10-CM | POA: Diagnosis not present

## 2018-03-17 ENCOUNTER — Telehealth: Payer: Self-pay | Admitting: Cardiovascular Disease

## 2018-03-17 ENCOUNTER — Encounter: Payer: Self-pay | Admitting: Cardiovascular Disease

## 2018-03-17 ENCOUNTER — Ambulatory Visit: Payer: Medicare HMO | Admitting: Cardiovascular Disease

## 2018-03-17 VITALS — BP 138/80 | HR 73 | Ht 68.0 in | Wt 193.4 lb

## 2018-03-17 DIAGNOSIS — I739 Peripheral vascular disease, unspecified: Secondary | ICD-10-CM

## 2018-03-17 DIAGNOSIS — I2510 Atherosclerotic heart disease of native coronary artery without angina pectoris: Secondary | ICD-10-CM

## 2018-03-17 LAB — HEPATIC FUNCTION PANEL
ALT: 21 IU/L (ref 0–44)
AST: 21 IU/L (ref 0–40)
Albumin: 4.8 g/dL (ref 3.5–4.8)
Alkaline Phosphatase: 63 IU/L (ref 39–117)
Bilirubin Total: 0.5 mg/dL (ref 0.0–1.2)
Bilirubin, Direct: 0.14 mg/dL (ref 0.00–0.40)
Total Protein: 7.4 g/dL (ref 6.0–8.5)

## 2018-03-17 LAB — LIPID PANEL
Chol/HDL Ratio: 3.2 ratio (ref 0.0–5.0)
Cholesterol, Total: 126 mg/dL (ref 100–199)
HDL: 39 mg/dL — ABNORMAL LOW (ref >39)
LDL Calculated: 76 mg/dL (ref 0–99)
Triglycerides: 57 mg/dL (ref 0–149)
VLDL Cholesterol Cal: 11 mg/dL (ref 5–40)

## 2018-03-17 LAB — BASIC METABOLIC PANEL
BUN/Creatinine Ratio: 24 (ref 10–24)
BUN: 18 mg/dL (ref 8–27)
CO2: 24 mmol/L (ref 20–29)
Calcium: 10 mg/dL (ref 8.6–10.2)
Chloride: 101 mmol/L (ref 96–106)
Creatinine, Ser: 0.76 mg/dL (ref 0.76–1.27)
GFR calc Af Amer: 104 mL/min/{1.73_m2} (ref >59)
GFR calc non Af Amer: 90 mL/min/{1.73_m2} (ref >59)
Glucose: 89 mg/dL (ref 65–99)
Potassium: 4.4 mmol/L (ref 3.5–5.2)
Sodium: 140 mmol/L (ref 134–144)

## 2018-03-17 MED ORDER — ATORVASTATIN CALCIUM 40 MG PO TABS: 40.0000 mg | ORAL_TABLET | Freq: Every day | ORAL | 3 refills | Status: DC

## 2018-03-17 MED ORDER — METOPROLOL TARTRATE 25 MG PO TABS: 25.0000 mg | ORAL_TABLET | Freq: Two times a day (BID) | ORAL | 3 refills | Status: DC

## 2018-03-17 NOTE — Telephone Encounter (Signed)
New Message    Patient is calling because he decided not to have the LE Arterial study. He wanted to let the provider know.

## 2018-03-17 NOTE — Patient Instructions (Signed)
Medication Instructions:  Your physician recommends that you continue on your current medications as directed. Please refer to the Current Medication list given to you today.   Labwork: TODAY - cholesterol, liver panel, basic metabolic panel   Testing/Procedures: Your physician has requested that you have a lower extremity arterial duplex. This test is an ultrasound of the arteries in the legs or arms. It looks at arterial blood flow in the legs and arms. Allow one hour for Lower and Upper Arterial scans. There are no restrictions or special instructions   Follow-Up: Your physician wants you to follow-up in: 1 year with a PA or NP on Dr. Harvie BridgeNahser's team. You will receive a reminder letter in the mail two months in advance. If you don't receive a letter, please call our office to schedule the follow-up appointment.   If you need a refill on your cardiac medications before your next appointment, please call your pharmacy.   Thank you for choosing [facility]! Eligha BridegroomMichelle Cindra Austad, RN [phone]

## 2018-03-17 NOTE — Telephone Encounter (Signed)
Dr. Nahser is aware 

## 2018-03-17 NOTE — Progress Notes (Signed)
Cardiology Office Note   Date:  03/17/2018   ID:  [name] [name], DOB October 28, 1940, MRN [id number]  PCP:  Shelva Majestic, MD  Cardiologist:   [name], MD   Chief Complaint  Patient presents with  . Coronary Artery Disease   1. Coronary artery disease-status post PTCA and stenting of his left circumflex artery and right coronary artery 2. Hyperlipidemia     77 year old gentleman with a history of coronary artery disease. He status post PTCA and stenting of the left circumflex artery as well as PTCA of the posterior descending artery and January 2002. He's done very well. He has not had any episodes of chest pain or shortness of breath.  January 16, 2014:  [name] [name] feels well - no angina, no dyspnea. He is watching his diet   January 21, 2015:  [name] [name] is a 76 y.o. male who presents for follow-up of his coronary artery disease. Still bowling  [facility]  Does some cardio.   January 21, 2016:  [name] [name] is seen today for his CAD.    Has some muscle stiffness.  No muscle aches.  Has done some stretches.   January 27, 2017:  Having some muscle pull issus Having some ortho issues.  No CP  Is now changing Dr. [name] at [place].  Has stopped his Niacin .   Still goes to the [facility].  Does light cardio and some machines.   March 17, 2018:  No CP , some dyspnea.   Especially with walking . Has some thigh pain with exercise.   Does not walk regularly  [facility].   Rides a bike, walks on treadmill , works out on [employer] , seated ellipitical    Past Medical History:  Diagnosis Date  . Coronary artery disease    status post PTCA and stenting of his left circumflex artery and right coronary artery  . Hyperlipidemia   . Reflux    omeprazole 40mg     Past Surgical History:  Procedure Laterality Date  . Benign Cyst     on back  . CARDIOVASCULAR STRESS TEST  05-09-2007   EF 64%  . carpal tunnel left     . CYSTECTOMY     removed from finger  . right knee surgery     arthroscopyc 02/2010     Current Outpatient Medications  Medication Sig Dispense Refill  . aspirin 81 MG tablet Take 1 tablet (81 mg total) by mouth daily.    [name] atorvastatin (LIPITOR) 40 MG tablet Take 1 tablet (40 mg total) by mouth daily. 90 tablet 3  . Calcium Carbonate-Vitamin D (CALCIUM + D PO) Take 1 tablet by mouth daily.     [name] glucosamine-chondroitin 500-400 MG tablet Take 1 tablet by mouth 2 (two) times daily.    . Melatonin 10 MG TABS Take 1 tablet by mouth daily.     . metoprolol tartrate (LOPRESSOR) 25 MG tablet Take 1 tablet (25 mg total) by mouth 2 (two) times daily. 180 tablet 3  . Multiple Vitamin (MULTIVITAMIN PO) Take 1 tablet by mouth daily.     . Omega-3 Fatty Acids (FISH OIL) 1200 MG CAPS Take 1,000 mg by mouth daily.     [name] omeprazole (PRILOSEC) 40 MG capsule Take 1 capsule (40 mg total) by mouth daily. 90 capsule 3  . tamsulosin (FLOMAX) 0.4 MG CAPS capsule Take 1 capsule (0.4 mg  total) by mouth daily. 90 capsule 3   No current facility-administered medications for this visit.     Allergies:   Simvastatin    Social History:  The patient  reports that he has never smoked. He has never used smokeless tobacco. He reports that he does not drink alcohol or use drugs.   Family History:  The patient's family history includes Alcohol abuse in his father; Bladder Cancer in his mother; Heart attack in his father.    ROS: Noted in current history, otherwise review of systems is negative.    Physical Exam: Blood pressure 138/80, pulse 73, height 5\' 8"  (1.727 m), weight 193 lb 6.4 oz (87.7 kg), SpO2 99 %.  GEN:  Well nourished, well developed in no acute distress HEENT: Normal NECK: No JVD; No carotid bruits LYMPHATICS: No lymphadenopathy CARDIAC: RR,   RESPIRATORY:  Clear to auscultation without rales, wheezing or rhonchi  ABDOMEN: Soft, non-tender, non-distended MUSCULOSKELETAL:  No edema; No  deformity  SKIN: Warm and dry NEUROLOGIC:  Alert and oriented x 3   EKG:    March 17, 2018:  NSR at 673.  Normal ECG    Recent Labs: 09/07/2017: ALT 22; BUN 18; Creatinine, Ser 0.82; Hemoglobin 15.7; Platelets 244.0; Potassium 4.7; Sodium 138; TSH 0.36    Lipid Panel    Component Value Date/Time   CHOL 134 01/27/2017 1454   TRIG 105 01/27/2017 1454   HDL 37 (L) 01/27/2017 1454   CHOLHDL 3.6 01/27/2017 1454   CHOLHDL 2.9 01/21/2016 0914   VLDL 10 01/21/2016 0914   LDLCALC 76 01/27/2017 1454   LDLDIRECT 74.0 09/07/2017 0904      Wt Readings from Last 3 Encounters:  03/17/18 193 lb 6.4 oz (87.7 kg)  03/02/18 196 lb 2 oz (89 kg)  09/07/17 193 lb 12.8 oz (87.9 kg)      Other studies Reviewed: Additional studies/ records that were reviewed today include: . Review of the above records demonstrates:    ASSESSMENT AND PLAN:  1. Coronary artery disease-status post PTCA and stenting of his left circumflex artery and right coronary artery-   He has not had any episodes of angina.  2. Hyperlipidemia -   check fasting lipids.  Continue current medications.  3. Leg pain.  -Having lots of thigh pain.  I suggested that we check ankle-brachial indexes that he does not want to do this at this point.  The thigh pain is not limiting.  I have advised him of the symptoms of worsening PAD.  Right now he still fairly active and is not limited by his thigh pain.   Current medicines are reviewed at length with the patient today.  The patient does not have concerns regarding medicines.  The following changes have been made:  no change  Labs/ tests ordered today include:  No orders of the defined types were placed in this encounter.    Disposition:   FU with me in 1 year      Signed, Kristeen MissPhilip Corazon Nickolas, MD  03/17/2018 8:53 AM    [facility] [address], [place], [place]  [address] Phone: [phone]) [phone]; Fax: [phone]) [phone]

## 2018-03-30 ENCOUNTER — Encounter (HOSPITAL_COMMUNITY): Payer: Medicare HMO

## 2018-03-31 ENCOUNTER — Ambulatory Visit: Payer: Medicare HMO | Admitting: Family Medicine

## 2018-06-19 ENCOUNTER — Ambulatory Visit: Payer: Medicare HMO | Admitting: Podiatry

## 2018-06-19 ENCOUNTER — Encounter: Payer: Self-pay | Admitting: Podiatry

## 2018-06-19 ENCOUNTER — Ambulatory Visit (INDEPENDENT_AMBULATORY_CARE_PROVIDER_SITE_OTHER): Payer: Medicare HMO

## 2018-06-19 DIAGNOSIS — M722 Plantar fascial fibromatosis: Secondary | ICD-10-CM | POA: Diagnosis not present

## 2018-06-19 MED ORDER — TRIAMCINOLONE ACETONIDE 10 MG/ML IJ SUSP
10.0000 mg | Freq: Once | INTRAMUSCULAR | Status: AC
  Administered 2018-06-19: 10 mg

## 2018-06-19 NOTE — Progress Notes (Signed)
Subjective:    Patient ID: [name] [name], male    DOB: 03/21/1941, 77 y.o.   MRN: [id number]  HPI 77 year old male presents the office today for concerns of left heel pain which is been ongoing for last 1.5 weeks.  He states he has pain in the morning he first gets up or if he is been sitting for some time and stands back up.  He states he gets better as the day progresses.  He is also doing a lot of bowling.  He states it does hurt with variable in shoes.  No recent injury.  No numbness or tingling.  The pain does not wake him up at night.  No other concerns.   Review of Systems  All other systems reviewed and are negative.  Past Medical History:  Diagnosis Date  . Coronary artery disease    status post PTCA and stenting of his left circumflex artery and right coronary artery  . Hyperlipidemia   . Reflux    omeprazole 40mg     Past Surgical History:  Procedure Laterality Date  . Benign Cyst     on back  . CARDIOVASCULAR STRESS TEST  05-09-2007   EF 64%  . carpal tunnel left    . CYSTECTOMY     removed from finger  . right knee surgery     arthroscopyc 02/2010     Current Outpatient Medications:  .  aspirin 81 MG tablet, Take 1 tablet (81 mg total) by mouth daily., Disp: , Rfl:  .  atorvastatin (LIPITOR) 40 MG tablet, Take 1 tablet (40 mg total) by mouth daily., Disp: 90 tablet, Rfl: 3 .  Calcium Carbonate-Vitamin D (CALCIUM + D PO), Take 1 tablet by mouth daily. , Disp: , Rfl:  .  glucosamine-chondroitin 500-400 MG tablet, Take 1 tablet by mouth 2 (two) times daily., Disp: , Rfl:  .  Melatonin 10 MG TABS, Take 1 tablet by mouth daily. , Disp: , Rfl:  .  metoprolol tartrate (LOPRESSOR) 25 MG tablet, Take 1 tablet (25 mg total) by mouth 2 (two) times daily., Disp: 180 tablet, Rfl: 3 .  Multiple Vitamin (MULTIVITAMIN PO), Take 1 tablet by mouth daily. , Disp: , Rfl:  .  Omega-3 Fatty Acids (FISH OIL) 1200 MG CAPS, Take 1,000 mg by mouth daily. , Disp: , Rfl:  .   omeprazole (PRILOSEC) 40 MG capsule, Take 1 capsule (40 mg total) by mouth daily., Disp: 90 capsule, Rfl: 3 .  tamsulosin (FLOMAX) 0.4 MG CAPS capsule, Take 1 capsule (0.4 mg total) by mouth daily., Disp: 90 capsule, Rfl: 3  Allergies  Allergen Reactions  . Simvastatin Other (See Comments)    Leg pain          Objective:   Physical Exam  General: AAO x3, NAD  Dermatological: Skin is warm, dry and supple bilateral. Nails x 10 are well manicured; remaining integument appears unremarkable at this time. There are no open sores, no preulcerative lesions, no rash or signs of infection present.  Vascular: Dorsalis Pedis artery and Posterior Tibial artery pedal pulses are 2/4 bilateral with immedate capillary fill time.  There is no pain with calf compression, swelling, warmth, erythema.   Neruologic: Grossly intact via light touch bilateral.  Protective threshold with Semmes Wienstein monofilament intact to all pedal sites bilateral.  Negative Tinel sign.  Musculoskeletal: Tenderness to palpation along the plantar medial tubercle of the calcaneus at the insertion of plantar fascia on the left foot. There is  no pain along the course of the plantar fascia within the arch of the foot. Plantar fascia appears to be intact. There is no pain with lateral compression of the calcaneus or pain with vibratory sensation. There is no pain along the course or insertion of the achilles tendon. No other areas of tenderness to bilateral lower extremities. Muscular strength 5/5 in all groups tested bilateral.  Gait: Unassisted, Nonantalgic.     Assessment & Plan:  77 year old male with left heel pain, plantar fasciitis -Treatment options discussed including all alternatives, risks, and complications -Etiology of symptoms were discussed -X-rays were obtained and reviewed with the patient.  No evidence of acute fracture or stress fracture identified today.  Vessel calcification was identified.  He has no  clinical signs of PAD and he has no claudication symptoms. -Steroid injection performed.  See procedure note below. -Plantar fascial brace dispensed. -Discussed stretching, icing daily. -Discussed shoe modifications and orthotics  Procedure: Injection Tendon/Ligament Discussed alternatives, risks, complications and verbal consent was obtained.  Location: Left plantar fascia at the glabrous junction; medial approach. Skin Prep: Alcohol. Injectate: 0.5cc 0.5% marcaine plain, 0.5 cc 2% lidocaine plain and, 1 cc kenalog 10. Disposition: Patient tolerated procedure well. Injection site dressed with a band-aid.  Post-injection care was discussed and return precautions discussed.   Return in about 3 weeks (around 07/10/2018) for plantar fasciitis .  Vivi BarrackMatthew R Alishia Lebo DPM

## 2018-06-19 NOTE — Patient Instructions (Signed)

## 2018-06-21 DIAGNOSIS — M722 Plantar fascial fibromatosis: Secondary | ICD-10-CM | POA: Insufficient documentation

## 2018-06-22 DIAGNOSIS — H524 Presbyopia: Secondary | ICD-10-CM | POA: Diagnosis not present

## 2018-06-26 DIAGNOSIS — H524 Presbyopia: Secondary | ICD-10-CM | POA: Diagnosis not present

## 2018-06-26 DIAGNOSIS — H52223 Regular astigmatism, bilateral: Secondary | ICD-10-CM | POA: Diagnosis not present

## 2018-06-30 DIAGNOSIS — M48062 Spinal stenosis, lumbar region with neurogenic claudication: Secondary | ICD-10-CM | POA: Diagnosis not present

## 2018-06-30 DIAGNOSIS — M5416 Radiculopathy, lumbar region: Secondary | ICD-10-CM | POA: Diagnosis not present

## 2018-07-10 ENCOUNTER — Ambulatory Visit: Payer: Medicare HMO | Admitting: Podiatry

## 2018-08-02 DIAGNOSIS — M25521 Pain in right elbow: Secondary | ICD-10-CM | POA: Diagnosis not present

## 2018-08-02 DIAGNOSIS — M79601 Pain in right arm: Secondary | ICD-10-CM | POA: Diagnosis not present

## 2018-08-12 DIAGNOSIS — M25521 Pain in right elbow: Secondary | ICD-10-CM | POA: Diagnosis not present

## 2018-08-17 DIAGNOSIS — M25521 Pain in right elbow: Secondary | ICD-10-CM | POA: Diagnosis not present

## 2018-08-17 DIAGNOSIS — S46291A Other injury of muscle, fascia and tendon of other parts of biceps, right arm, initial encounter: Secondary | ICD-10-CM | POA: Diagnosis not present

## 2018-08-28 DIAGNOSIS — M25521 Pain in right elbow: Secondary | ICD-10-CM | POA: Diagnosis not present

## 2018-08-28 DIAGNOSIS — S46211A Strain of muscle, fascia and tendon of other parts of biceps, right arm, initial encounter: Secondary | ICD-10-CM | POA: Diagnosis not present

## 2018-09-07 ENCOUNTER — Encounter: Payer: Medicare HMO | Admitting: Family Medicine

## 2018-09-08 ENCOUNTER — Other Ambulatory Visit: Payer: Self-pay | Admitting: Family Medicine

## 2018-09-08 MED ORDER — TAMSULOSIN HCL 0.4 MG PO CAPS: 0.4000 mg | ORAL_CAPSULE | Freq: Every day | ORAL | 0 refills | Status: DC

## 2018-09-08 MED ORDER — OMEPRAZOLE 40 MG PO CPDR: 40.0000 mg | DELAYED_RELEASE_CAPSULE | Freq: Every day | ORAL | 0 refills | Status: DC

## 2018-09-08 NOTE — Telephone Encounter (Signed)
Copied from CRM [phone]. Topic: Quick Communication - Rx Refill/Question >> Sep 08, 2018 12:00 PM Mickel BaasMcGee, Arthur Aydelotte B, NT wrote: **Patient requesting a 90 day supply due to insurance purposed.**  Medication: tamsulosin (FLOMAX) 0.4 MG CAPS capsule omeprazole (PRILOSEC) 40 MG capsule  Has the patient contacted their pharmacy? Yes.   (Agent: If no, request that the patient contact the pharmacy for the refill.) (Agent: If yes, when and what did the pharmacy advise?)  Preferred Pharmacy (with phone number or street name): [facility] [place] - [phone]  Agent: Please be advised that RX refills may take up to 3 business days. We ask that you follow-up with your pharmacy.

## 2018-10-24 ENCOUNTER — Encounter

## 2018-10-24 ENCOUNTER — Encounter: Payer: Self-pay | Admitting: Family Medicine

## 2018-10-24 ENCOUNTER — Ambulatory Visit (INDEPENDENT_AMBULATORY_CARE_PROVIDER_SITE_OTHER): Payer: Medicare HMO | Admitting: Family Medicine

## 2018-10-24 VITALS — BP 118/70 | HR 69 | Temp 97.4°F | Ht 68.0 in | Wt 186.6 lb

## 2018-10-24 DIAGNOSIS — E041 Nontoxic single thyroid nodule: Secondary | ICD-10-CM

## 2018-10-24 DIAGNOSIS — Z6828 Body mass index (BMI) 28.0-28.9, adult: Secondary | ICD-10-CM

## 2018-10-24 DIAGNOSIS — Z0000 Encounter for general adult medical examination without abnormal findings: Secondary | ICD-10-CM | POA: Diagnosis not present

## 2018-10-24 DIAGNOSIS — E785 Hyperlipidemia, unspecified: Secondary | ICD-10-CM

## 2018-10-24 DIAGNOSIS — I10 Essential (primary) hypertension: Secondary | ICD-10-CM | POA: Diagnosis not present

## 2018-10-24 DIAGNOSIS — I2510 Atherosclerotic heart disease of native coronary artery without angina pectoris: Secondary | ICD-10-CM | POA: Diagnosis not present

## 2018-10-24 DIAGNOSIS — I739 Peripheral vascular disease, unspecified: Secondary | ICD-10-CM

## 2018-10-24 DIAGNOSIS — Z79899 Other long term (current) drug therapy: Secondary | ICD-10-CM | POA: Diagnosis not present

## 2018-10-24 LAB — LIPID PANEL
Cholesterol: 123 mg/dL (ref 0–200)
HDL: 36.4 mg/dL — ABNORMAL LOW (ref >39.00)
LDL Cholesterol: 67 mg/dL (ref 0–99)
NonHDL: 86.19
Total CHOL/HDL Ratio: 3
Triglycerides: 97 mg/dL (ref 0.0–149.0)
VLDL: 19.4 mg/dL (ref 0.0–40.0)

## 2018-10-24 LAB — COMPREHENSIVE METABOLIC PANEL
ALT: 23 U/L (ref 0–53)
AST: 18 U/L (ref 0–37)
Albumin: 4.4 g/dL (ref 3.5–5.2)
Alkaline Phosphatase: 64 U/L (ref 39–117)
BUN: 15 mg/dL (ref 6–23)
CO2: 31 mEq/L (ref 19–32)
Calcium: 9.9 mg/dL (ref 8.4–10.5)
Chloride: 99 mEq/L (ref 96–112)
Creatinine, Ser: 0.79 mg/dL (ref 0.40–1.50)
GFR: 95.52 mL/min (ref >60.00)
Glucose, Bld: 93 mg/dL (ref 70–99)
Potassium: 4.6 mEq/L (ref 3.5–5.1)
Sodium: 136 mEq/L (ref 135–145)
Total Bilirubin: 0.8 mg/dL (ref 0.2–1.2)
Total Protein: 7.1 g/dL (ref 6.0–8.3)

## 2018-10-24 LAB — CBC
HCT: 47.1 % (ref 39.0–52.0)
Hemoglobin: 16.2 g/dL (ref 13.0–17.0)
MCHC: 34.3 g/dL (ref 30.0–36.0)
MCV: 89.4 fl (ref 78.0–100.0)
Platelets: 248 10*3/uL (ref 150.0–400.0)
RBC: 5.27 Mil/uL (ref 4.22–5.81)
RDW: 13.4 % (ref 11.5–15.5)
WBC: 6.9 10*3/uL (ref 4.0–10.5)

## 2018-10-24 LAB — TSH: TSH: 0.54 u[IU]/mL (ref 0.35–4.50)

## 2018-10-24 MED ORDER — TRIAMCINOLONE ACETONIDE 0.1 % EX CREA: 1.0000 "application " | TOPICAL_CREAM | Freq: Two times a day (BID) | CUTANEOUS | 0 refills | Status: DC

## 2018-10-24 NOTE — Patient Instructions (Addendum)
Our team is going to call to get confirmation of flu shot- thanks for doing this!   Please check with your pharmacy to see if they have the shingrix vaccine. If they do- please get this immunization and update [place] by phone call or mychart with dates you receive the vaccine  Try triamcinolone cream for the arm-try to avoid scratching if possible as that will help with healing  Sign release of information at the check out desk for last colonoscopy report from Dr. Elnoria Howard  Here in [place]  We will call you within two weeks about your referral for thyroid ultrasound. If you do not hear within 3 weeks, give [place] a call.    Please stop by lab before you go

## 2018-10-24 NOTE — Progress Notes (Signed)
Phone: [phone]  Subjective:  Patient presents today for their annual physical. Chief complaint-noted.   See problem oriented charting- ROS- full  review of systems was completed and negative except for: Cough,  abdominal pain and diarrhea last week, urinary frequency and urgency, back pain, seasonal allergies  The following were reviewed and entered/updated in epic: Past Medical History:  Diagnosis Date  . Coronary artery disease    status post PTCA and stenting of his left circumflex artery and right coronary artery  . Hyperlipidemia   . Reflux    omeprazole 40mg    Patient Active Problem List   Diagnosis Date Noted  . CAD (coronary artery disease) 06/21/2011    Priority: High  . BPH (benign prostatic hyperplasia) 09/06/2016    Priority: Medium  . Essential hypertension 08/14/2016    Priority: Medium  . Hyperlipidemia 06/21/2011    Priority: Medium  . Anxiety state 09/06/2016    Priority: Low  . History of adenomatous polyp of colon 08/14/2016    Priority: Low  . Vitamin D deficiency 08/14/2016    Priority: Low  . GERD (gastroesophageal reflux disease)     Priority: Low  . Plantar fasciitis of left foot 06/21/2018   Past Surgical History:  Procedure Laterality Date  . Benign Cyst     on back  . CARDIOVASCULAR STRESS TEST  05-09-2007   EF 64%  . carpal tunnel left    . CYSTECTOMY     removed from finger  . right knee surgery     arthroscopyc 02/2010    Family History  Problem Relation Age of Onset  . Bladder Cancer Mother        passed age 57.   [name] Heart attack Father        31 passed from this  . Alcohol abuse Father     Medications- reviewed and updated Current Outpatient Medications  Medication Sig Dispense Refill  . aspirin 81 MG tablet Take 1 tablet (81 mg total) by mouth daily.    [name] atorvastatin (LIPITOR) 40 MG tablet Take 1 tablet (40 mg total) by mouth daily. 90 tablet 3  . Calcium Carbonate-Vitamin D (CALCIUM + D PO) Take 1 tablet by mouth  daily.     [name] glucosamine-chondroitin 500-400 MG tablet Take 1 tablet by mouth 2 (two) times daily.    . Melatonin 10 MG TABS Take 1 tablet by mouth daily.     . metoprolol tartrate (LOPRESSOR) 25 MG tablet Take 1 tablet (25 mg total) by mouth 2 (two) times daily. 180 tablet 3  . Multiple Vitamin (MULTIVITAMIN PO) Take 1 tablet by mouth daily.     . Omega-3 Fatty Acids (FISH OIL) 1200 MG CAPS Take 1,000 mg by mouth daily.     [name] omeprazole (PRILOSEC) 40 MG capsule Take 1 capsule (40 mg total) by mouth daily. 90 capsule 0  . tamsulosin (FLOMAX) 0.4 MG CAPS capsule Take 1 capsule (0.4 mg total) by mouth daily. 90 capsule 0  . triamcinolone cream (KENALOG) 0.1 % Apply 1 application topically 2 (two) times daily. For 7-10 days maximum 80 g 0   No current facility-administered medications for this visit.     Allergies-reviewed and updated Allergies  Allergen Reactions  . Simvastatin Other (See Comments)    Leg pain     Social History   Social History Narrative   Married. Wife will be patient of Dr. [place]. 3 grown children, 3 grown stepkids. 9 grandkids.       Retired  from [employer] service. Over 46 years.     Objective: BP 118/70 (BP Location: Left Arm, Patient Position: Sitting, Cuff Size: Normal)   Pulse 69   Temp (!) 97.4 F (36.3 C) (Oral)   Ht 5\' 8"  (1.727 m)   Wt 186 lb 9.6 oz (84.6 kg)   SpO2 93%   BMI 28.37 kg/m  Gen: NAD, resting comfortably HEENT: Mucous membranes are moist. Oropharynx normal Neck: right sided thyromegaly with nodule noted CV: RRR no murmurs rubs or gallops Lungs: CTAB no crackles, wheeze, rhonchi Abdomen: soft/nontender/nondistended/normal bowel sounds. No rebound or guarding.  Ext: no edema Skin: warm, dry Neuro: grossly normal, moves all extremities, PERRLA  Assessment/Plan:  78 y.o. male presenting for annual physical.  Health Maintenance counseling: 1. Anticipatory guidance: Patient counseled regarding regular dental exams -q6 months, eye  exams -yearly,  avoiding smoking and second hand smoke , limiting alcohol to 2 beverages per day - doesn't drink.   2. Risk factor reduction:  Advised patient of need for regular exercise and diet rich and fruits and vegetables to reduce risk of heart attack and stroke. Exercise- trying to exercise Monday through saturday. Diet-weight down 7 lbs from last June- trying to eat a balanced healthy diet.  Wt Readings from Last 3 Encounters:  10/24/18 186 lb 9.6 oz (84.6 kg)  03/17/18 193 lb 6.4 oz (87.7 kg)  03/02/18 196 lb 2 oz (89 kg)  3. Immunizations/screenings/ancillary studies-flu shot- had in October- will try to get this from [facility].  Discussed Shingrix at the pharmacy.   Immunization History  Administered Date(s) Administered  . Influenza, High Dose Seasonal PF 08/28/2017  . Influenza-Unspecified 07/28/2016  . Pneumococcal Conjugate-13 08/16/2014  . Pneumococcal Polysaccharide-23 07/08/2010  . Tdap 07/08/2010  4. Prostate cancer screening- past age based screening for prostate cancer.  Still follows with urology-stated urines and PSAs as needed. Sees Dr. Liliane Shi- after Dr. Patsi Sears retired.   5. Colon cancer screening - had colonoscopy in 2016 with 3-year follow-up recommended- apparently he had repeat ina year in 2017- we will try to get records.  - last 2011 Dr. Kinnie Scales 6. Skin cancer screening-no dermatologist.. advised regular sunscreen use. Denies worrisome, changing, or new skin lesions.  7.  Never smoker  Status of chronic or acute concerns   Normal TSH last year-checked due to mild enlargement of right thyroid and hyperlipidemia-we will update again today. Nodule noted today- will get [place]  Healed up from plantar fascitis, right elbow pain, rupture biceps tendon (no surgery for these)- saw orthopedics at that time. Improved after 3-4 weeks. Back to bowling- wearing an arm sleeve.   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 Sounds like contact dermatitis- will trial triamcinolone  Hypertension-controlled on metoprolol 25 mg twice a day.  He denies this diagnosis-once again was listed at outside office  GERD-remains on Prilosec 40 mg.  History of esophageal dilation.  He reports GI is actually wanted him on the 40 instead of the 20mg .  We will continue current dose and update B12. Can refill as needed  BPH-remains on Flomax.  Follows with urology Dr. winter  CAD-stable on aspirin, statin, metoprolol.  Sees Dr. Elease Hashimoto regularly.  He is asymptomatic.  BMI monitoring- elevated BMI noted: Body mass index is 28.37 kg/m. Encouraged need for healthy eating, regular exercise, weight loss.  He is already doing great in this regards  Patient does have some symptoms of claudication-he discussed  these with Dr. Francisca DecemberNahser-he declined ABI at that time.  He is considering getting this done.  He is trying to increase his exercise  Return in about 6 months (around 04/24/2019) for follow up- or sooner if needed.  Lab/Order associations: 1 cup of coffee with splenda and some cream  Preventative health care - Plan: CBC, Lipid panel, Comprehensive metabolic panel, TSH, Vitamin B12  Hyperlipidemia, unspecified hyperlipidemia type - Plan: CBC, Lipid panel, Comprehensive metabolic panel, TSH  High risk medication use - Plan: Vitamin B12  Thyroid nodule - Plan: US THYROID  Meds ordered this encounter  Medications  . triamcinolone cream (KENALOG) 0.1 %    Sig: Apply 1 application topically 2 (two) times daily. For 7-10 days maximum    Dispense:  80 g    Refill:  0   Return precautions advised.  Tana ConchStephen Samul Mcinroy, MD

## 2018-10-26 LAB — VITAMIN B12: Vitamin B-12: 486 pg/mL (ref 211–911)

## 2018-10-31 ENCOUNTER — Ambulatory Visit
Admission: RE | Admit: 2018-10-31 | Discharge: 2018-10-31 | Disposition: A | Discharge status: Home / Self Care | Payer: Medicare HMO | Source: Ambulatory Visit | Attending: Family Medicine | Admitting: Family Medicine

## 2018-10-31 DIAGNOSIS — E010 Iodine-deficiency related diffuse (endemic) goiter: Secondary | ICD-10-CM | POA: Diagnosis not present

## 2018-10-31 DIAGNOSIS — E041 Nontoxic single thyroid nodule: Secondary | ICD-10-CM

## 2018-11-02 DIAGNOSIS — R351 Nocturia: Secondary | ICD-10-CM | POA: Diagnosis not present

## 2018-11-02 DIAGNOSIS — N401 Enlarged prostate with lower urinary tract symptoms: Secondary | ICD-10-CM | POA: Diagnosis not present

## 2018-11-03 ENCOUNTER — Encounter: Payer: Self-pay | Admitting: Family Medicine

## 2018-11-20 ENCOUNTER — Other Ambulatory Visit: Payer: Self-pay | Admitting: Family Medicine

## 2018-12-14 ENCOUNTER — Other Ambulatory Visit: Payer: Self-pay | Admitting: Family Medicine

## 2019-03-14 DIAGNOSIS — M48062 Spinal stenosis, lumbar region with neurogenic claudication: Secondary | ICD-10-CM | POA: Diagnosis not present

## 2019-03-14 DIAGNOSIS — M5416 Radiculopathy, lumbar region: Secondary | ICD-10-CM | POA: Diagnosis not present

## 2019-03-14 DIAGNOSIS — I10 Essential (primary) hypertension: Secondary | ICD-10-CM | POA: Diagnosis not present

## 2019-03-14 DIAGNOSIS — Z6828 Body mass index (BMI) 28.0-28.9, adult: Secondary | ICD-10-CM | POA: Diagnosis not present

## 2019-03-26 DIAGNOSIS — M25561 Pain in right knee: Secondary | ICD-10-CM | POA: Diagnosis not present

## 2019-04-11 ENCOUNTER — Ambulatory Visit (INDEPENDENT_AMBULATORY_CARE_PROVIDER_SITE_OTHER): Payer: Medicare HMO | Admitting: Family Medicine

## 2019-04-11 ENCOUNTER — Encounter: Payer: Self-pay | Admitting: Family Medicine

## 2019-04-11 ENCOUNTER — Other Ambulatory Visit: Payer: Self-pay

## 2019-04-11 VITALS — BP 120/80 | HR 79 | Temp 98.4°F | Ht 68.0 in | Wt 189.1 lb

## 2019-04-11 DIAGNOSIS — G4700 Insomnia, unspecified: Secondary | ICD-10-CM | POA: Diagnosis not present

## 2019-04-11 DIAGNOSIS — E663 Overweight: Secondary | ICD-10-CM

## 2019-04-11 DIAGNOSIS — I2510 Atherosclerotic heart disease of native coronary artery without angina pectoris: Secondary | ICD-10-CM | POA: Diagnosis not present

## 2019-04-11 DIAGNOSIS — I10 Essential (primary) hypertension: Secondary | ICD-10-CM | POA: Diagnosis not present

## 2019-04-11 DIAGNOSIS — E785 Hyperlipidemia, unspecified: Secondary | ICD-10-CM | POA: Diagnosis not present

## 2019-04-11 DIAGNOSIS — N401 Enlarged prostate with lower urinary tract symptoms: Secondary | ICD-10-CM

## 2019-04-11 DIAGNOSIS — K219 Gastro-esophageal reflux disease without esophagitis: Secondary | ICD-10-CM

## 2019-04-11 MED ORDER — TRAZODONE HCL 50 MG PO TABS: 25.0000 mg | ORAL_TABLET | Freq: Every evening | ORAL | 0 refills | Status: DC | PRN

## 2019-04-11 MED ORDER — OMEPRAZOLE 40 MG PO CPDR: 40.0000 mg | DELAYED_RELEASE_CAPSULE | Freq: Every day | ORAL | 3 refills | Status: DC

## 2019-04-11 MED ORDER — OMEPRAZOLE 40 MG PO CPDR: 40.0000 mg | DELAYED_RELEASE_CAPSULE | Freq: Every day | ORAL | 0 refills | Status: DC

## 2019-04-11 MED ORDER — TAMSULOSIN HCL 0.4 MG PO CAPS: 0.4000 mg | ORAL_CAPSULE | Freq: Every day | ORAL | 0 refills | Status: DC

## 2019-04-11 MED ORDER — TAMSULOSIN HCL 0.4 MG PO CAPS: 0.4000 mg | ORAL_CAPSULE | Freq: Every day | ORAL | 3 refills | Status: DC

## 2019-04-11 NOTE — Assessment & Plan Note (Signed)
S:sleep issues for several years. Took melatonin in the past and didn't find it that helpful in the past.  In bed 7 hours usually but tosses and turns- sometimes has hard time falling asleep A/P: he would like to trial trazodone after our discussion today- advised start with half tablet and can use full tablet if needed.   - advised no screen time 1 hour before bed

## 2019-04-11 NOTE — Patient Instructions (Addendum)
Lets trial trazodone half tablet to start - can use full tablet if not effective  Avoid screens an hour before bed (30 minutes at minimum)

## 2019-04-11 NOTE — Progress Notes (Signed)
Phone [phone]   Subjective:  [name] [name] is a 78 y.o. year old very pleasant male patient who presents for/with See problem oriented charting Chief Complaint  Patient presents with  . Hypertension    6 month f/u    ROS- No chest pain. Some  shortness of breath with exertion- not worsening. No headache or blurry vision.    Past Medical History-  Patient Active Problem List   Diagnosis Date Noted  . CAD (coronary artery disease) 06/21/2011    Priority: High  . Insomnia 04/11/2019    Priority: Medium  . Claudication of lower extremity (HCC) 10/24/2018    Priority: Medium  . BPH (benign prostatic hyperplasia) 09/06/2016    Priority: Medium  . Essential hypertension 08/14/2016    Priority: Medium  . Hyperlipidemia 06/21/2011    Priority: Medium  . Anxiety state 09/06/2016    Priority: Low  . History of adenomatous polyp of colon 08/14/2016    Priority: Low  . Vitamin D deficiency 08/14/2016    Priority: Low  . GERD (gastroesophageal reflux disease)     Priority: Low  . Plantar fasciitis of left foot 06/21/2018    Medications- reviewed and updated Current Outpatient Medications  Medication Sig Dispense Refill  . aspirin 81 MG tablet Take 1 tablet (81 mg total) by mouth daily.    [name]. atorvastatin (LIPITOR) 40 MG tablet Take 1 tablet (40 mg total) by mouth daily. 90 tablet 3  . Calcium Carbonate-Vitamin D (CALCIUM + D PO) Take 1 tablet by mouth daily.     [name]. glucosamine-chondroitin 500-400 MG tablet Take 1 tablet by mouth 2 (two) times daily.    . Melatonin 10 MG TABS Take 1 tablet by mouth daily.     . metoprolol tartrate (LOPRESSOR) 25 MG tablet Take 1 tablet (25 mg total) by mouth 2 (two) times daily. 180 tablet 3  . Multiple Vitamin (MULTIVITAMIN PO) Take 1 tablet by mouth daily.     . Omega-3 Fatty Acids (FISH OIL) 1200 MG CAPS Take 1,000 mg by mouth daily.     [name]. omeprazole (PRILOSEC) 40 MG capsule Take 1 capsule (40 mg total) by mouth daily. 90 capsule 0  .  tamsulosin (FLOMAX) 0.4 MG CAPS capsule Take 1 capsule (0.4 mg total) by mouth daily. 90 capsule 0  . traZODone (DESYREL) 50 MG tablet Take 0.5-1 tablets (25-50 mg total) by mouth at bedtime as needed for sleep. 90 tablet 0   No current facility-administered medications for this visit.      Objective:  BP 120/80   Pulse 79   Temp 98.4 F (36.9 C) (Oral)   Ht 5\' 8"  (1.727 m)   Wt 189 lb 1.6 oz (85.8 kg)   SpO2 99%   BMI 28.75 kg/m  Gen: NAD, resting comfortably CV: RRR no murmurs rubs or gallops Lungs: CTAB no crackles, wheeze, rhonchi Abdomen: soft/nontender/nondistended/normal bowel sounds. Ext: no edema Skin: warm, dry Neuro: grossly normal, moves all extremities    Assessment and Plan   #social update- really hoping to get back to the gym and bowling- he enjoys these  #hyperlipidemia/CAD S:  controlled on Atorvastatin 40 mg daily, Fish Oil.   Compliant with aspirin- no chest pain. Some shortness of breath with exertion- admits to deconditioning- going to see Dr. Elease HashimotoNahser later this year. Compliant with metoprolol as below Lab Results  Component Value Date   CHOL 123 10/24/2018   HDL 36.40 (L) 10/24/2018   LDLCALC 67 10/24/2018   LDLDIRECT  74.0 09/07/2017   TRIG 97.0 10/24/2018   CHOLHDL 3 10/24/2018   A/P: Stable x 2. Continue current medications.   #hypertension S: controlled on Metoprolol 25 mg BID.  BP Readings from Last 3 Encounters:  04/11/19 120/80  10/24/18 118/70  03/17/18 138/80  A/P: Stable. Continue current medications.   # Thyroid Nodule S:ultrasound in January "1. Thyromegaly with nodular markedly heterogenous parenchyma, no discrete or dominant nodule or mass. "  A/P: TSh has been ok- continue to monitor yearly   # BPH S:Taking Tamsulosin 0.4 mg daily.  Saw urology in January- things are stable A/P: Stable. Continue current medications.   # GERD S:Taking Omeprazole 40 mg daily. History of esophageal dilation and apparently GI wants him on  higher dose for prevention of recurrence.  Lab Results  Component Value Date   HQPRFFMB84 665 10/24/2018  A/P: Stable. Continue current medications.    # Insomnia S:sleep issues for several years. Took melatonin in the past and didn't find it that helpful in the past.  In bed 7 hours usually but tosses and turns- sometimes has hard time falling asleep A/P: he would like to trial trazodone after our discussion today- advised start with half tablet and can use full tablet if needed.   - advised no screen time 1 hour before bed  # overweight S:weight up 3 lbs - feels like no [employer] and no bowling are the key issues. Diet is pretty stable  Wt Readings from Last 3 Encounters:  04/11/19 189 lb 1.6 oz (85.8 kg)  10/24/18 186 lb 9.6 oz (84.6 kg)  03/17/18 193 lb 6.4 oz (87.7 kg)  A/P: recent mild increase- Encouraged need for healthy eating, regular exercise, weight loss.    6 month cpe already planned  Future Appointments  Date Time Provider [place]  05/07/2019  2:45 PM Liliane Shi, [place] [facility]  10/30/2019 10:40 AM Yong Channel Brayton Mars, MD [facility]   Return in about 6 months (around 10/12/2019) for physical.  Lab/Order associations:   ICD-10-CM   1. Hyperlipidemia, unspecified hyperlipidemia type  E78.5   2. Essential hypertension  I10   3. Coronary artery disease involving native coronary artery of native heart without angina pectoris  I25.10   4. Gastroesophageal reflux disease without esophagitis  K21.9   5. Benign prostatic hyperplasia with lower urinary tract symptoms, symptom details unspecified  N40.1   6. Insomnia, unspecified type  G47.00   7. Overweight  E66.3     Meds ordered this encounter  Medications  . tamsulosin (FLOMAX) 0.4 MG CAPS capsule    Sig: Take 1 capsule (0.4 mg total) by mouth daily.    Dispense:  90 capsule    Refill:  0  . omeprazole (PRILOSEC) 40 MG capsule    Sig: Take 1 capsule (40 mg total) by mouth daily.    Dispense:   90 capsule    Refill:  0  . traZODone (DESYREL) 50 MG tablet    Sig: Take 0.5-1 tablets (25-50 mg total) by mouth at bedtime as needed for sleep.    Dispense:  90 tablet    Refill:  0   Return precautions advised.  Garret Reddish, MD

## 2019-04-24 ENCOUNTER — Ambulatory Visit: Payer: Medicare HMO | Admitting: Family Medicine

## 2019-05-04 ENCOUNTER — Telehealth: Payer: Self-pay

## 2019-05-04 NOTE — Telephone Encounter (Signed)

## 2019-05-07 ENCOUNTER — Other Ambulatory Visit: Payer: Self-pay

## 2019-05-07 ENCOUNTER — Ambulatory Visit (INDEPENDENT_AMBULATORY_CARE_PROVIDER_SITE_OTHER): Payer: Medicare HMO | Admitting: Physician Assistant

## 2019-05-07 ENCOUNTER — Encounter: Payer: Self-pay | Admitting: Physician Assistant

## 2019-05-07 VITALS — BP 120/76 | HR 87 | Ht 68.0 in | Wt 189.0 lb

## 2019-05-07 DIAGNOSIS — I10 Essential (primary) hypertension: Secondary | ICD-10-CM | POA: Diagnosis not present

## 2019-05-07 DIAGNOSIS — I2510 Atherosclerotic heart disease of native coronary artery without angina pectoris: Secondary | ICD-10-CM

## 2019-05-07 MED ORDER — ATORVASTATIN CALCIUM 40 MG PO TABS: 40.0000 mg | ORAL_TABLET | Freq: Every day | ORAL | 3 refills | Status: DC

## 2019-05-07 MED ORDER — METOPROLOL TARTRATE 25 MG PO TABS: 25.0000 mg | ORAL_TABLET | Freq: Two times a day (BID) | ORAL | 3 refills | Status: DC

## 2019-05-07 NOTE — Progress Notes (Signed)
Cardiology Office Note:    Date:  05/07/2019   ID:  [name] [name], DOB [birth year], MRN [id number]  PCP:  Shelva MajesticHunter, [name] O, MD  Cardiologist:  [name] MissPhilip Nahser, MD   Electrophysiologist:  None   Referring MD: Shelva MajesticHunter, [name] O, MD   Chief Complaint  Patient presents with  . Follow-up    CAD     History of Present Illness:    [name] [name] is a 78 y.o. male with :  Coronary artery disease  S/p BMS to LCx, POBA to OM1 09/2000  S/p staged POBA to PDA 10/2000  Myoview 05/2007: No ischemia  Hyperlipidemia  Mr. [name] [name] last seen by Dr. Elease HashimotoNahser in June 2019.  He complained of leg pain at that time.  Lower extremity arterial Dopplers were ordered but never performed.  Since last seen, he has done well.  He started exercising and his leg pain improved.  He has not had chest pain, significant shortness of breath, syncope, paroxysmal nocturnal dyspnea, leg swelling.   Prior CV studies:   The following studies were reviewed today:  Myoview 05/2007 EF 64, no ischemia, normal study  Cardiac catheterization 09/2000 LM distal 20-30 LAD mid to distal 70-80, far distal sequential 90; D1 and D2 70-80 (small branches) LCx proximal 100 RCA proximal 30; PDA 90 EF 65 PCI:  BMS to LCx; POBA to the OM1  PCI 10/2000 PCI:  POBA to PDA  Past Medical History:  Diagnosis Date  . Coronary artery disease    status post PTCA and stenting of his left circumflex artery and right coronary artery  . Hyperlipidemia   . Reflux    omeprazole 40mg    Surgical Hx: The patient  has a past surgical history that includes Cystectomy; Benign Cyst; Cardiovascular stress test (05-09-2007); right knee surgery; and carpal tunnel left.   Current Medications: Current Meds  Medication Sig  . aspirin 81 MG tablet Take 1 tablet (81 mg total) by mouth daily.  [name]. atorvastatin (LIPITOR) 40 MG tablet Take 1 tablet (40 mg total) by mouth daily.  . Calcium Carbonate-Vitamin D (CALCIUM + D PO) Take 1 tablet by  mouth daily.   [name]. glucosamine-chondroitin 500-400 MG tablet Take 1 tablet by mouth 2 (two) times daily.  . Melatonin 10 MG TABS Take 1 tablet by mouth daily.   . metoprolol tartrate (LOPRESSOR) 25 MG tablet Take 1 tablet (25 mg total) by mouth 2 (two) times daily.  . Multiple Vitamin (MULTIVITAMIN PO) Take 1 tablet by mouth daily.   . Omega-3 Fatty Acids (FISH OIL) 1200 MG CAPS Take 1,000 mg by mouth daily.   [name]. omeprazole (PRILOSEC) 40 MG capsule Take 1 capsule (40 mg total) by mouth daily.  . tamsulosin (FLOMAX) 0.4 MG CAPS capsule Take 1 capsule (0.4 mg total) by mouth daily.  . traZODone (DESYREL) 50 MG tablet Take 0.5-1 tablets (25-50 mg total) by mouth at bedtime as needed for sleep.  . [DISCONTINUED] atorvastatin (LIPITOR) 40 MG tablet Take 1 tablet (40 mg total) by mouth daily.  . [DISCONTINUED] metoprolol tartrate (LOPRESSOR) 25 MG tablet Take 1 tablet (25 mg total) by mouth 2 (two) times daily.     Allergies:   Simvastatin   Social History   Tobacco Use  . Smoking status: Never Smoker  . Smokeless tobacco: Never Used  Substance Use Topics  . Alcohol use: No  . Drug use: No     Family Hx: The patient's family history includes Alcohol abuse in his father;  Bladder Cancer in his mother; Heart attack in his father.  ROS:   Please see the history of present illness.    ROS All other systems reviewed and are negative.   EKGs/Labs/Other Test Reviewed:    EKG:  EKG is  ordered today.  The ekg ordered today demonstrates normal sinus rhythm, HR 87, Normal axis, QTc 457, no changes  Recent Labs: 10/24/2018: ALT 23; BUN 15; Creatinine, Ser 0.79; Hemoglobin 16.2; Platelets 248.0; Potassium 4.6; Sodium 136; TSH 0.54   Recent Lipid Panel Lab Results  Component Value Date/Time   CHOL 123 10/24/2018 11:34 AM   CHOL 126 03/17/2018 09:22 AM   TRIG 97.0 10/24/2018 11:34 AM   HDL 36.40 (L) 10/24/2018 11:34 AM   HDL 39 (L) 03/17/2018 09:22 AM   CHOLHDL 3 10/24/2018 11:34 AM   LDLCALC  67 10/24/2018 11:34 AM   LDLCALC 76 03/17/2018 09:22 AM   LDLDIRECT 74.0 09/07/2017 09:04 AM    Physical Exam:    VS:  BP 120/76   Pulse 87   Ht 5\' 8"  (1.727 m)   Wt 189 lb (85.7 kg)   BMI 28.74 kg/m     Wt Readings from Last 3 Encounters:  05/07/19 189 lb (85.7 kg)  04/11/19 189 lb 1.6 oz (85.8 kg)  10/24/18 186 lb 9.6 oz (84.6 kg)     Physical Exam  Constitutional: He is oriented to person, place, and time. He appears well-developed and well-nourished. No distress.  HENT:  Head: Normocephalic and atraumatic.  Neck: Neck supple. No JVD present.  Cardiovascular: Normal rate, regular rhythm, S1 normal and S2 normal.  No murmur heard. Pulmonary/Chest: Breath sounds normal. He has no rales.  Abdominal: Soft. There is no hepatomegaly.  Musculoskeletal:        General: No edema.  Neurological: He is alert and oriented to person, place, and time.  Skin: Skin is warm and dry.    ASSESSMENT & PLAN:    1. Coronary artery disease involving native coronary artery of native heart without angina pectoris Hx of BMS to LCx, POBA to OM1 and POBA to PDA in late 2001.  Myoview in 2008 low risk.  He is not having angina.  Recent lipids optimal.  Continue ASA, statin.   2. Essential hypertension The patient's blood pressure is controlled on his current regimen.  Continue current therapy.     Dispo:  Return in about 1 year (around 05/06/2020) for Routine Follow Up, w/ Dr. Acie Fredrickson, or Richardson Dopp, PA-C.   Medication Adjustments/Labs and Tests Ordered: Current medicines are reviewed at length with the patient today.  Concerns regarding medicines are outlined above.  Tests Ordered: Orders Placed This Encounter  Procedures  . EKG 12-Lead   Medication Changes: Meds ordered this encounter  Medications  . atorvastatin (LIPITOR) 40 MG tablet    Sig: Take 1 tablet (40 mg total) by mouth daily.    Dispense:  90 tablet    Refill:  3  . metoprolol tartrate (LOPRESSOR) 25 MG tablet    Sig:  Take 1 tablet (25 mg total) by mouth 2 (two) times daily.    Dispense:  180 tablet    Refill:  3    Signed, Richardson Dopp, PA-C  05/07/2019 5:28 PM    [facility] [address], [place], [place]  [address] Phone: [phone]; Fax: [phone]

## 2019-05-07 NOTE — Patient Instructions (Signed)
Medication Instructions:  No changes  If you need a refill on your cardiac medications before your next appointment, please call your pharmacy.   Lab work: None  If you have labs (blood work) drawn today and your tests are completely normal, you will receive your results only by: . MyChart Message (if you have MyChart) OR . A paper copy in the mail If you have any lab test that is abnormal or we need to change your treatment, we will call you to review the results.  Testing/Procedures: None  Follow-Up: At [facility], you and your health needs are our priority.  As part of our continuing mission to provide you with exceptional heart care, we have created designated Provider Care Teams.  These Care Teams include your primary Cardiologist (physician) and Advanced Practice Providers (APPs -  Physician Assistants and Nurse Practitioners) who all work together to provide you with the care you need, when you need it. You will need a follow up appointment in:  12 months.  Please call our office 2 months in advance to schedule this appointment.  You may see Philip Nahser, MD or Cranford Blessinger, PA-C   Any Other Special Instructions Will Be Listed Below (If Applicable). 

## 2019-05-14 ENCOUNTER — Ambulatory Visit (INDEPENDENT_AMBULATORY_CARE_PROVIDER_SITE_OTHER): Payer: Medicare HMO

## 2019-05-14 ENCOUNTER — Other Ambulatory Visit: Payer: Self-pay

## 2019-05-14 VITALS — BP 130/72 | Temp 97.2°F | Resp 18 | Ht 68.0 in | Wt 190.4 lb

## 2019-05-14 DIAGNOSIS — Z0000 Encounter for general adult medical examination without abnormal findings: Secondary | ICD-10-CM

## 2019-05-14 NOTE — Progress Notes (Signed)
Subjective:   [name] [name] is a 78 y.o. male who presents for Medicare Annual/Subsequent preventive examination.  Review of Systems:   Cardiac Risk Factors include: advanced age (>7655men, 4>65 women);dyslipidemia;hypertension     Objective:    Vitals: BP 130/72   Temp (!) 97.2 F (36.2 C)   Resp 18   Ht 5\' 8"  (1.727 m)   Wt 190 lb 6.4 oz (86.4 kg)   BMI 28.95 kg/m   Body mass index is 28.95 kg/m.  Advanced Directives 05/14/2019 03/02/2018  Does Patient Have a Medical Advance Directive? No No  Would patient like information on creating a medical advance directive? Yes (MAU/Ambulatory/Procedural Areas - Information given) -    Tobacco Social History   Tobacco Use  Smoking Status Never Smoker  Smokeless Tobacco Never Used     Counseling given: Not Answered   Clinical Intake:  Pre-visit preparation completed: Yes  Pain : No/denies pain  Diabetes: No  How often do you need to have someone help you when you read instructions, pamphlets, or other written materials from your doctor or pharmacy?: 1 - Never  Interpreter Needed?: No  Information entered by :: Kandis Fantasiaourtney Niyanna Asch LPN  Past Medical History:  Diagnosis Date  . Coronary artery disease    status post PTCA and stenting of his left circumflex artery and right coronary artery  . Hyperlipidemia   . Reflux    omeprazole 40mg    Past Surgical History:  Procedure Laterality Date  . Benign Cyst     on back  . CARDIOVASCULAR STRESS TEST  05-09-2007   EF 64%  . carpal tunnel left    . CYSTECTOMY     removed from finger  . right knee surgery     arthroscopyc 02/2010   Family History  Problem Relation Age of Onset  . Bladder Cancer Mother        passed age 76.   [name]. Heart attack Father        [id number] passed from this  . Alcohol abuse Father    Social History   Socioeconomic History  . Marital status: Married    Spouse name: Not on file  . Number of children: Not on file  . Years of education: Not on  file  . Highest education level: Not on file  Occupational History  . Not on file  Social Needs  . Financial resource strain: Not on file  . Food insecurity    Worry: Not on file    Inability: Not on file  . Transportation needs    Medical: Not on file    Non-medical: Not on file  Tobacco Use  . Smoking status: Never Smoker  . Smokeless tobacco: Never Used  Substance and Sexual Activity  . Alcohol use: No  . Drug use: No  . Sexual activity: Not on file  Lifestyle  . Physical activity    Days per week: Not on file    Minutes per session: Not on file  . Stress: Not on file  Relationships  . Social Musicianconnections    Talks on phone: Not on file    Gets together: Not on file    Attends religious service: Not on file    Active member of club or organization: Not on file    Attends meetings of clubs or organizations: Not on file    Relationship status: Not on file  Other Topics Concern  . Not on file  Social History Narrative   Married.  Wife will be patient of Dr. Sw[place]Jordan. 3 grown children, 3 grown stepkids. 9 grandkids.       Retired from [employer]. Over 46 years.     Outpatient Encounter Medications as of 05/14/2019  Medication Sig  . aspirin 81 MG tablet Take 1 tablet (81 mg total) by mouth daily.  [name]. atorvastatin (LIPITOR) 40 MG tablet Take 1 tablet (40 mg total) by mouth daily.  . Calcium Carbonate-Vitamin D (CALCIUM + D PO) Take 1 tablet by mouth daily.   [name]. glucosamine-chondroitin 500-400 MG tablet Take 1 tablet by mouth 2 (two) times daily.  . Melatonin 10 MG TABS Take 1 tablet by mouth daily.   . metoprolol tartrate (LOPRESSOR) 25 MG tablet Take 1 tablet (25 mg total) by mouth 2 (two) times daily.  . Multiple Vitamin (MULTIVITAMIN PO) Take 1 tablet by mouth daily.   . Omega-3 Fatty Acids (FISH OIL) 1200 MG CAPS Take 1,000 mg by mouth daily.   [name]. omeprazole (PRILOSEC) 40 MG capsule Take 1 capsule (40 mg total) by mouth daily.  . tamsulosin (FLOMAX) 0.4 MG CAPS capsule  Take 1 capsule (0.4 mg total) by mouth daily.  . traZODone (DESYREL) 50 MG tablet Take 0.5-1 tablets (25-50 mg total) by mouth at bedtime as needed for sleep.   No facility-administered encounter medications on file as of 05/14/2019.     Activities of Daily Living In your present state of health, do you have any difficulty performing the following activities: 05/14/2019  Hearing? N  Vision? N  Difficulty concentrating or making decisions? N  Walking or climbing stairs? N  Doing errands, shopping? N  Preparing Food and eating ? N  Using the Toilet? N  In the past six months, have you accidently leaked urine? N  Do you have problems with loss of bowel control? N  Managing your Medications? N  Managing your Finances? N  Some recent data might be hidden    Patient Care Team: Shelva MajesticHunter, Stephen O, MD as PCP - General (Family Medicine) Nahser, Deloris PingPhilip J, MD as PCP - Cardiology (Cardiology)   Assessment:   This is a routine wellness examination for [name] [name].  Exercise Activities and Dietary recommendations Current Exercise Habits: Structured exercise class(patient enjoys bowling and [employer] exercise), Time (Minutes): 30, Frequency (Times/Week): 5, Weekly Exercise (Minutes/Week): 150, Intensity: Mild  Goals    . Exercise 150 min/wk Moderate Activity     Wants to work on getting back the [employer] activities in the near future     . Weight (lb) < 188 lb (85.3 kg)     Stop eating late at hs. Curtail bread, sugar, sweets  Check out  online nutrition programs as WikiBlast.com.cychosemyplate.gov and LimitLaws.com.cymyfitnesspal.com; fit862me; Look for foods with "whole" wheat; bran; oatmeal etc Shot at the farmer's markets in season for fresher choices  Watch for "hydrogenated" on the label of oils which are trans-fats.  Watch for "high fructose corn syrup" in snacks, yogurt or ketchup  Meats have less marbling; bright colored fruits and vegetables;  Canned; dump out liquid and wash vegetables. Be mindful of what we are eating   Portion control is essential to a health weight! Sit down; take a break and enjoy your meal; take smaller bites; put the fork down between bites;  It takes 20 minutes to get full; so check in with your fullness cues and stop eating when you start to fill full              Fall Risk Fall  Risk  03/02/2018 09/07/2017 07/28/2016  Falls in the past year? No No No     Depression Screen PHQ 2/9 Scores 04/11/2019 03/02/2018 09/07/2017 07/28/2016  PHQ - 2 Score 0 0 0 0    Cognitive Function MMSE - Mini Mental State Exam 03/02/2018  Not completed: (No Data)     6CIT Screen 05/14/2019  What Year? 0 points  What month? 0 points  What time? 0 points  Count back from 20 0 points  Months in reverse 0 points  Repeat phrase 0 points  Total Score 0    Immunization History  Administered Date(s) Administered  . Influenza, High Dose Seasonal PF 08/28/2017  . Influenza-Unspecified 07/28/2016  . Pneumococcal Conjugate-13 08/16/2014  . Pneumococcal Polysaccharide-23 07/08/2010  . Tdap 07/08/2010    Qualifies for Shingles Vaccine?  Yes and discussed Shingrix with patient;  He will check coverage at pharmacy and report any administrations at his next visit   Screening Tests Health Maintenance  Topic Date Due  . INFLUENZA VACCINE  05/05/2019  . TETANUS/TDAP  07/08/2020  . COLONOSCOPY  09/07/2020  . PNA vac Low Risk Adult  Completed   Cancer Screenings:  Colorectal Screening: Completed 09/07/10.   Lung Cancer Screening: does not qualify   Additional Screening:   Vision Screening: Recommended annual ophthalmology exams for early detection of glaucoma and other disorders of the eye. Is the patient up to date with their annual eye exam?  Patient plans to call and schedule  Who is the provider or what is the name of the office in which the pt attends annual eye exams? Dr. Marica Otter   Dental Screening: Recommended annual dental exams for proper oral hygiene  Community Resource  Referral:  CRR required this visit? No       Plan:  I have personally reviewed and addressed the Medicare Annual Wellness questionnaire and have noted the following in the patient's chart:  A. Medical and social history B. Use of alcohol, tobacco or illicit drugs  C. Current medications and supplements D. Functional ability and status E.  Nutritional status F.  Physical activity G. Advance directives H. List of other physicians I.  Hospitalizations, surgeries, and ER visits in previous 12 months J.  [place] such as hearing and vision if needed, cognitive and depression L. Referrals and appointments- none   In addition, I have reviewed and discussed with patient certain preventive protocols, quality metrics, and best practice recommendations. A written personalized care plan for preventive services as well as general preventive health recommendations were provided to patient.   Signed,  Denman George, LPN  Nurse Health Advisor   Nurse Notes:  Patient would like to see if there is a statement that can be sent from the office requesting that he resume exercise at the [facility].  I will call and get the specifics from this and follow up with patient

## 2019-05-14 NOTE — Progress Notes (Signed)
I have reviewed and agree with note, evaluation, plan.   I am happy to try to help with letter for the [facility] once we get the specific request.  Garret Reddish, MD

## 2019-05-14 NOTE — Patient Instructions (Addendum)
Mr. [name] [name] , Thank you for taking time to come for your Medicare Wellness Visit. I appreciate your ongoing commitment to your health goals. Please review the following plan we discussed and let me know if I can assist you in the future.   Screening recommendations/referrals: Colorectal Screening: completed 09/07/10  Vision and Dental Exams: Recommended annual ophthalmology exams for early detection of glaucoma and other disorders of the eye Recommended annual dental exams for proper oral hygiene  Vaccinations: Influenza vaccine: recommended this fall  Pneumococcal vaccine: completed  Tdap vaccine: completed 2011 Shingles vaccine: Please call your insurance company to determine your out of pocket expense for the Shingrix vaccine. You may receive this vaccine at your local pharmacy.  Advanced directives: Please bring a copy of your POA (Power of Attorney) and/or Living Will to your next appointment.  Goals:  Recommend to decrease portion sizes by eating 3 small healthy meals and at least 2 healthy snacks per day.   Next appointment: Please schedule your Annual Wellness Visit with your Nurse Health Advisor in one year.  Preventive Care 14 Years and Older, Male Preventive care refers to lifestyle choices and visits with your health care provider that can promote health and wellness. What does preventive care include?  A yearly physical exam. This is also called an annual well check.  Dental exams once or twice a year.  Routine eye exams. Ask your health care provider how often you should have your eyes checked.  Personal lifestyle choices, including:  Daily care of your teeth and gums.  Regular physical activity.  Eating a healthy diet.  Avoiding tobacco and drug use.  Limiting alcohol use.  Practicing safe sex.  Taking low doses of aspirin every day if recommended by your health care provider..  Taking vitamin and mineral supplements as recommended by your health care  provider. What happens during an annual well check? The services and screenings done by your health care provider during your annual well check will depend on your age, overall health, lifestyle risk factors, and family history of disease. Counseling  Your health care provider may ask you questions about your:  Alcohol use.  Tobacco use.  Drug use.  Emotional well-being.  Home and relationship well-being.  Sexual activity.  Eating habits.  History of falls.  Memory and ability to understand (cognition).  Work and work Statistician. Screening  You may have the following tests or measurements:  Height, weight, and BMI.  Blood pressure.  Lipid and cholesterol levels. These may be checked every 5 years, or more frequently if you are over 81 years old.  Skin check.  Lung cancer screening. You may have this screening every year starting at age 70 if you have a 30-pack-year history of smoking and currently smoke or have quit within the past 15 years.  Fecal occult blood test (FOBT) of the stool. You may have this test every year starting at age 65.  Flexible sigmoidoscopy or colonoscopy. You may have a sigmoidoscopy every 5 years or a colonoscopy every 10 years starting at age 56.  Prostate cancer screening. Recommendations will vary depending on your family history and other risks.  Hepatitis C blood test.  Hepatitis B blood test.  Sexually transmitted disease (STD) testing.  Diabetes screening. This is done by checking your blood sugar (glucose) after you have not eaten for a while (fasting). You may have this done every 1-3 years.  Abdominal aortic aneurysm (AAA) screening. You may need this if you are a current  or former smoker.  Osteoporosis. You may be screened starting at age 65 if you are at high risk. Talk with your health care provider about your test results, treatment options, and if necessary, the need for more tests. Vaccines  Your health care provider  may recommend certain vaccines, such as:  Influenza vaccine. This is recommended every year.  Tetanus, diphtheria, and acellular pertussis (Tdap, Td) vaccine. You may need a Td booster every 10 years.  Zoster vaccine. You may need this after age 93.  Pneumococcal 13-valent conjugate (PCV13) vaccine. One dose is recommended after age 32.  Pneumococcal polysaccharide (PPSV23) vaccine. One dose is recommended after age 18. Talk to your health care provider about which screenings and vaccines you need and how often you need them. This information is not intended to replace advice given to you by your health care provider. Make sure you discuss any questions you have with your health care provider. Document Released: 10/17/2015 Document Revised: 06/09/2016 Document Reviewed: 07/22/2015 Elsevier Interactive Patient Education  2017 [place] Prevention in the Home Falls can cause injuries. They can happen to people of all ages. There are many things you can do to make your home safe and to help prevent falls. What can I do on the outside of my home?  Regularly fix the edges of walkways and driveways and fix any cracks.  Remove anything that might make you trip as you walk through a door, such as a raised step or threshold.  Trim any bushes or trees on the path to your home.  Use bright outdoor lighting.  Clear any walking paths of anything that might make someone trip, such as rocks or tools.  Regularly check to see if handrails are loose or broken. Make sure that both sides of any steps have handrails.  Any raised decks and porches should have guardrails on the edges.  Have any leaves, snow, or ice cleared regularly.  Use sand or salt on walking paths during winter.  Clean up any spills in your garage right away. This includes oil or grease spills. What can I do in the bathroom?  Use night lights.  Install grab bars by the toilet and in the tub and shower. Do not use  towel bars as grab bars.  Use non-skid mats or decals in the tub or shower.  If you need to sit down in the shower, use a plastic, non-slip stool.  Keep the floor dry. Clean up any water that spills on the floor as soon as it happens.  Remove soap buildup in the tub or shower regularly.  Attach bath mats securely with double-sided non-slip rug tape.  Do not have throw rugs and other things on the floor that can make you trip. What can I do in the bedroom?  Use night lights.  Make sure that you have a light by your bed that is easy to reach.  Do not use any sheets or blankets that are too big for your bed. They should not hang down onto the floor.  Have a firm chair that has side arms. You can use this for support while you get dressed.  Do not have throw rugs and other things on the floor that can make you trip. What can I do in the kitchen?  Clean up any spills right away.  Avoid walking on wet floors.  Keep items that you use a lot in easy-to-reach places.  If you need to reach something above you,  use a strong step stool that has a grab bar.  Keep electrical cords out of the way.  Do not use floor polish or wax that makes floors slippery. If you must use wax, use non-skid floor wax.  Do not have throw rugs and other things on the floor that can make you trip. What can I do with my stairs?  Do not leave any items on the stairs.  Make sure that there are handrails on both sides of the stairs and use them. Fix handrails that are broken or loose. Make sure that handrails are as long as the stairways.  Check any carpeting to make sure that it is firmly attached to the stairs. Fix any carpet that is loose or worn.  Avoid having throw rugs at the top or bottom of the stairs. If you do have throw rugs, attach them to the floor with carpet tape.  Make sure that you have a light switch at the top of the stairs and the bottom of the stairs. If you do not have them, ask  someone to add them for you. What else can I do to help prevent falls?  Wear shoes that:  Do not have high heels.  Have rubber bottoms.  Are comfortable and fit you well.  Are closed at the toe. Do not wear sandals.  If you use a stepladder:  Make sure that it is fully opened. Do not climb a closed stepladder.  Make sure that both sides of the stepladder are locked into place.  Ask someone to hold it for you, if possible.  Clearly mark and make sure that you can see:  Any grab bars or handrails.  First and last steps.  Where the edge of each step is.  Use tools that help you move around (mobility aids) if they are needed. These include:  Canes.  Walkers.  Scooters.  Crutches.  Turn on the lights when you go into a dark area. Replace any light bulbs as soon as they burn out.  Set up your furniture so you have a clear path. Avoid moving your furniture around.  If any of your floors are uneven, fix them.  If there are any pets around you, be aware of where they are.  Review your medicines with your doctor. Some medicines can make you feel dizzy. This can increase your chance of falling. Ask your doctor what other things that you can do to help prevent falls. This information is not intended to replace advice given to you by your health care provider. Make sure you discuss any questions you have with your health care provider. Document Released: 07/17/2009 Document Revised: 02/26/2016 Document Reviewed: 10/25/2014 Elsevier Interactive Patient Education  2017 [employer]

## 2019-05-17 NOTE — Progress Notes (Signed)
Noted.   Letter composed and available to patient

## 2019-06-13 ENCOUNTER — Other Ambulatory Visit: Payer: Self-pay

## 2019-06-13 ENCOUNTER — Ambulatory Visit (INDEPENDENT_AMBULATORY_CARE_PROVIDER_SITE_OTHER): Payer: Medicare HMO

## 2019-06-13 DIAGNOSIS — Z23 Encounter for immunization: Secondary | ICD-10-CM | POA: Diagnosis not present

## 2019-10-15 ENCOUNTER — Ambulatory Visit: Payer: Medicare Other | Attending: Internal Medicine

## 2019-10-15 ENCOUNTER — Ambulatory Visit: Payer: Medicare HMO

## 2019-10-15 DIAGNOSIS — Z23 Encounter for immunization: Secondary | ICD-10-CM

## 2019-10-18 DIAGNOSIS — H5213 Myopia, bilateral: Secondary | ICD-10-CM | POA: Diagnosis not present

## 2019-10-30 ENCOUNTER — Encounter: Payer: Self-pay | Admitting: Family Medicine

## 2019-10-30 ENCOUNTER — Other Ambulatory Visit: Payer: Self-pay

## 2019-10-30 ENCOUNTER — Ambulatory Visit (INDEPENDENT_AMBULATORY_CARE_PROVIDER_SITE_OTHER): Payer: Medicare HMO | Admitting: Family Medicine

## 2019-10-30 ENCOUNTER — Ambulatory Visit (INDEPENDENT_AMBULATORY_CARE_PROVIDER_SITE_OTHER): Payer: Medicare HMO

## 2019-10-30 VITALS — BP 102/70 | HR 74 | Temp 98.4°F | Ht 68.0 in | Wt 186.4 lb

## 2019-10-30 DIAGNOSIS — Z0001 Encounter for general adult medical examination with abnormal findings: Secondary | ICD-10-CM | POA: Diagnosis not present

## 2019-10-30 DIAGNOSIS — Z0000 Encounter for general adult medical examination without abnormal findings: Secondary | ICD-10-CM

## 2019-10-30 DIAGNOSIS — Z860101 Personal history of adenomatous and serrated colon polyps: Secondary | ICD-10-CM

## 2019-10-30 DIAGNOSIS — F411 Generalized anxiety disorder: Secondary | ICD-10-CM

## 2019-10-30 DIAGNOSIS — K219 Gastro-esophageal reflux disease without esophagitis: Secondary | ICD-10-CM

## 2019-10-30 DIAGNOSIS — R053 Chronic cough: Secondary | ICD-10-CM

## 2019-10-30 DIAGNOSIS — I10 Essential (primary) hypertension: Secondary | ICD-10-CM

## 2019-10-30 DIAGNOSIS — I2510 Atherosclerotic heart disease of native coronary artery without angina pectoris: Secondary | ICD-10-CM

## 2019-10-30 DIAGNOSIS — R0602 Shortness of breath: Secondary | ICD-10-CM | POA: Diagnosis not present

## 2019-10-30 DIAGNOSIS — G4700 Insomnia, unspecified: Secondary | ICD-10-CM

## 2019-10-30 DIAGNOSIS — Z86010 Personal history of colonic polyps: Secondary | ICD-10-CM

## 2019-10-30 DIAGNOSIS — E559 Vitamin D deficiency, unspecified: Secondary | ICD-10-CM | POA: Diagnosis not present

## 2019-10-30 DIAGNOSIS — R05 Cough: Secondary | ICD-10-CM | POA: Diagnosis not present

## 2019-10-30 DIAGNOSIS — E785 Hyperlipidemia, unspecified: Secondary | ICD-10-CM

## 2019-10-30 DIAGNOSIS — I739 Peripheral vascular disease, unspecified: Secondary | ICD-10-CM

## 2019-10-30 DIAGNOSIS — N401 Enlarged prostate with lower urinary tract symptoms: Secondary | ICD-10-CM

## 2019-10-30 LAB — CBC WITH DIFFERENTIAL/PLATELET
Basophils Absolute: 0 10*3/uL (ref 0.0–0.1)
Basophils Relative: 0.8 % (ref 0.0–3.0)
Eosinophils Absolute: 0.1 10*3/uL (ref 0.0–0.7)
Eosinophils Relative: 1.7 % (ref 0.0–5.0)
HCT: 45.1 % (ref 39.0–52.0)
Hemoglobin: 15.4 g/dL (ref 13.0–17.0)
Lymphocytes Relative: 29 % (ref 12.0–46.0)
Lymphs Abs: 1.8 10*3/uL (ref 0.7–4.0)
MCHC: 34.1 g/dL (ref 30.0–36.0)
MCV: 88.5 fl (ref 78.0–100.0)
Monocytes Absolute: 0.6 10*3/uL (ref 0.1–1.0)
Monocytes Relative: 10.1 % (ref 3.0–12.0)
Neutro Abs: 3.7 10*3/uL (ref 1.4–7.7)
Neutrophils Relative %: 58.4 % (ref 43.0–77.0)
Platelets: 228 10*3/uL (ref 150.0–400.0)
RBC: 5.09 Mil/uL (ref 4.22–5.81)
RDW: 13.7 % (ref 11.5–15.5)
WBC: 6.3 10*3/uL (ref 4.0–10.5)

## 2019-10-30 LAB — LIPID PANEL
Cholesterol: 118 mg/dL (ref 0–200)
HDL: 35.7 mg/dL — ABNORMAL LOW (ref >39.00)
LDL Cholesterol: 68 mg/dL (ref 0–99)
NonHDL: 82.25
Total CHOL/HDL Ratio: 3
Triglycerides: 71 mg/dL (ref 0.0–149.0)
VLDL: 14.2 mg/dL (ref 0.0–40.0)

## 2019-10-30 LAB — TSH: TSH: 0.59 u[IU]/mL (ref 0.35–4.50)

## 2019-10-30 LAB — COMPREHENSIVE METABOLIC PANEL
ALT: 25 U/L (ref 0–53)
AST: 21 U/L (ref 0–37)
Albumin: 4.3 g/dL (ref 3.5–5.2)
Alkaline Phosphatase: 54 U/L (ref 39–117)
BUN: 19 mg/dL (ref 6–23)
CO2: 29 mEq/L (ref 19–32)
Calcium: 9.8 mg/dL (ref 8.4–10.5)
Chloride: 101 mEq/L (ref 96–112)
Creatinine, Ser: 0.82 mg/dL (ref 0.40–1.50)
GFR: 91.25 mL/min (ref >60.00)
Glucose, Bld: 88 mg/dL (ref 70–99)
Potassium: 4.5 mEq/L (ref 3.5–5.1)
Sodium: 135 mEq/L (ref 135–145)
Total Bilirubin: 0.7 mg/dL (ref 0.2–1.2)
Total Protein: 7.1 g/dL (ref 6.0–8.3)

## 2019-10-30 LAB — VITAMIN D 25 HYDROXY (VIT D DEFICIENCY, FRACTURES): VITD: 49.19 ng/mL (ref 30.00–100.00)

## 2019-10-30 MED ORDER — BENZONATATE 100 MG PO CAPS: 100.0000 mg | ORAL_CAPSULE | Freq: Two times a day (BID) | ORAL | 0 refills | Status: DC | PRN

## 2019-10-30 MED ORDER — TRAZODONE HCL 50 MG PO TABS: 25.0000 mg | ORAL_TABLET | Freq: Every evening | ORAL | 3 refills | Status: DC | PRN

## 2019-10-30 MED ORDER — FLUTICASONE PROPIONATE 50 MCG/ACT NA SUSP: 2.0000 | Freq: Every day | NASAL | 1 refills | Status: DC

## 2019-10-30 NOTE — Progress Notes (Signed)
Phone [phone] In person visit   Subjective:   [name] [name] is a 79 y.o. year old very pleasant male patient who presents for/with See problem oriented charting- chronic cough  This visit occurred during the SARS-CoV-2 public health emergency.  Safety protocols were in place, including screening questions prior to the visit, additional usage of staff PPE, and extensive cleaning of exam room while observing appropriate contact time as indicated for disinfecting solutions.   Past Medical History-  Patient Active Problem List   Diagnosis Date Noted  . CAD (coronary artery disease) 06/21/2011    Priority: High  . Insomnia 04/11/2019    Priority: Medium  . Claudication of lower extremity ([place]) 10/24/2018    Priority: Medium  . BPH (benign prostatic hyperplasia) 09/06/2016    Priority: Medium  . Essential hypertension 08/14/2016    Priority: Medium  . Hyperlipidemia 06/21/2011    Priority: Medium  . Anxiety state 09/06/2016    Priority: Low  . History of adenomatous polyp of colon 08/14/2016    Priority: Low  . Vitamin D deficiency 08/14/2016    Priority: Low  . GERD (gastroesophageal reflux disease)     Priority: Low  . Plantar fasciitis of left foot 06/21/2018    Medications- reviewed and updated Current Outpatient Medications  Medication Sig Dispense Refill  . aspirin 81 MG tablet Take 1 tablet (81 mg total) by mouth daily.    [name] atorvastatin (LIPITOR) 40 MG tablet Take 1 tablet (40 mg total) by mouth daily. 90 tablet 3  . Calcium Carbonate-Vitamin D (CALCIUM + D PO) Take 1 tablet by mouth daily.     [name] glucosamine-chondroitin 500-400 MG tablet Take 1 tablet by mouth 2 (two) times daily.    . Melatonin 10 MG TABS Take 1 tablet by mouth daily.     . metoprolol tartrate (LOPRESSOR) 25 MG tablet Take 1 tablet (25 mg total) by mouth 2 (two) times daily. 180 tablet 3  . Multiple Vitamin (MULTIVITAMIN PO) Take 1 tablet by mouth daily.     . Omega-3 Fatty Acids (FISH  OIL) 1200 MG CAPS Take 1,000 mg by mouth daily.     [name] omeprazole (PRILOSEC) 40 MG capsule Take 1 capsule (40 mg total) by mouth daily. 90 capsule 3  . tamsulosin (FLOMAX) 0.4 MG CAPS capsule Take 1 capsule (0.4 mg total) by mouth daily. 90 capsule 3  . traZODone (DESYREL) 50 MG tablet Take 0.5-1 tablets (25-50 mg total) by mouth at bedtime as needed for sleep. 90 tablet 3  . fluticasone (FLONASE) 50 MCG/ACT nasal spray Place 2 sprays into both nostrils daily. 48 g 1   No current facility-administered medications for this visit.     Objective:  BP 102/70   Pulse 74   Temp 98.4 F (36.9 C)   Ht 5\' 8"  (1.727 m)   Wt 186 lb 6.4 oz (84.6 kg)   SpO2 95%   BMI 28.34 kg/m  Gen: NAD, resting comfortably Mild erythema in posterior pharynx- otherwise largely normal. Mild erythema and edema in nares Lungs clear    Assessment and Plan   # Chronic cough S:Chronic cough- present since last April . For last 3 weeks having some improved cough- zyrtec has not helped when usually would. Be in the past. Already on PPI. No blood in sputum. Some runny nose and congestion but not sinus pain  A/P:  For chronic cough- may be allergy related- he mainly wants to get cxr to evaluate for possible cancer.  Could be allergies- add flonase as above. Not on ace-I. Already on PPI as below 1. Continue zyrtec.  2. Add flonase two sprays each nostril in case this is allergies 3. Continue omprazole because reflux can cause cough 4. Get chest x-ray today 5. Follow up 3 months.  6. Also if no improvement in 1 month on flonase lets try an oral course of prednisone an antiinflammatory to see if that helps 7. I am also going to give you some tessalon which is a prescription to suppress cough- I want you to schedule this to take for 10 days if cough worsens again (glad you have had 3 better weeks recently)    Recommended follow up: 3 months to check in on cough Future Appointments  Date Time Provider Department Center    11/04/2019 11:45 AM [facility]    Lab/Order associations:   ICD-10-CM   12. Chronic cough  R05 DG Chest 2 View    Meds ordered this encounter  Medications  . traZODone (DESYREL) 50 MG tablet    Sig: Take 0.5-1 tablets (25-50 mg total) by mouth at bedtime as needed for sleep.    Dispense:  90 tablet    Refill:  3  . fluticasone (FLONASE) 50 MCG/ACT nasal spray    Sig: Place 2 sprays into both nostrils daily.    Dispense:  48 g    Refill:  1  . benzonatate (TESSALON) 100 MG capsule    Sig: Take 1 capsule (100 mg total) by mouth 2 (two) times daily as needed for cough.    Dispense:  20 capsule    Refill:  0   Return precautions advised.  [name], MD

## 2019-10-30 NOTE — Patient Instructions (Addendum)
Please call Dr. Elnoria Howard to see if you are due for your repeat (I think you are)   For chronic cough 1. Continue zyrtec.  2. Add flonase two sprays each nostril in case this is allergies 3. Continue omprazole because reflux can cause cough 4. Get chest x-ray today 5. Follow up 3 months.  6. Also if no improvement in 1 month on flonase lets try an oral course of prednisone an antiinflammatory to see if that helps 7. I am also going to give you some tessalon which is a prescription to suppress cough- I want you to schedule this to take for 10 days if cough worsens again (glad you have had 3 better weeks recently)  Please stop by lab and x-ray before you go If you do not have mychart- we will call you about results within 5 business days of [place] receiving them.  If you have mychart- we will send your results within 3 business days of [place] receiving them.  If abnormal or we want to clarify a result, we will call or mychart you to make sure you receive the message.  If you have questions or concerns or don't hear within 5-7 days, please send [place] a message or call us.

## 2019-10-30 NOTE — Progress Notes (Signed)
Phone: [phone]   Subjective:  Patient presents today for their annual physical. Chief complaint-noted.   See problem oriented charting- ROS- full  review of systems was completed and negative  except for: congestion, cough, denies sinus pressure, urinary frequency with BPH, seasonal allergies  The following were reviewed and entered/updated in epic: Past Medical History:  Diagnosis Date  . Coronary artery disease    status post PTCA and stenting of his left circumflex artery and right coronary artery  . Hyperlipidemia   . Reflux    omeprazole 40mg    Patient Active Problem List   Diagnosis Date Noted  . CAD (coronary artery disease) 06/21/2011    Priority: High  . Insomnia 04/11/2019    Priority: Medium  . Claudication of lower extremity ([place]) 10/24/2018    Priority: Medium  . BPH (benign prostatic hyperplasia) 09/06/2016    Priority: Medium  . Essential hypertension 08/14/2016    Priority: Medium  . Hyperlipidemia 06/21/2011    Priority: Medium  . Anxiety state 09/06/2016    Priority: Low  . History of adenomatous polyp of colon 08/14/2016    Priority: Low  . Vitamin D deficiency 08/14/2016    Priority: Low  . GERD (gastroesophageal reflux disease)     Priority: Low  . Plantar fasciitis of left foot 06/21/2018   Past Surgical History:  Procedure Laterality Date  . Benign Cyst     on back  . CARDIOVASCULAR STRESS TEST  05-09-2007   EF 64%  . carpal tunnel left    . CYSTECTOMY     removed from finger  . right knee surgery     arthroscopyc 02/2010    Family History  Problem Relation Age of Onset  . Bladder Cancer Mother        passed age 70.   [name] Heart attack Father        75 passed from this  . Alcohol abuse Father     Medications- reviewed and updated Current Outpatient Medications  Medication Sig Dispense Refill  . aspirin 81 MG tablet Take 1 tablet (81 mg total) by mouth daily.    [name] atorvastatin (LIPITOR) 40 MG tablet Take 1 tablet (40 mg total)  by mouth daily. 90 tablet 3  . Calcium Carbonate-Vitamin D (CALCIUM + D PO) Take 1 tablet by mouth daily.     [name] glucosamine-chondroitin 500-400 MG tablet Take 1 tablet by mouth 2 (two) times daily.    . Melatonin 10 MG TABS Take 1 tablet by mouth daily.     . metoprolol tartrate (LOPRESSOR) 25 MG tablet Take 1 tablet (25 mg total) by mouth 2 (two) times daily. 180 tablet 3  . Multiple Vitamin (MULTIVITAMIN PO) Take 1 tablet by mouth daily.     . Omega-3 Fatty Acids (FISH OIL) 1200 MG CAPS Take 1,000 mg by mouth daily.     [name] omeprazole (PRILOSEC) 40 MG capsule Take 1 capsule (40 mg total) by mouth daily. 90 capsule 3  . tamsulosin (FLOMAX) 0.4 MG CAPS capsule Take 1 capsule (0.4 mg total) by mouth daily. 90 capsule 3  . traZODone (DESYREL) 50 MG tablet Take 0.5-1 tablets (25-50 mg total) by mouth at bedtime as needed for sleep. 90 tablet 3  . fluticasone (FLONASE) 50 MCG/ACT nasal spray Place 2 sprays into both nostrils daily. 48 g 1   No current facility-administered medications for this visit.    Allergies-reviewed and updated Allergies  Allergen Reactions  . Simvastatin Other (See Comments)  Leg pain     Social History   Social History Narrative   Married. Wife will be patient of Dr. [place]. 3 grown children, 3 grown stepkids. 9 grandkids.       Retired from [employer]. Over 46 years.    Objective  Objective:  BP 102/70   Pulse 74   Temp 98.4 F (36.9 C)   Ht 5\' 8"  (1.727 m)   Wt 186 lb 6.4 oz (84.6 kg)   SpO2 95%   BMI 28.34 kg/m  Gen: NAD, resting comfortably HEENT: Mucous membranes are moist. Oropharynx normal. TM normal on right, some cerumen in left Neck: stable thyromegaly CV: RRR no murmurs rubs or gallops Lungs: CTAB no crackles, wheeze, rhonchi Abdomen: soft/nontender/nondistended/normal bowel sounds. No rebound or guarding.  Ext: no edema Skin: warm, dry Neuro: grossly normal, moves all extremities, PERRLA   Assessment and Plan  79 y.o. male  presenting for annual physical.  Health Maintenance counseling: 1. Anticipatory guidance: Patient counseled regarding regular dental exams yes q6 months, eye exams yes yearly,  avoiding smoking and second hand smoke yes , limiting alcohol to 0 beverages per day.   2. Risk factor reduction:  Advised patient of need for regular exercise and diet rich and fruits and vegetables to reduce risk of heart attack and stroke. [facility] . Diet- has lost a few lbs since  Last visit down 3- trying to eat healthier diet - "halfway" watching what hes eating- cutting down on portion size Wt Readings from Last 3 Encounters:  10/30/19 186 lb 6.4 oz (84.6 kg)  05/14/19 190 lb 6.4 oz (86.4 kg)  05/07/19 189 lb (85.7 kg)  3. Immunizations/screenings/ancillary studies- discussed covid 19 vaccine - already had first thankfully. Also discussed shingrix at later date- possibly next year Immunization History  Administered Date(s) Administered  . Fluad Quad(high Dose 65+) 06/13/2019  . Influenza, High Dose Seasonal PF 08/28/2017  . Influenza-Unspecified 07/28/2016  . PFIZER SARS-COV-2 Vaccination 10/15/2019  . Pneumococcal Conjugate-13 08/16/2014  . Pneumococcal Polysaccharide-23 07/08/2010  . Tdap 07/08/2010  4. Prostate cancer screening- past age based screening recommendations.  Still follows with urology for BPH- they decide on if he will get PSAs Lab Results  Component Value Date   PSA 0.74 10/20/2017   5. Colon cancer screening - thinks he is due.  We have a report of adenomatous polyp 09/23/2015 with Dr. 09/25/2015 though he thinks he had one in 2017 (had larger polyp and had repeat closer so was moved up to 3 years) -asked patient to reach out to Dr. 2018 to see if he is due now or later in the year- I think likely due   6. Skin cancer screening- no dermatologist but considering. advised regular sunscreen use. Denies worrisome, changing, or new skin lesions.  7. never  smoker  Status of chronic or acute concerns    Coronary artery disease involving native coronary artery of native heart without angina pectoris-denies chest pain or shortness of breath.  Compliant with aspirin and statin-continue current medication   Hyperlipidemia, unspecified hyperlipidemia type - Plan: CBC with Differential/Platelet, Comprehensive metabolic panel, Lipid panel, TSH.  Hopefully well controlled on atorvastatin 40 mg with LDL goal under 70-update lipid panel today   Essential hypertension-well-controlled on metoprolol 25 mg twice a day   Claudication of lower extremity (HCC)-as previously discussed with cardiology and ABIs recommended- states has actually improved.  He has not completed these to date- he states  was told first step would be increased exercise and since he improved with increased exercise he wants to hold off for now   Benign prostatic hyperplasia with lower urinary tract symptoms, symptom details unspecified-continues to follow with urology.  He remains on Flomax  Vitamin D deficiency - Plan: VITAMIN D 25 Hydroxy (Vit-D Deficiency, Fractures)-currently taking   Gastroesophageal reflux disease without esophagitis-remains on omeprazole 40 mg.  Has required esophageal dilation in the past.  We discussed possibly trying 20 mg dose with GI has wanted higher dose for prevention of recurrence- he is going to discuss with Dr. Elnoria Howard.  B12 was normal last year-potentially recheck next year     Anxiety state-Paxil in the past, doing okay without this - trazodone helps with sleep.   Insomnia-would like Trazadone Refilled as helpful-refill today  Thyromegaly-on ultrasound in the past thyromegaly with nodular markedly heterogeneous parenchyma without discrete nodules.  We will continue to monitor TSH   Recommended follow up:  3 month cough follow up- may consider pulmonary visit if not improving.  Future Appointments  Date Time Provider Department Center  11/04/2019 11:45  AM [facility]   Lab/Order associations: Not fasting   ICD-10-CM   1. Preventative health care  Z00.00 CBC with Differential/Platelet    Comprehensive metabolic panel    Lipid panel    VITAMIN D 25 Hydroxy (Vit-D Deficiency, Fractures)    TSH  2. Coronary artery disease involving native coronary artery of native heart without angina pectoris  I25.10   3. Hyperlipidemia, unspecified hyperlipidemia type  E78.5 CBC with Differential/Platelet    Comprehensive metabolic panel    Lipid panel    TSH  4. Essential hypertension  I10   5. Claudication of lower extremity (HCC)  I73.9   6. Benign prostatic hyperplasia with lower urinary tract symptoms, symptom details unspecified  N40.1   7. Vitamin D deficiency  E55.9 VITAMIN D 25 Hydroxy (Vit-D Deficiency, Fractures)  8. History of adenomatous polyp of colon  Z86.010   9. Gastroesophageal reflux disease without esophagitis  K21.9   10. Anxiety state  F41.1    Return precautions advised.  [name], MD

## 2019-10-30 NOTE — Progress Notes (Signed)
Possible pneumonia in the left lung base.  Team lets offer to treat with doxycycline 100 mg twice a day for 7 days #14.  They also noted some possible scarring in the lung bases.  Lets repeat chest x-ray in 6 weeks under chronic cough and please note in the comments that patient was treated with doxycycline.  We have follow-up planned in office in 3 months so we can check on cough and see how it is doing.

## 2019-10-31 ENCOUNTER — Other Ambulatory Visit: Payer: Self-pay

## 2019-10-31 DIAGNOSIS — R053 Chronic cough: Secondary | ICD-10-CM

## 2019-10-31 DIAGNOSIS — R05 Cough: Secondary | ICD-10-CM

## 2019-10-31 MED ORDER — DOXYCYCLINE HYCLATE 100 MG PO TABS: 100.0000 mg | ORAL_TABLET | Freq: Two times a day (BID) | ORAL | 0 refills | Status: DC

## 2019-11-04 ENCOUNTER — Ambulatory Visit: Payer: Medicare HMO | Attending: Internal Medicine

## 2019-11-04 DIAGNOSIS — Z23 Encounter for immunization: Secondary | ICD-10-CM

## 2019-11-04 NOTE — Progress Notes (Signed)
   Covid-19 Vaccination Clinic  Name:  Leiland Mihelich    MRN: [id number] DOB: 02/02/1941  11/04/2019  Mr. Decesare was observed post Covid-19 immunization for 15 minutes without incidence. He was provided with Vaccine Information Sheet and instruction to access the V-Safe system.   Mr. Pickrell was instructed to call 911 with any severe reactions post vaccine: [name] Difficulty breathing  . Swelling of your face and throat  . A fast heartbeat  . A bad rash all over your body  . Dizziness and weakness    Immunizations Administered    Name Date Dose VIS Date Route   Pfizer COVID-19 Vaccine 11/04/2019 10:49 AM 0.3 mL 09/14/2019 Intramuscular   Manufacturer: [employer], [employer]   Lot: [id number]   NDC: [address]-6

## 2019-12-17 ENCOUNTER — Other Ambulatory Visit: Payer: Medicare HMO

## 2019-12-17 ENCOUNTER — Ambulatory Visit (INDEPENDENT_AMBULATORY_CARE_PROVIDER_SITE_OTHER): Payer: Medicare HMO

## 2019-12-17 ENCOUNTER — Other Ambulatory Visit: Payer: Self-pay

## 2019-12-17 ENCOUNTER — Encounter: Payer: Self-pay | Admitting: Family Medicine

## 2019-12-17 DIAGNOSIS — R05 Cough: Secondary | ICD-10-CM

## 2019-12-17 DIAGNOSIS — I700 Atherosclerosis of aorta: Secondary | ICD-10-CM | POA: Insufficient documentation

## 2019-12-17 DIAGNOSIS — R053 Chronic cough: Secondary | ICD-10-CM

## 2019-12-20 ENCOUNTER — Other Ambulatory Visit: Payer: Self-pay

## 2019-12-20 ENCOUNTER — Telehealth: Payer: Self-pay | Admitting: Family Medicine

## 2019-12-20 DIAGNOSIS — H409 Unspecified glaucoma: Secondary | ICD-10-CM

## 2019-12-20 DIAGNOSIS — H40053 Ocular hypertension, bilateral: Secondary | ICD-10-CM | POA: Diagnosis not present

## 2019-12-20 NOTE — Telephone Encounter (Signed)
Patient called in this morning requesting a referral for [facility], and also asked if he could have someone call him about his x-ray results as he seen the results through MyChart but doesn't understand it.

## 2019-12-20 NOTE — Telephone Encounter (Signed)
Called and spoke with pt, referral placed and question answered regarding xray results.

## 2020-01-22 ENCOUNTER — Other Ambulatory Visit: Payer: Self-pay

## 2020-01-22 MED ORDER — TAMSULOSIN HCL 0.4 MG PO CAPS: 0.4000 mg | ORAL_CAPSULE | Freq: Every day | ORAL | 3 refills | Status: DC

## 2020-01-28 ENCOUNTER — Other Ambulatory Visit: Payer: Self-pay

## 2020-01-28 ENCOUNTER — Ambulatory Visit: Payer: Medicare HMO

## 2020-01-28 ENCOUNTER — Encounter: Payer: Self-pay | Admitting: Family Medicine

## 2020-01-28 ENCOUNTER — Ambulatory Visit (INDEPENDENT_AMBULATORY_CARE_PROVIDER_SITE_OTHER): Payer: Medicare HMO | Admitting: Family Medicine

## 2020-01-28 VITALS — BP 128/78 | HR 69 | Temp 97.8°F | Ht 68.0 in | Wt 181.0 lb

## 2020-01-28 DIAGNOSIS — E785 Hyperlipidemia, unspecified: Secondary | ICD-10-CM | POA: Diagnosis not present

## 2020-01-28 DIAGNOSIS — J309 Allergic rhinitis, unspecified: Secondary | ICD-10-CM

## 2020-01-28 DIAGNOSIS — R102 Pelvic and perineal pain: Secondary | ICD-10-CM

## 2020-01-28 DIAGNOSIS — Z0000 Encounter for general adult medical examination without abnormal findings: Secondary | ICD-10-CM

## 2020-01-28 DIAGNOSIS — I700 Atherosclerosis of aorta: Secondary | ICD-10-CM | POA: Diagnosis not present

## 2020-01-28 LAB — POC URINALSYSI DIPSTICK (AUTOMATED)
Bilirubin, UA: NEGATIVE
Blood, UA: NEGATIVE
Glucose, UA: NEGATIVE
Ketones, UA: NEGATIVE
Leukocytes, UA: NEGATIVE
Nitrite, UA: NEGATIVE
Protein, UA: NEGATIVE
Spec Grav, UA: 1.02 (ref 1.010–1.025)
Urobilinogen, UA: 0.2 E.U./dL
pH, UA: 7 (ref 5.0–8.0)

## 2020-01-28 NOTE — Assessment & Plan Note (Signed)
#  hyperlipidemia/aortic atherosclerosis- incidentally noted on chest x-ray at last visit S: Medication:atorvastatin 40mg  , asa 81mg  Lab Results  Component Value Date   CHOL 118 10/30/2019   HDL 35.70 (L) 10/30/2019   LDLCALC 68 10/30/2019   LDLDIRECT 74.0 09/07/2017   TRIG 71.0 10/30/2019   CHOLHDL 3 10/30/2019   A/P: hyperlipidemia at goal with LDL at goal under 70. This is also the goal for aortic atherosclerosis ot prevent progression of plaque- continue risk factor modification for aortic atherosclerosis

## 2020-01-28 NOTE — Addendum Note (Signed)
Addended by: Jorene Guest on: 01/28/2020 11:49 AM   Modules accepted: Orders

## 2020-01-28 NOTE — Progress Notes (Signed)
Phone [phone] In person visit   Subjective:   [name] [name] is a 79 y.o. year old very pleasant male patient who presents for/with See problem oriented charting Chief Complaint  Patient presents with  . Follow-up    Patient mentioned that his cough has better since taking the antibiotic. He mentioned that he has been having some abdominal pain for the last 2 weeks below his belly button. He stated that it feels like has to urinate but his is unable to go.    This visit occurred during the SARS-CoV-2 public health emergency.  Safety protocols were in place, including screening questions prior to the visit, additional usage of staff PPE, and extensive cleaning of exam room while observing appropriate contact time as indicated for disinfecting solutions.   Past Medical History-  Patient Active Problem List   Diagnosis Date Noted  . CAD (coronary artery disease) 06/21/2011    Priority: High  . Aortic atherosclerosis (HCC) 12/17/2019    Priority: Medium  . Insomnia 04/11/2019    Priority: Medium  . Claudication of lower extremity (HCC) 10/24/2018    Priority: Medium  . BPH (benign prostatic hyperplasia) 09/06/2016    Priority: Medium  . Essential hypertension 08/14/2016    Priority: Medium  . Hyperlipidemia 06/21/2011    Priority: Medium  . Allergic rhinitis 01/28/2020    Priority: Low  . Anxiety state 09/06/2016    Priority: Low  . History of adenomatous polyp of colon 08/14/2016    Priority: Low  . Vitamin D deficiency 08/14/2016    Priority: Low  . GERD (gastroesophageal reflux disease)     Priority: Low  . Plantar fasciitis of left foot 06/21/2018    Medications- reviewed and updated Current Outpatient Medications  Medication Sig Dispense Refill  . aspirin 81 MG tablet Take 1 tablet (81 mg total) by mouth daily.    [name] atorvastatin (LIPITOR) 40 MG tablet Take 1 tablet (40 mg total) by mouth daily. 90 tablet 3  . Calcium Carbonate-Vitamin D (CALCIUM + D PO)  Take 1 tablet by mouth daily.     . fluticasone (FLONASE) 50 MCG/ACT nasal spray Place 2 sprays into both nostrils daily. (Patient taking differently: Place 2 sprays into both nostrils as needed. ) 48 g 1  . glucosamine-chondroitin 500-400 MG tablet Take 1 tablet by mouth 2 (two) times daily.    . Melatonin 10 MG TABS Take 1 tablet by mouth daily.     . metoprolol tartrate (LOPRESSOR) 25 MG tablet Take 1 tablet (25 mg total) by mouth 2 (two) times daily. 180 tablet 3  . Multiple Vitamin (MULTIVITAMIN PO) Take 1 tablet by mouth daily.     . Omega-3 Fatty Acids (FISH OIL) 1200 MG CAPS Take 1,000 mg by mouth daily.     [name] omeprazole (PRILOSEC) 40 MG capsule Take 1 capsule (40 mg total) by mouth daily. 90 capsule 3  . tamsulosin (FLOMAX) 0.4 MG CAPS capsule Take 1 capsule (0.4 mg total) by mouth daily. 90 capsule 3  . traZODone (DESYREL) 50 MG tablet Take 0.5-1 tablets (25-50 mg total) by mouth at bedtime as needed for sleep. (Patient taking differently: Take 25 mg by mouth at bedtime. ) 90 tablet 3  . benzonatate (TESSALON) 100 MG capsule Take 1 capsule (100 mg total) by mouth 2 (two) times daily as needed for cough. (Patient not taking: Reported on 01/28/2020) 20 capsule 0   No current facility-administered medications for this visit.     Objective:  BP 128/78   Pulse 69   Temp 97.8 F (36.6 C) (Temporal)   Ht 5\' 8"  (1.727 m)   Wt 181 lb (82.1 kg)   SpO2 97%   BMI 27.52 kg/m  Gen: NAD, resting comfortably CV: RRR no murmurs rubs or gallops Lungs: CTAB no crackles, wheeze, rhonchi Abdomen: soft/nontender except very slight lower abdominal/suprapubic pain/nondistended/normal bowel sounds. No rebound or guarding.  Ext: no edema Skin: warm, dry    Assessment and Plan   # Chronic Cough S: Patient was evaluated for chronic cough at time of his physical October 30, 2019.  Cough has been present since April 2020.  Zyrtec had slightly improved cough at that time.  He was already on a PPI.   There is no blood in his sputum.  Did not have sinus pain but had some runny nose and congestion.  At that time we added Flonase.  He is not on an ACE inhibitor.  We continued Zyrtec.  We continued omeprazole.  Chest x-ray was reassuring.  Plan was 36-month follow-up.  We also tried [employer] as well as a course of doxycycline at his request after the visit. A/P: Chronic cough resolved with course of doxycycline-very reassuring.  Glad he is doing better. Only occasionally using flonase or zyrtec- Glaucoma- so caution with flonase. Eye doctor also told him to be careful with zyrtec- he is using sparingly as result. Not having to use tessalon  # Lower abdominal pain/constipation S:last weekend started with abdominal pain. Then felt better for about 5 days and symptoms worsened again this weekend. He is starting to feel better today. Suprapubic/lower abdominal pain. Pain at its worst gets up to 6-7/10. No increased frequency of urination from baseline and no pain.    getting smaller bowel movements in the last few weeks and did not have BMs over the weekend.   Colonoscopy 2017 with large polyp- 1 year repeat in 2018 and plan at this point unclear- 3-5 years it sounds like- he has planned to call Dr. Benson Norway to verify next visit.  A/P: This sounds like constipation related pain. Need to make sure he is up to date on colonoscopy (getting records) but sounds like he is.  We will get a urine test to make sure no urinary tract infection.  We will try a course of MiraLAX as per after visit summary.  If he is not better in 1 to 2 weeks encouraged him to follow-up-could consider imaging at that point  #hyperlipidemia/aortic atherosclerosis- incidentally noted on chest x-ray at last visit S: Medication:atorvastatin 40mg  , asa 81mg  Lab Results  Component Value Date   CHOL 118 10/30/2019   HDL 35.70 (L) 10/30/2019   LDLCALC 68 10/30/2019   LDLDIRECT 74.0 09/07/2017   TRIG 71.0 10/30/2019   CHOLHDL 3  10/30/2019   A/P: hyperlipidemia at goal with LDL at goal under 70. This is also the goal for aortic atherosclerosis ot prevent progression of plaque- continue risk factor modification for aortic atherosclerosis  Recommended follow up: Return for as needed for new or worsening symptoms. asked him to see me back   Lab/Order associations:   ICD-10-CM   1. Suprapubic abdominal pain  R10.2 POCT Urinalysis Dipstick (Automated)  2. Hyperlipidemia, unspecified hyperlipidemia type  E78.5   3. Aortic atherosclerosis (HCC)  I70.0   4. Allergic rhinitis, unspecified seasonality, unspecified trigger  J30.9     Return precautions advised.  Garret Reddish, MD

## 2020-01-28 NOTE — Patient Instructions (Addendum)
Please stop by lab before you go If you have mychart- we will send your results within 3 business days of [place] receiving them.  If you do not have mychart- we will call you about results within 5 business days of [place] receiving them.   Buy MiraLAX at the pharmacy over-the-counter.  I would recommend a full capful of MiraLAX mixed in water daily for 7 days.  If you start to have really loose stool I would hold for a day or 2 and then use a half a capful.  If this seems to clear things up as far as the lower abdominal pain and constipation may use as needed again in the future.  I also would follow-up with Dr. Elnoria Howard but it sounds like the earliest you would be due for colonoscopy would be this year and more likely 2023-I am happy to place a referral for this if needed  Id like to get a copy of last colonoscopy x2 as well Sign release of information at the check out desk for last colonoscopy and pathology.   Recommended follow up: Return for as needed for new or worsening symptoms. if not better within 1-2 weeks see [place] back and we can consider further workup.   Toni Amend will be right in

## 2020-01-28 NOTE — Progress Notes (Signed)
Subjective:   [name] [name] is a 79 y.o. male who presents for Medicare Annual/Subsequent preventive examination.  Review of Systems:   Cardiac Risk Factors include: advanced age (>73men, >36 women);male gender;dyslipidemia;hypertension     Objective:    Vitals: BP 128/78   Pulse 69   Temp 97.8 F (36.6 C) (Temporal)   Ht 5\' 8"  (1.727 m)   Wt 181 lb (82.1 kg)   SpO2 97%   BMI 27.52 kg/m   Body mass index is 27.52 kg/m.  Advanced Directives 01/28/2020 05/14/2019 03/02/2018  Does Patient Have a Medical Advance Directive? Yes No No  Type of Advance Directive Living will;Healthcare Power of Attorney - -  Does patient want to make changes to medical advance directive? No - Patient declined - -  Copy of [place] in Chart? No - copy requested - -  Would patient like information on creating a medical advance directive? - Yes (MAU/Ambulatory/Procedural Areas - Information given) -    Tobacco Social History   Tobacco Use  Smoking Status Never Smoker  Smokeless Tobacco Never Used     Counseling given: Not Answered   Clinical Intake:  Pre-visit preparation completed: Yes  Pain : No/denies pain  Diabetes: No  How often do you need to have someone help you when you read instructions, pamphlets, or other written materials from your doctor or pharmacy?: 1 - Never  Interpreter Needed?: No  Information entered by :: Denman George LPN  Past Medical History:  Diagnosis Date  . Coronary artery disease    status post PTCA and stenting of his left circumflex artery and right coronary artery  . Hyperlipidemia   . Reflux    omeprazole 40mg    Past Surgical History:  Procedure Laterality Date  . Benign Cyst     on back  . CARDIOVASCULAR STRESS TEST  05-09-2007   EF 64%  . carpal tunnel left    . CYSTECTOMY     removed from finger  . right knee surgery     arthroscopyc 02/2010   Family History  Problem Relation Age of Onset  . Bladder Cancer  Mother        passed age 61.   [name] Heart attack Father        42 passed from this  . Alcohol abuse Father    Social History   Socioeconomic History  . Marital status: Married    Spouse name: Not on file  . Number of children: Not on file  . Years of education: Not on file  . Highest education level: Not on file  Occupational History  . Not on file  Tobacco Use  . Smoking status: Never Smoker  . Smokeless tobacco: Never Used  Substance and Sexual Activity  . Alcohol use: No  . Drug use: No  . Sexual activity: Not on file  Other Topics Concern  . Not on file  Social History Narrative   Married. Wife will be patient of Dr. [place]. 3 grown children, 3 grown stepkids. 9 grandkids.       Retired from [employer]. Over 46 years.    Social Determinants of Health   Financial Resource Strain:   . Difficulty of Paying Living Expenses:   Food Insecurity:   . Worried About Charity fundraiser in the Last Year:   . Arboriculturist in the Last Year:   Transportation Needs:   . Film/video editor (Medical):   [name]  Lack of Transportation (Non-Medical):   Physical Activity:   . Days of Exercise per Week:   . Minutes of Exercise per Session:   Stress:   . Feeling of Stress :   Social Connections:   . Frequency of Communication with Friends and Family:   . Frequency of Social Gatherings with Friends and Family:   . Attends Religious Services:   . Active Member of Clubs or Organizations:   . Attends Banker Meetings:   [name] Marital Status:     Outpatient Encounter Medications as of 01/28/2020  Medication Sig  . aspirin 81 MG tablet Take 1 tablet (81 mg total) by mouth daily.  [name] atorvastatin (LIPITOR) 40 MG tablet Take 1 tablet (40 mg total) by mouth daily.  . benzonatate (TESSALON) 100 MG capsule Take 1 capsule (100 mg total) by mouth 2 (two) times daily as needed for cough. (Patient not taking: Reported on 01/28/2020)  . Calcium Carbonate-Vitamin D (CALCIUM + D PO)  Take 1 tablet by mouth daily.   . fluticasone (FLONASE) 50 MCG/ACT nasal spray Place 2 sprays into both nostrils daily. (Patient taking differently: Place 2 sprays into both nostrils as needed. )  . glucosamine-chondroitin 500-400 MG tablet Take 1 tablet by mouth 2 (two) times daily.  . Melatonin 10 MG TABS Take 1 tablet by mouth daily.   . metoprolol tartrate (LOPRESSOR) 25 MG tablet Take 1 tablet (25 mg total) by mouth 2 (two) times daily.  . Multiple Vitamin (MULTIVITAMIN PO) Take 1 tablet by mouth daily.   . Omega-3 Fatty Acids (FISH OIL) 1200 MG CAPS Take 1,000 mg by mouth daily.   [name] omeprazole (PRILOSEC) 40 MG capsule Take 1 capsule (40 mg total) by mouth daily.  . tamsulosin (FLOMAX) 0.4 MG CAPS capsule Take 1 capsule (0.4 mg total) by mouth daily.  . traZODone (DESYREL) 50 MG tablet Take 0.5-1 tablets (25-50 mg total) by mouth at bedtime as needed for sleep. (Patient taking differently: Take 25 mg by mouth at bedtime. )   No facility-administered encounter medications on file as of 01/28/2020.    Activities of Daily Living In your present state of health, do you have any difficulty performing the following activities: 01/28/2020 05/14/2019  Hearing? N N  Vision? N N  Difficulty concentrating or making decisions? N N  Walking or climbing stairs? N N  Dressing or bathing? N -  Doing errands, shopping? N N  Preparing Food and eating ? N N  Using the Toilet? N N  In the past six months, have you accidently leaked urine? N N  Do you have problems with loss of bowel control? N N  Managing your Medications? N N  Managing your Finances? N N  Housekeeping or managing your Housekeeping? N -  Some recent data might be hidden    Patient Care Team: Shelva Majestic, MD as PCP - General (Family Medicine) Nahser, Deloris Ping, MD as PCP - Cardiology (Cardiology) Blima Ledger, OD as Consulting Physician (Optometry) Callie Fielding, MD as Consulting Physician (Physical Medicine and  Rehabilitation) Rene Paci, MD as Consulting Physician (Urology)   Assessment:   This is a routine wellness examination for [name] [name].  Exercise Activities and Dietary recommendations Current Exercise Habits: Structured exercise class, Type of exercise: walking;stretching, Time (Minutes): 30, Frequency (Times/Week): 3, Weekly Exercise (Minutes/Week): 90, Intensity: Mild  Goals    . Exercise 150 min/wk Moderate Activity     Wants to work on getting back the [employer] activities in  the near future     . Weight (lb) < 188 lb (85.3 kg)     Stop eating late at hs. Curtail bread, sugar, sweets  Check out  online nutrition programs as WikiBlast.com.cy and LimitLaws.com.cy; fit74me; Look for foods with "whole" wheat; bran; oatmeal etc Shot at the farmer's markets in season for fresher choices  Watch for "hydrogenated" on the label of oils which are trans-fats.  Watch for "high fructose corn syrup" in snacks, yogurt or ketchup  Meats have less marbling; bright colored fruits and vegetables;  Canned; dump out liquid and wash vegetables. Be mindful of what we are eating  Portion control is essential to a health weight! Sit down; take a break and enjoy your meal; take smaller bites; put the fork down between bites;  It takes 20 minutes to get full; so check in with your fullness cues and stop eating when you start to fill full              Fall Risk Fall Risk  01/28/2020 01/28/2020 03/02/2018 09/07/2017 07/28/2016  Falls in the past year? 0 0 No No No  Number falls in past yr: 0 0 - - -  Injury with Fall? 0 0 - - -  Follow up Falls evaluation completed;Education provided;Falls prevention discussed - - - -   Is the patient's home free of loose throw rugs in walkways, pet beds, electrical cords, etc?   yes      Grab bars in the bathroom? yes      Handrails on the stairs?   yes      Adequate lighting?   yes  Timed Get Up and Go Performed: completed and within normal timeframe; no  gait abnormalities noted   Depression Screen PHQ 2/9 Scores 01/28/2020 04/11/2019 03/02/2018 09/07/2017  PHQ - 2 Score 0 0 0 0    Cognitive Function MMSE - Mini Mental State Exam 03/02/2018  Not completed: (No Data)     6CIT Screen 01/28/2020 05/14/2019  What Year? 0 points 0 points  What month? 0 points 0 points  What time? 0 points 0 points  Count back from 20 0 points 0 points  Months in reverse 0 points 0 points  Repeat phrase 0 points 0 points  Total Score 0 0    Immunization History  Administered Date(s) Administered  . Fluad Quad(high Dose 65+) 06/13/2019  . Influenza, High Dose Seasonal PF 08/28/2017  . Influenza-Unspecified 07/28/2016  . PFIZER SARS-COV-2 Vaccination 10/15/2019, 11/04/2019  . Pneumococcal Conjugate-13 08/16/2014  . Pneumococcal Polysaccharide-23 07/08/2010  . Tdap 07/08/2010    Qualifies for Shingles Vaccine? Discussed and patient will check with pharmacy for coverage.  Patient education handout provided   Screening Tests Health Maintenance  Topic Date Due  . COLONOSCOPY  04/28/2020 (Originally 09/08/2015)  . INFLUENZA VACCINE  05/04/2020  . TETANUS/TDAP  07/08/2020  . COVID-19 Vaccine  Completed  . PNA vac Low Risk Adult  Completed   Cancer Screenings: Lung: Low Dose CT Chest recommended if Age 56-80 years, 30 pack-year currently smoking OR have quit w/in 15years. Patient does not qualify. Colorectal: will request records from last colonoscopy; patient would like to defer at this tiem      Plan:  I have personally reviewed and addressed the Medicare Annual Wellness questionnaire and have noted the following in the patient's chart:  A. Medical and social history B. Use of alcohol, tobacco or illicit drugs  C. Current medications and supplements D. Functional ability and status E.  Nutritional status F.  Physical activity G. Advance directives H. List of other physicians I.  Hospitalizations, surgeries, and ER visits in previous 12 months J.   Vitals K. Screenings such as hearing and vision if needed, cognitive and depression L. Referrals, records requested, and appointments- colonoscopy records   In addition, I have reviewed and discussed with patient certain preventive protocols, quality metrics, and best practice recommendations. A written personalized care plan for preventive services as well as general preventive health recommendations were provided to patient.   Signed,  Kandis Fantasia, LPN  Nurse Health Advisor   Nurse Notes: no additional

## 2020-01-28 NOTE — Progress Notes (Deleted)
.  shb

## 2020-01-28 NOTE — Patient Instructions (Signed)
[name] [name] , Thank you for taking time to come for your Medicare Wellness Visit. I appreciate your ongoing commitment to your health goals. Please review the following plan we discussed and let me know if I can assist you in the future.   Screening recommendations/referrals: Colorectal Screening: we will get the notes from your last few colonoscopy notes   Vision and Dental Exams: Recommended annual ophthalmology exams for early detection of glaucoma and other disorders of the eye Recommended annual dental exams for proper oral hygiene  Vaccinations: Influenza vaccine: completed  Pneumococcal vaccine: up to date; last 08/16/14 Tdap vaccine: up to date; last 07/08/10  Shingles vaccine: You may receive this vaccine at your local pharmacy. (see handout)  Covid vaccine:  Completed   Advanced directives: Please bring a copy of your POA (Power of Attorney) and/or Living Will to your next appointment.  Goals: Recommend to drink at least 6-8 8oz glasses of water per day and consume a balanced diet rich in fresh fruits and vegetables.   Next appointment: Please schedule your Annual Wellness Visit with your Nurse Health Advisor in one year.  Preventive Care 77 Years and Older, Male Preventive care refers to lifestyle choices and visits with your health care provider that can promote health and wellness. What does preventive care include?  A yearly physical exam. This is also called an annual well check.  Dental exams once or twice a year.  Routine eye exams. Ask your health care provider how often you should have your eyes checked.  Personal lifestyle choices, including:  Daily care of your teeth and gums.  Regular physical activity.  Eating a healthy diet.  Avoiding tobacco and drug use.  Limiting alcohol use.  Practicing safe sex.  Taking low doses of aspirin every day if recommended by your health care provider..  Taking vitamin and mineral supplements as recommended by your  health care provider. What happens during an annual well check? The services and screenings done by your health care provider during your annual well check will depend on your age, overall health, lifestyle risk factors, and family history of disease. Counseling  Your health care provider may ask you questions about your:  Alcohol use.  Tobacco use.  Drug use.  Emotional well-being.  Home and relationship well-being.  Sexual activity.  Eating habits.  History of falls.  Memory and ability to understand (cognition).  Work and work Astronomer. Screening  You may have the following tests or measurements:  Height, weight, and BMI.  Blood pressure.  Lipid and cholesterol levels. These may be checked every 5 years, or more frequently if you are over 77 years old.  Skin check.  Lung cancer screening. You may have this screening every year starting at age 77 if you have a 30-pack-year history of smoking and currently smoke or have quit within the past 15 years.  Fecal occult blood test (FOBT) of the stool. You may have this test every year starting at age 77.  Flexible sigmoidoscopy or colonoscopy. You may have a sigmoidoscopy every 5 years or a colonoscopy every 10 years starting at age 77.  Prostate cancer screening. Recommendations will vary depending on your family history and other risks.  Hepatitis C blood test.  Hepatitis B blood test.  Sexually transmitted disease (STD) testing.  Diabetes screening. This is done by checking your blood sugar (glucose) after you have not eaten for a while (fasting). You may have this done every 1-3 years.  Abdominal aortic aneurysm (AAA)  screening. You may need this if you are a current or former smoker.  Osteoporosis. You may be screened starting at age 77 if you are at high risk. Talk with your health care provider about your test results, treatment options, and if necessary, the need for more tests. Vaccines  Your health  care provider may recommend certain vaccines, such as:  Influenza vaccine. This is recommended every year.  Tetanus, diphtheria, and acellular pertussis (Tdap, Td) vaccine. You may need a Td booster every 10 years.  Zoster vaccine. You may need this after age 77.  Pneumococcal 13-valent conjugate (PCV13) vaccine. One dose is recommended after age 77.  Pneumococcal polysaccharide (PPSV23) vaccine. One dose is recommended after age 77. Talk to your health care provider about which screenings and vaccines you need and how often you need them. This information is not intended to replace advice given to you by your health care provider. Make sure you discuss any questions you have with your health care provider. Document Released: 10/17/2015 Document Revised: 06/09/2016 Document Reviewed: 07/22/2015 Elsevier Interactive Patient Education  2017 [place] Prevention in the Home Falls can cause injuries. They can happen to people of all ages. There are many things you can do to make your home safe and to help prevent falls. What can I do on the outside of my home?  Regularly fix the edges of walkways and driveways and fix any cracks.  Remove anything that might make you trip as you walk through a door, such as a raised step or threshold.  Trim any bushes or trees on the path to your home.  Use bright outdoor lighting.  Clear any walking paths of anything that might make someone trip, such as rocks or tools.  Regularly check to see if handrails are loose or broken. Make sure that both sides of any steps have handrails.  Any raised decks and porches should have guardrails on the edges.  Have any leaves, snow, or ice cleared regularly.  Use sand or salt on walking paths during winter.  Clean up any spills in your garage right away. This includes oil or grease spills. What can I do in the bathroom?  Use night lights.  Install grab bars by the toilet and in the tub and  shower. Do not use towel bars as grab bars.  Use non-skid mats or decals in the tub or shower.  If you need to sit down in the shower, use a plastic, non-slip stool.  Keep the floor dry. Clean up any water that spills on the floor as soon as it happens.  Remove soap buildup in the tub or shower regularly.  Attach bath mats securely with double-sided non-slip rug tape.  Do not have throw rugs and other things on the floor that can make you trip. What can I do in the bedroom?  Use night lights.  Make sure that you have a light by your bed that is easy to reach.  Do not use any sheets or blankets that are too big for your bed. They should not hang down onto the floor.  Have a firm chair that has side arms. You can use this for support while you get dressed.  Do not have throw rugs and other things on the floor that can make you trip. What can I do in the kitchen?  Clean up any spills right away.  Avoid walking on wet floors.  Keep items that you use a lot in easy-to-reach  places.  If you need to reach something above you, use a strong step stool that has a grab bar.  Keep electrical cords out of the way.  Do not use floor polish or wax that makes floors slippery. If you must use wax, use non-skid floor wax.  Do not have throw rugs and other things on the floor that can make you trip. What can I do with my stairs?  Do not leave any items on the stairs.  Make sure that there are handrails on both sides of the stairs and use them. Fix handrails that are broken or loose. Make sure that handrails are as long as the stairways.  Check any carpeting to make sure that it is firmly attached to the stairs. Fix any carpet that is loose or worn.  Avoid having throw rugs at the top or bottom of the stairs. If you do have throw rugs, attach them to the floor with carpet tape.  Make sure that you have a light switch at the top of the stairs and the bottom of the stairs. If you do not  have them, ask someone to add them for you. What else can I do to help prevent falls?  Wear shoes that:  Do not have high heels.  Have rubber bottoms.  Are comfortable and fit you well.  Are closed at the toe. Do not wear sandals.  If you use a stepladder:  Make sure that it is fully opened. Do not climb a closed stepladder.  Make sure that both sides of the stepladder are locked into place.  Ask someone to hold it for you, if possible.  Clearly mark and make sure that you can see:  Any grab bars or handrails.  First and last steps.  Where the edge of each step is.  Use tools that help you move around (mobility aids) if they are needed. These include:  Canes.  Walkers.  Scooters.  Crutches.  Turn on the lights when you go into a dark area. Replace any light bulbs as soon as they burn out.  Set up your furniture so you have a clear path. Avoid moving your furniture around.  If any of your floors are uneven, fix them.  If there are any pets around you, be aware of where they are.  Review your medicines with your doctor. Some medicines can make you feel dizzy. This can increase your chance of falling. Ask your doctor what other things that you can do to help prevent falls. This information is not intended to replace advice given to you by your health care provider. Make sure you discuss any questions you have with your health care provider. Document Released: 07/17/2009 Document Revised: 02/26/2016 Document Reviewed: 10/25/2014 Elsevier Interactive Patient Education  2017 [employer].

## 2020-01-30 ENCOUNTER — Other Ambulatory Visit: Payer: Self-pay | Admitting: Family Medicine

## 2020-02-04 DIAGNOSIS — K219 Gastro-esophageal reflux disease without esophagitis: Secondary | ICD-10-CM | POA: Diagnosis not present

## 2020-02-04 DIAGNOSIS — K625 Hemorrhage of anus and rectum: Secondary | ICD-10-CM | POA: Diagnosis not present

## 2020-02-04 DIAGNOSIS — I2510 Atherosclerotic heart disease of native coronary artery without angina pectoris: Secondary | ICD-10-CM | POA: Diagnosis not present

## 2020-02-04 DIAGNOSIS — Z86010 Personal history of colonic polyps: Secondary | ICD-10-CM | POA: Diagnosis not present

## 2020-02-12 DIAGNOSIS — Z86010 Personal history of colonic polyps: Secondary | ICD-10-CM | POA: Diagnosis not present

## 2020-02-12 DIAGNOSIS — K5730 Diverticulosis of large intestine without perforation or abscess without bleeding: Secondary | ICD-10-CM | POA: Diagnosis not present

## 2020-05-11 ENCOUNTER — Encounter: Payer: Self-pay | Admitting: Cardiovascular Disease

## 2020-05-11 NOTE — Progress Notes (Signed)
This encounter was created in error - please disregard.

## 2020-05-12 ENCOUNTER — Encounter: Payer: Medicare HMO | Admitting: Cardiovascular Disease

## 2020-05-14 ENCOUNTER — Telehealth: Payer: Self-pay | Admitting: Family Medicine

## 2020-05-14 NOTE — Progress Notes (Signed)
  Chronic Care Management   Outreach Note  05/14/2020 Name: Kayston Jodoin MRN: [id number] DOB: Dec 03, 1940  Referred by: Shelva Majestic, MD Reason for referral : No chief complaint on file.   An unsuccessful telephone outreach was attempted today. The patient was referred to the pharmacist for assistance with care management and care coordination.   Follow Up Plan:   Lynnae January Upstream Scheduler

## 2020-06-04 ENCOUNTER — Telehealth: Payer: Self-pay | Admitting: Family Medicine

## 2020-06-04 NOTE — Progress Notes (Signed)
  Chronic Care Management   Outreach Note  06/04/2020 Name: [name] [name] MRN: [id number] DOB: [birth year]  Referred by: Shelva Majestic, MD Reason for referral : No chief complaint on file.   An unsuccessful telephone outreach was attempted today. The patient was referred to the pharmacist for assistance with care management and care coordination.   Follow Up Plan:   Lynnae January Upstream Scheduler

## 2020-06-11 ENCOUNTER — Encounter: Payer: Self-pay | Admitting: Cardiovascular Disease

## 2020-06-11 ENCOUNTER — Ambulatory Visit: Payer: Medicare HMO | Admitting: Cardiovascular Disease

## 2020-06-11 ENCOUNTER — Other Ambulatory Visit: Payer: Self-pay

## 2020-06-11 VITALS — BP 120/62 | HR 72 | Ht 68.0 in | Wt 168.0 lb

## 2020-06-11 DIAGNOSIS — I2510 Atherosclerotic heart disease of native coronary artery without angina pectoris: Secondary | ICD-10-CM | POA: Diagnosis not present

## 2020-06-11 DIAGNOSIS — E785 Hyperlipidemia, unspecified: Secondary | ICD-10-CM | POA: Diagnosis not present

## 2020-06-11 LAB — HEPATIC FUNCTION PANEL
ALT: 23 IU/L (ref 0–44)
AST: 18 IU/L (ref 0–40)
Albumin: 4.3 g/dL (ref 3.7–4.7)
Alkaline Phosphatase: 63 IU/L (ref 48–121)
Bilirubin Total: 0.5 mg/dL (ref 0.0–1.2)
Bilirubin, Direct: 0.16 mg/dL (ref 0.00–0.40)
Total Protein: 6.7 g/dL (ref 6.0–8.5)

## 2020-06-11 LAB — BASIC METABOLIC PANEL
BUN/Creatinine Ratio: 20 (ref 10–24)
BUN: 15 mg/dL (ref 8–27)
CO2: 27 mmol/L (ref 20–29)
Calcium: 9.8 mg/dL (ref 8.6–10.2)
Chloride: 100 mmol/L (ref 96–106)
Creatinine, Ser: 0.75 mg/dL — ABNORMAL LOW (ref 0.76–1.27)
GFR calc Af Amer: 103 mL/min/{1.73_m2} (ref >59)
GFR calc non Af Amer: 89 mL/min/{1.73_m2} (ref >59)
Glucose: 89 mg/dL (ref 65–99)
Potassium: 4.2 mmol/L (ref 3.5–5.2)
Sodium: 138 mmol/L (ref 134–144)

## 2020-06-11 LAB — LIPID PANEL
Chol/HDL Ratio: 2.6 ratio (ref 0.0–5.0)
Cholesterol, Total: 108 mg/dL (ref 100–199)
HDL: 41 mg/dL (ref >39)
LDL Chol Calc (NIH): 54 mg/dL (ref 0–99)
Triglycerides: 60 mg/dL (ref 0–149)
VLDL Cholesterol Cal: 13 mg/dL (ref 5–40)

## 2020-06-11 MED ORDER — METOPROLOL TARTRATE 25 MG PO TABS
25.0000 mg | ORAL_TABLET | Freq: Two times a day (BID) | ORAL | 3 refills | Status: DC
Start: 2020-06-11 — End: 2021-06-10

## 2020-06-11 MED ORDER — ATORVASTATIN CALCIUM 40 MG PO TABS
40.0000 mg | ORAL_TABLET | Freq: Every day | ORAL | 3 refills | Status: DC
Start: 2020-06-11 — End: 2021-06-10

## 2020-06-11 NOTE — Patient Instructions (Signed)
Medication Instructions:  Your physician recommends that you continue on your current medications as directed. Please refer to the Current Medication list given to you today.  *If you need a refill on your cardiac medications before your next appointment, please call your pharmacy*   Lab Work: TODAY: LIPIDS, LFTS BMET  If you have labs (blood work) drawn today and your tests are completely normal, you will receive your results only by: [name] MyChart Message (if you have MyChart) OR . A paper copy in the mail If you have any lab test that is abnormal or we need to change your treatment, we will call you to review the results.   Testing/Procedures: None   Follow-Up: At [facility], you and your health needs are our priority.  As part of our continuing mission to provide you with exceptional heart care, we have created designated Provider Care Teams.  These Care Teams include your primary Cardiologist (physician) and Advanced Practice Providers (APPs -  Physician Assistants and Nurse Practitioners) who all work together to provide you with the care you need, when you need it.  We recommend signing up for the patient portal called "MyChart".  Sign up information is provided on this After Visit Summary.  MyChart is used to connect with patients for Virtual Visits (Telemedicine).  Patients are able to view lab/test results, encounter notes, upcoming appointments, etc.  Non-urgent messages can be sent to your provider as well.   To learn more about what you can do with MyChart, go to [email or web address].    Your next appointment:   12 month(s)  The format for your next appointment:   In Person  Provider:   You may see [name], MD or one of the following Advanced Practice Providers on your designated Care Team:    Ronie Spies, PA-C  Jacolyn Reedy, PA-C    Other Instructions None

## 2020-06-11 NOTE — Progress Notes (Signed)
Cardiology Office Note   Date:  06/11/2020   ID:  EIAN, VANDERVELDEN [birth year], MRN [id number]  PCP:  Shelva Majestic, MD  Cardiologist:   [name], MD   Chief Complaint  Patient presents with  . Coronary Artery Disease   1. Coronary artery disease-status post PTCA and stenting of his left circumflex artery and right coronary artery 2. Hyperlipidemia     79 year old gentleman with a history of coronary artery disease. He status post PTCA and stenting of the left circumflex artery as well as PTCA of the posterior descending artery and January 2002. He's done very well. He has not had any episodes of chest pain or shortness of breath.  January 16, 2014:  Obediah feels well - no angina, no dyspnea. He is watching his diet   January 21, 2015:  Jaquavion Mccannon II is a 79 y.o. male who presents for follow-up of his coronary artery disease. Still bowling  [facility]  Does some cardio.   January 21, 2016:  Tanush is seen today for his CAD.    Has some muscle stiffness.  No muscle aches.  Has done some stretches.   January 27, 2017:  Having some muscle pull issus Having some ortho issues.  No CP  Is now changing Dr. [name] at [facility].  Has stopped his Niacin .   Still goes to the [facility].  Does light cardio and some machines.   March 17, 2018:  No CP , some dyspnea.   Especially with walking . Has some thigh pain with exercise.   Does not walk regularly  [facility] 6 [facility].   Rides a bike, walks on treadmill , works out on [employer] , seated ellipitical   Aug. 9, 2021: Jrake is seen for follow up of his CAD, HLD  Still very active. Bowls regularly  No CP , no dyspnea   Labs from Jan were reviewed  Wt today is 168.  Has lost weight on purpose - 25 lbs over the past 2 years.     Past Medical History:  Diagnosis Date  . Coronary artery disease    status post PTCA and stenting of his left circumflex artery and  right coronary artery  . Hyperlipidemia   . Reflux    omeprazole 40mg     Past Surgical History:  Procedure Laterality Date  . Benign Cyst     on back  . CARDIOVASCULAR STRESS TEST  05-09-2007   EF 64%  . carpal tunnel left    . CYSTECTOMY     removed from finger  . right knee surgery     arthroscopyc 02/2010     Current Outpatient Medications  Medication Sig Dispense Refill  . aspirin 81 MG tablet Take 1 tablet (81 mg total) by mouth daily.    03/2010 atorvastatin (LIPITOR) 40 MG tablet Take 1 tablet (40 mg total) by mouth daily. 90 tablet 3  . benzonatate (TESSALON) 100 MG capsule Take 1 capsule (100 mg total) by mouth 2 (two) times daily as needed for cough. 20 capsule 0  . Calcium Carbonate-Vitamin D (CALCIUM + D PO) Take 1 tablet by mouth daily.     . fluticasone (FLONASE) 50 MCG/ACT nasal spray Place 2 sprays into both nostrils daily. 48 g 1  . glucosamine-chondroitin 500-400 MG tablet Take 1 tablet by mouth 2 (two) times daily.    . Melatonin 10 MG TABS  Take 1 tablet by mouth daily.     . metoprolol tartrate (LOPRESSOR) 25 MG tablet Take 1 tablet (25 mg total) by mouth 2 (two) times daily. 180 tablet 3  . Multiple Vitamin (MULTIVITAMIN PO) Take 1 tablet by mouth daily.     . Omega-3 Fatty Acids (FISH OIL) 1200 MG CAPS Take 1,000 mg by mouth daily.     [name] omeprazole (PRILOSEC) 40 MG capsule TAKE 1 CAPSULE (40 MG TOTAL) BY MOUTH DAILY. 90 capsule 3  . tamsulosin (FLOMAX) 0.4 MG CAPS capsule Take 1 capsule (0.4 mg total) by mouth daily. 90 capsule 3  . traZODone (DESYREL) 50 MG tablet Take 0.5-1 tablets (25-50 mg total) by mouth at bedtime as needed for sleep. 90 tablet 3   No current facility-administered medications for this visit.    Allergies:   Simvastatin    Social History:  The patient  reports that he has never smoked. He has never used smokeless tobacco. He reports that he does not drink alcohol and does not use drugs.   Family History:  The patient's family history  includes Alcohol abuse in his father; Bladder Cancer in his mother; Heart attack in his father.    ROS: Noted in current history, otherwise review of systems is negative.    Physical Exam: Blood pressure 120/62, pulse 72, height 5\' 8"  (1.727 m), weight 168 lb (76.2 kg), SpO2 97 %.  GEN:  Well nourished, well developed in no acute distress HEENT: Normal NECK: No JVD; No carotid bruits LYMPHATICS: No lymphadenopathy CARDIAC: RRR , no murmurs, rubs, gallops RESPIRATORY:  Clear to auscultation without rales, wheezing or rhonchi  ABDOMEN: Soft, non-tender, non-distended MUSCULOSKELETAL:  No edema; No deformity  SKIN: Warm and dry NEUROLOGIC:  Alert and oriented x 3    EKG:    Sept. 8, 2021. NSR at 72,  No ST or T wave abnl.  Recent Labs: 10/30/2019: Hemoglobin 15.4; Platelets 228.0; TSH 0.59 06/11/2020: ALT 23; BUN 15; Creatinine, Ser 0.75; Potassium 4.2; Sodium 138    Lipid Panel    Component Value Date/Time   CHOL 108 06/11/2020 0948   TRIG 60 06/11/2020 0948   HDL 41 06/11/2020 0948   CHOLHDL 2.6 06/11/2020 0948   CHOLHDL 3 10/30/2019 1105   VLDL 14.2 10/30/2019 1105   LDLCALC 54 06/11/2020 0948   LDLDIRECT 74.0 09/07/2017 0904      Wt Readings from Last 3 Encounters:  06/11/20 168 lb (76.2 kg)  01/28/20 181 lb (82.1 kg)  01/28/20 181 lb (82.1 kg)      Other studies Reviewed: Additional studies/ records that were reviewed today include: . Review of the above records demonstrates:    ASSESSMENT AND PLAN:  1. Coronary artery disease-status post PTCA and stenting of his left circumflex artery and right coronary artery-   Ding well.  Cont current meds.   2. Hyperlipidemia -   Check labs today .   His labs in Jan. 2021 werer reveiewed and are stable   3. Leg pain.  -   Current medicines are reviewed at length with the patient today.  The patient does not have concerns regarding medicines.  The following changes have been made:  no change  Labs/ tests ordered  today include:   Orders Placed This Encounter  Procedures  . Lipid panel  . Hepatic function panel  . Basic metabolic panel  . EKG 12-Lead     Disposition:         Signed, 12-21[birth year]1992, MD  06/11/2020 6:16 PM    [facility] [address] [place], [place], [place]  [address] Phone: [phone]; Fax: [phone]

## 2020-06-16 DIAGNOSIS — R69 Illness, unspecified: Secondary | ICD-10-CM | POA: Diagnosis not present

## 2020-06-24 ENCOUNTER — Other Ambulatory Visit: Payer: Self-pay

## 2020-06-24 ENCOUNTER — Ambulatory Visit (INDEPENDENT_AMBULATORY_CARE_PROVIDER_SITE_OTHER): Payer: Medicare HMO

## 2020-06-24 DIAGNOSIS — Z23 Encounter for immunization: Secondary | ICD-10-CM | POA: Diagnosis not present

## 2020-10-02 ENCOUNTER — Telehealth (INDEPENDENT_AMBULATORY_CARE_PROVIDER_SITE_OTHER): Payer: Medicare HMO | Admitting: Family Medicine

## 2020-10-02 DIAGNOSIS — R0981 Nasal congestion: Secondary | ICD-10-CM

## 2020-10-02 DIAGNOSIS — R112 Nausea with vomiting, unspecified: Secondary | ICD-10-CM

## 2020-10-02 NOTE — Patient Instructions (Signed)
  HOME CARE TIPS:  -[place] COVID19 testing information: [email or web address] OR [phone] Most pharmacies also offer testing and home test kits.    -stay hydrated, drink plenty of fluids and eat small healthy meals - avoid dairy  -If the Covid test is positive, check out the [facility] website for more information on home care, transmission and treatment for COVID19 and follow up with your doctor  -follow up with your doctor in 2-3 days unless improving and feeling better  -stay home while sick, except to seek medical care, and if you have COVID19 please stay home for a full 10 days since the onset of symptoms PLUS one day of no fever and feeling better.  It was nice to meet you today, and I really hope you are feeling better soon. I help [facility] out with telemedicine visits on Tuesdays and Thursdays and am available for visits on those days. If you have any concerns or questions following this visit please schedule a follow up visit with your Primary Care doctor or seek care at a local urgent care clinic to avoid delays in care.    Seek in person care promptly if your symptoms worsen, new concerns arise or you are not improving with treatment. Call 911 and/or seek emergency care if you symptoms are severe or life threatening.

## 2020-10-02 NOTE — Progress Notes (Signed)
Virtual Visit via Video Note  I connected with [name] [name]  on 10/02/20 at  4:00 PM EST by a video enabled telemedicine application and verified that I am speaking with the correct person using two identifiers.  Location patient: home, [place] Location provider:work or home office Persons participating in the virtual visit: patient, provider  I discussed the limitations of evaluation and management by telemedicine and the availability of in person appointments. The patient expressed understanding and agreed to proceed.   HPI:  Acute telemedicine visit for feeling sick: -Onset: 09/28/20 -Symptoms include: upset stomach with vomiting the first night he was sick, the vomiting and nausea resolved after the 1st 24 hours. Still feels a little tired, has had some body aches, loss of taste, nasal congestion -having normal bowel movements  -Denies:fever, sob, CP, inability to get up eat/drink, persistent abd pain -Has tried: nothing -Pertinent past medical history:  See below -Pertinent medication allergies: simvastatin -COVID-19 vaccine status:fully vaccinated + booster, and had flu shot -reports called his office to schedule physical and they told him to do video visit for sick symptoms first and also to schedule a covid test  ROS: See pertinent positives and negatives per HPI.  Past Medical History:  Diagnosis Date  . Coronary artery disease    status post PTCA and stenting of his left circumflex artery and right coronary artery  . Hyperlipidemia   . Reflux    omeprazole 40mg     Past Surgical History:  Procedure Laterality Date  . Benign Cyst     on back  . CARDIOVASCULAR STRESS TEST  05-09-2007   EF 64%  . carpal tunnel left    . CYSTECTOMY     removed from finger  . right knee surgery     arthroscopyc 02/2010     Current Outpatient Medications:  .  aspirin 81 MG tablet, Take 1 tablet (81 mg total) by mouth daily., Disp: , Rfl:  .  atorvastatin (LIPITOR) 40 MG tablet, Take 1 tablet (40  mg total) by mouth daily., Disp: 90 tablet, Rfl: 3 .  benzonatate (TESSALON) 100 MG capsule, Take 1 capsule (100 mg total) by mouth 2 (two) times daily as needed for cough., Disp: 20 capsule, Rfl: 0 .  Calcium Carbonate-Vitamin D (CALCIUM + D PO), Take 1 tablet by mouth daily. , Disp: , Rfl:  .  fluticasone (FLONASE) 50 MCG/ACT nasal spray, Place 2 sprays into both nostrils daily., Disp: 48 g, Rfl: 1 .  glucosamine-chondroitin 500-400 MG tablet, Take 1 tablet by mouth 2 (two) times daily., Disp: , Rfl:  .  Melatonin 10 MG TABS, Take 1 tablet by mouth daily. , Disp: , Rfl:  .  metoprolol tartrate (LOPRESSOR) 25 MG tablet, Take 1 tablet (25 mg total) by mouth 2 (two) times daily., Disp: 180 tablet, Rfl: 3 .  Multiple Vitamin (MULTIVITAMIN PO), Take 1 tablet by mouth daily. , Disp: , Rfl:  .  Omega-3 Fatty Acids (FISH OIL) 1200 MG CAPS, Take 1,000 mg by mouth daily. , Disp: , Rfl:  .  omeprazole (PRILOSEC) 40 MG capsule, TAKE 1 CAPSULE (40 MG TOTAL) BY MOUTH DAILY., Disp: 90 capsule, Rfl: 3 .  tamsulosin (FLOMAX) 0.4 MG CAPS capsule, Take 1 capsule (0.4 mg total) by mouth daily., Disp: 90 capsule, Rfl: 3 .  traZODone (DESYREL) 50 MG tablet, Take 0.5-1 tablets (25-50 mg total) by mouth at bedtime as needed for sleep., Disp: 90 tablet, Rfl: 3  EXAM:  VITALS per patient if applicable:  GENERAL:  alert, oriented, appears well and in no acute distress  HEENT: atraumatic, conjunttiva clear, no obvious abnormalities on inspection of external nose and ears  NECK: normal movements of the head and neck  LUNGS: on inspection no signs of respiratory distress, breathing rate appears normal, no obvious gross SOB, gasping or wheezing  CV: no obvious cyanosis  MS: moves all visible extremities without noticeable abnormality  PSYCH/NEURO: pleasant and cooperative, no obvious depression or anxiety, speech and thought processing grossly intact  ASSESSMENT AND PLAN:  Discussed the following assessment and  plan:  Nausea and vomiting, intractability of vomiting not specified, unspecified vomiting type  Nasal congestion  -we discussed possible serious and likely etiologies, options for evaluation and workup, limitations of telemedicine visit vs in person visit, treatment, treatment risks and precautions. Pt prefers to treat via telemedicine empirically rather than in person at this moment.  Query viral illness, possible Covid, versus other.  He seems to be improving at this point.  He opted for fluids, avoidance of dairy until feeling better and getting Covid tested.  Discussed options for Covid test, treatment, potential complications, isolation and precautions. Advised to seek prompt in person care if worsening, new symptoms arise, or if is not improving with treatment. Discussed options for inperson care if PCP office not available. Did let this patient know that I only do telemedicine on Tuesdays and Thursdays for [place]. Advised to schedule follow up visit with PCP or [facility] if any further questions or concerns to avoid delays in care.   I discussed the assessment and treatment plan with the patient. The patient was provided an opportunity to ask questions and all were answered. The patient agreed with the plan and demonstrated an understanding of the instructions.     Terressa Koyanagi, DO

## 2020-10-06 ENCOUNTER — Other Ambulatory Visit: Payer: Medicare HMO

## 2020-10-06 DIAGNOSIS — Z20822 Contact with and (suspected) exposure to covid-19: Secondary | ICD-10-CM

## 2020-10-07 LAB — SARS-COV-2, NAA 2 DAY TAT

## 2020-10-07 LAB — NOVEL CORONAVIRUS, NAA: SARS-CoV-2, NAA: NOT DETECTED

## 2020-10-23 DIAGNOSIS — H524 Presbyopia: Secondary | ICD-10-CM | POA: Diagnosis not present

## 2020-12-01 ENCOUNTER — Telehealth: Payer: Self-pay

## 2020-12-01 ENCOUNTER — Telehealth: Payer: Self-pay | Admitting: *Deleted

## 2020-12-01 ENCOUNTER — Ambulatory Visit (INDEPENDENT_AMBULATORY_CARE_PROVIDER_SITE_OTHER): Payer: Medicare HMO | Admitting: Physician Assistant

## 2020-12-01 ENCOUNTER — Encounter: Payer: Self-pay | Admitting: Physician Assistant

## 2020-12-01 ENCOUNTER — Ambulatory Visit (HOSPITAL_BASED_OUTPATIENT_CLINIC_OR_DEPARTMENT_OTHER)
Admission: RE | Admit: 2020-12-01 | Discharge: 2020-12-01 | Disposition: A | Discharge status: Home / Self Care | Payer: Medicare HMO | Source: Ambulatory Visit | Attending: Physician Assistant | Admitting: Physician Assistant

## 2020-12-01 ENCOUNTER — Other Ambulatory Visit: Payer: Self-pay

## 2020-12-01 ENCOUNTER — Other Ambulatory Visit: Payer: Self-pay | Admitting: Physician Assistant

## 2020-12-01 VITALS — BP 120/74 | HR 82 | Temp 97.3°F | Ht 68.0 in | Wt 168.0 lb

## 2020-12-01 DIAGNOSIS — R1013 Epigastric pain: Secondary | ICD-10-CM

## 2020-12-01 DIAGNOSIS — R109 Unspecified abdominal pain: Secondary | ICD-10-CM | POA: Diagnosis not present

## 2020-12-01 DIAGNOSIS — R932 Abnormal findings on diagnostic imaging of liver and biliary tract: Secondary | ICD-10-CM

## 2020-12-01 LAB — COMPREHENSIVE METABOLIC PANEL
ALT: 181 U/L — ABNORMAL HIGH (ref 0–53)
AST: 56 U/L — ABNORMAL HIGH (ref 0–37)
Albumin: 4.2 g/dL (ref 3.5–5.2)
Alkaline Phosphatase: 167 U/L — ABNORMAL HIGH (ref 39–117)
BUN: 22 mg/dL (ref 6–23)
CO2: 28 mEq/L (ref 19–32)
Calcium: 9.7 mg/dL (ref 8.4–10.5)
Chloride: 102 mEq/L (ref 96–112)
Creatinine, Ser: 0.7 mg/dL (ref 0.40–1.50)
GFR: 88.9 mL/min (ref >60.00)
Glucose, Bld: 83 mg/dL (ref 70–99)
Potassium: 4.3 mEq/L (ref 3.5–5.1)
Sodium: 138 mEq/L (ref 135–145)
Total Bilirubin: 0.8 mg/dL (ref 0.2–1.2)
Total Protein: 7.3 g/dL (ref 6.0–8.3)

## 2020-12-01 LAB — POC URINALSYSI DIPSTICK (AUTOMATED)
Bilirubin, UA: NEGATIVE
Blood, UA: NEGATIVE
Glucose, UA: NEGATIVE
Ketones, UA: NEGATIVE
Leukocytes, UA: NEGATIVE
Nitrite, UA: NEGATIVE
Protein, UA: NEGATIVE
Spec Grav, UA: >=1.03 — AB (ref 1.010–1.025)
Urobilinogen, UA: 0.2 E.U./dL
pH, UA: 5 (ref 5.0–8.0)

## 2020-12-01 LAB — CBC WITH DIFFERENTIAL/PLATELET
Basophils Absolute: 0 10*3/uL (ref 0.0–0.1)
Basophils Relative: 0.7 % (ref 0.0–3.0)
Eosinophils Absolute: 0 10*3/uL (ref 0.0–0.7)
Eosinophils Relative: 0.7 % (ref 0.0–5.0)
HCT: 42.7 % (ref 39.0–52.0)
Hemoglobin: 14.7 g/dL (ref 13.0–17.0)
Lymphocytes Relative: 28.9 % (ref 12.0–46.0)
Lymphs Abs: 1.4 10*3/uL (ref 0.7–4.0)
MCHC: 34.5 g/dL (ref 30.0–36.0)
MCV: 88.9 fl (ref 78.0–100.0)
Monocytes Absolute: 0.5 10*3/uL (ref 0.1–1.0)
Monocytes Relative: 11.3 % (ref 3.0–12.0)
Neutro Abs: 2.8 10*3/uL (ref 1.4–7.7)
Neutrophils Relative %: 58.4 % (ref 43.0–77.0)
Platelets: 224 10*3/uL (ref 150.0–400.0)
RBC: 4.81 Mil/uL (ref 4.22–5.81)
RDW: 13.8 % (ref 11.5–15.5)
WBC: 4.8 10*3/uL (ref 4.0–10.5)

## 2020-12-01 LAB — LIPASE: Lipase: 23 U/L (ref 11.0–59.0)

## 2020-12-01 MED ORDER — PANTOPRAZOLE SODIUM 40 MG PO TBEC: 40.0000 mg | DELAYED_RELEASE_TABLET | Freq: Every day | ORAL | 3 refills | Status: DC

## 2020-12-01 NOTE — Telephone Encounter (Signed)
Received call from [facility] with Imaging preliminary report shows: Heterogeneous mass within gallbladder lumen vs asymmetric wall thickening. Consideration includes polyp, tumefactive sludge or Gallbladder carcinoma. Recommend future evaluation MRI of abdomen with and without contrast and MRCP.

## 2020-12-01 NOTE — Telephone Encounter (Signed)
I called and spoke with the radiologist from [facility] to clarify the order.  Dr. Manson Passey confirmed that we should order MRCP with MRI with and without contrast for further evaluation.  Order placed and Berneta Levins (doing our stat referrals today) was notified.  Further referral based on results of imaging.

## 2020-12-01 NOTE — Progress Notes (Signed)
[name] [name] is a 80 y.o. male here for a new problem.  I acted as a Neurosurgeon for [employer], PA-C [name] Mull, LPN   History of Present Illness:   Chief Complaint  Patient presents with  . Abdominal Pain    HPI   Abdominal pain Pt c/o mid upper abdominal pain episode Sunday after Thanksgiving, again after Christmas. Pt has had a few other episodes since then, but had another episode this past Saturday that lasted about 45 minutes. On Saturday, took 4 ibuprofen and stomach medication. Denies regular use of ibuprofen The first two episodes lasted "all night." Each episode has caused him to be doubled over in pain. He thought about going to ER but didn\'t. States pain 10/10.  Symptoms are not influenced by oral intake.  Hx of dysphagia, requiring esophageal dilatation. Last done in 2011. Record in Epic. Had 10 mm gastric polyp that was biopsied at that time and was normal based on his recollection. Has had GERD since the 90\'s and takes 40 mg omeprazole daily.   Last colonoscopy performed 02/12/20 -- I do not have these records.  Denies nausea, vomiting or diarrhea, gross rectal bleeding, black stools, change in BM, no hx of kidney stones, no changes in urination, chest pain, SOB, LE edema. Does have history of constipation but not significant. His weight has been stable since Sep, but looks like he lost about  13 lb over last summer. He does report that he has started working out more since that time. Does not drink ETOH.   Wt Readings from Last 4 Encounters:  12/01/20 168 lb (76.2 kg)  06/11/20 168 lb (76.2 kg)  01/28/20 181 lb (82.1 kg)  01/28/20 181 lb (82.1 kg)      Past Medical History:  Diagnosis Date  . Coronary artery disease    status post PTCA and stenting of his left circumflex artery and right coronary artery  . Hyperlipidemia   . Reflux    omeprazole 40mg     Social History   Tobacco Use  . Smoking status: Never Smoker  . Smokeless tobacco: Never  Used  Vaping Use  . Vaping Use: Never used  Substance Use Topics  . Alcohol use: No  . Drug use: No    Past Surgical History:  Procedure Laterality Date  . Benign Cyst     on back  . CARDIOVASCULAR STRESS TEST  05-09-2007   EF 64%  . carpal tunnel left    . CYSTECTOMY     removed from finger  . right knee surgery     arthroscopyc 02/2010    Family History  Problem Relation Age of Onset  . Bladder Cancer Mother        passed age 50.   . Heart attack Father        62  passed from this  . Alcohol abuse Father     Allergies  Allergen Reactions  . Simvastatin Other (See Comments)    Leg pain     Current Medications:   Current Outpatient Medications:  .  aspirin 81 MG tablet, Take 1 tablet (81 mg total) by mouth daily., Disp: , Rfl:  .  atorvastatin (LIPITOR) 40 MG tablet, Take 1 tablet (40 mg total) by mouth daily., Disp: 90 tablet, Rfl: 3 .  Calcium Carbonate-Vitamin D (CALCIUM + D PO), Take 1 tablet by mouth daily., Disp: , Rfl:  .  fluticasone (FLONASE) 50 MCG/ACT nasal spray, Place 2 sprays into both nostrils  daily., Disp: 48 g, Rfl: 1 .  glucosamine-chondroitin 500-400 MG tablet, Take 1 tablet by mouth 2 (two) times daily., Disp: , Rfl:  .  Melatonin 10 MG TABS, Take 1 tablet by mouth daily. , Disp: , Rfl:  .  metoprolol tartrate (LOPRESSOR) 25 MG tablet, Take 1 tablet (25 mg total) by mouth 2 (two) times daily., Disp: 180 tablet, Rfl: 3 .  Multiple Vitamin (MULTIVITAMIN PO), Take 1 tablet by mouth daily., Disp: , Rfl:  .  Omega-3 Fatty Acids (FISH OIL) 1200 MG CAPS, Take 1,000 mg by mouth daily., Disp: , Rfl:  .  pantoprazole (PROTONIX) 40 MG tablet, Take 1 tablet (40 mg total) by mouth daily., Disp: 30 tablet, Rfl: 3 .  tamsulosin (FLOMAX) 0.4 MG CAPS capsule, Take 1 capsule (0.4 mg total) by mouth daily., Disp: 90 capsule, Rfl: 3 .  traZODone (DESYREL) 50 MG tablet, Take 0.5-1 tablets (25-50 mg total) by mouth at bedtime as needed for sleep., Disp: 90 tablet, Rfl: 3    Review of Systems:   ROS Negative unless otherwise specified per HPI.  Vitals:   Vitals:   12/01/20 1301  BP: 120/74  Pulse: 82  Temp: (!) 97.3 F (36.3 C)  TempSrc: Temporal  SpO2: 97%  Weight: 168 lb (76.2 kg)  Height: 5\' 8"  (1.727 m)     Body mass index is 25.54 kg/m.  Physical Exam:   Physical Exam Vitals and nursing note reviewed.  Constitutional:      General: He is not in acute distress.    Appearance: He is well-developed. He is not ill-appearing, toxic-appearing or sickly-appearing.  Cardiovascular:     Rate and Rhythm: Normal rate and regular rhythm.     Pulses: Normal pulses.     Heart sounds: Normal heart sounds, S1 normal and S2 normal.     Comments: No LE edema Pulmonary:     Effort: Pulmonary effort is normal.     Breath sounds: Normal breath sounds.  Abdominal:     General: Abdomen is flat. Bowel sounds are normal.     Palpations: Abdomen is soft.     Tenderness: There is abdominal tenderness in the right upper quadrant and epigastric area.  Skin:    General: Skin is warm, dry and intact.  Neurological:     Mental Status: He is alert.     GCS: GCS eye subscore is 4. GCS verbal subscore is 5. GCS motor subscore is 6.  Psychiatric:        Mood and Affect: Mood and affect normal.        Speech: Speech normal.        Behavior: Behavior normal. Behavior is cooperative.     Results for orders placed or performed in visit on 12/01/20  POCT Urinalysis Dipstick (Automated)  Result Value Ref Range   Color, UA yellow    Clarity, UA clear    Glucose, UA Negative Negative   Bilirubin, UA negative    Ketones, UA negative    Spec Grav, UA >=1.030 (A) 1.010 - 1.025   Blood, UA negative    pH, UA 5.0 5.0 - 8.0   Protein, UA Negative Negative   Urobilinogen, UA 0.2 0.2 or 1.0 E.U./dL   Nitrite, UA negative    Leukocytes, UA Negative Negative    Assessment and Plan:   [name] [name] was seen today for abdominal pain.  Diagnoses and all orders for this  visit:  Epigastric abdominal pain Unclear etiology. Urine test negative. DDx includes,  but is not limited to: gastritis, PUD, pancreatitis, gallbladder etiology, constipation, among others. Due to tenderness on exam and symptoms, will order u/s to further work-up. I did recommend that he go ahead and follow-up with Dr. Haywood Pao office as well to follow-up on this. If any worsening symptoms, needs to go to ER urgently. -     CBC with Differential/Platelet -     Comprehensive metabolic panel -     POCT Urinalysis Dipstick (Automated) -     Lipase -     H. pylori antibody, IgG -     US ABDOMEN LIMITED RUQ (LIVER/GB); Future  Other orders -     pantoprazole (PROTONIX) 40 MG tablet; Take 1 tablet (40 mg total) by mouth daily.  CMA or LPN served as scribe during this visit. History, Physical, and Plan performed by medical provider. The above documentation has been reviewed and is accurate and complete.  Jarold Motto, PA-C

## 2020-12-01 NOTE — Telephone Encounter (Signed)
[name] [name], pt is agreeable and will be available tomorrow afternoon.

## 2020-12-01 NOTE — Telephone Encounter (Signed)
Spoke to pt told him per Lelon Mast said, that ultrasound is concerning for a mass -- hard to say exactly what is going on but we need more imaging as soon as possible. Pt verbalized understanding. She said she  recommends stat MRI for further evaluation. Please let me know if agreeable and we will order for tomorrow. Pt verbalized understanding and said he is agreeable will be available tomorrow afternoon. Told him someone will be in touch with him to schedule.

## 2020-12-01 NOTE — Telephone Encounter (Signed)
Lelon Mast, please see preliminary report.

## 2020-12-01 NOTE — Patient Instructions (Signed)
It was great to see you!  Stop prilosec. Start protonix instead. I have sent this in through [facility].  Abdominal ultrasound today.  I would recommend going ahead and calling Dr. Haywood Pao office to schedule follow-up from today's visit.  I will be in touch with ultrasound and lab results.  If any worsening symptoms in the meantime -- please go to the ER.  Abdominal Pain, Adult Many things can cause belly (abdominal) pain. Most times, belly pain is not dangerous. Many cases of belly pain can be watched and treated at home. Sometimes, though, belly pain is serious. Your doctor will try to find the cause of your belly pain. Follow these instructions at home: Medicines  Take over-the-counter and prescription medicines only as told by your doctor.  Do not take medicines that help you poop (laxatives) unless told by your doctor. General instructions  Watch your belly pain for any changes.  Drink enough fluid to keep your pee (urine) pale yellow.  Keep all follow-up visits as told by your doctor. This is important.   Contact a doctor if:  Your belly pain changes or gets worse.  You are not hungry, or you lose weight without trying.  You are having trouble pooping (constipated) or have watery poop (diarrhea) for more than 2-3 days.  You have pain when you pee or poop.  Your belly pain wakes you up at night.  Your pain gets worse with meals, after eating, or with certain foods.  You are vomiting and cannot keep anything down.  You have a fever.  You have blood in your pee. Get help right away if:  Your pain does not go away as soon as your doctor says it should.  You cannot stop vomiting.  Your pain is only in areas of your belly, such as the right side or the left lower part of the belly.  You have bloody or black poop, or poop that looks like tar.  You have very bad pain, cramping, or bloating in your belly.  You have signs of not having enough fluid or water in your  body (dehydration), such as: ? Dark pee, very little pee, or no pee. ? Cracked lips. ? Dry mouth. ? Sunken eyes. ? Sleepiness. ? Weakness.  You have trouble breathing or chest pain. Summary  Many cases of belly pain can be watched and treated at home.  Watch your belly pain for any changes.  Take over-the-counter and prescription medicines only as told by your doctor.  Contact a doctor if your belly pain changes or gets worse.  Get help right away if you have very bad pain, cramping, or bloating in your belly. This information is not intended to replace advice given to you by your health care provider. Make sure you discuss any questions you have with your health care provider. Document Revised: 01/29/2019 Document Reviewed: 01/29/2019 Elsevier Patient Education  2021 [employer].

## 2020-12-01 NOTE — Telephone Encounter (Signed)
Please call patient and let him know that ultrasound is concerning for a mass -- hard to say exactly what is going on but we need more imaging as soon as possible.  I recommend stat MRI for further evaluation. Please let me know if agreeable and we will order for tomorrow (I think he has an appointment tomorrow -- please confirm his availability)

## 2020-12-01 NOTE — Telephone Encounter (Signed)
10.8.21 Pt had pfizer covid booster

## 2020-12-01 NOTE — Telephone Encounter (Signed)
pantoprazole (PROTONIX) 40 MG tablet  Pt is requesting this medication be sent in as a 90 day supply

## 2020-12-02 ENCOUNTER — Other Ambulatory Visit: Payer: Self-pay | Admitting: Physician Assistant

## 2020-12-02 ENCOUNTER — Ambulatory Visit (HOSPITAL_COMMUNITY)
Admission: RE | Admit: 2020-12-02 | Discharge: 2020-12-02 | Disposition: A | Discharge status: Home / Self Care | Payer: Medicare HMO | Source: Ambulatory Visit | Attending: Physician Assistant | Admitting: Physician Assistant

## 2020-12-02 DIAGNOSIS — R932 Abnormal findings on diagnostic imaging of liver and biliary tract: Secondary | ICD-10-CM

## 2020-12-02 DIAGNOSIS — H2513 Age-related nuclear cataract, bilateral: Secondary | ICD-10-CM | POA: Diagnosis not present

## 2020-12-02 DIAGNOSIS — H25013 Cortical age-related cataract, bilateral: Secondary | ICD-10-CM | POA: Diagnosis not present

## 2020-12-02 DIAGNOSIS — N281 Cyst of kidney, acquired: Secondary | ICD-10-CM | POA: Diagnosis not present

## 2020-12-02 DIAGNOSIS — H18413 Arcus senilis, bilateral: Secondary | ICD-10-CM | POA: Diagnosis not present

## 2020-12-02 DIAGNOSIS — H25043 Posterior subcapsular polar age-related cataract, bilateral: Secondary | ICD-10-CM | POA: Diagnosis not present

## 2020-12-02 DIAGNOSIS — H40013 Open angle with borderline findings, low risk, bilateral: Secondary | ICD-10-CM | POA: Diagnosis not present

## 2020-12-02 DIAGNOSIS — K8020 Calculus of gallbladder without cholecystitis without obstruction: Secondary | ICD-10-CM | POA: Diagnosis not present

## 2020-12-02 DIAGNOSIS — R1013 Epigastric pain: Secondary | ICD-10-CM

## 2020-12-02 DIAGNOSIS — K3189 Other diseases of stomach and duodenum: Secondary | ICD-10-CM | POA: Diagnosis not present

## 2020-12-02 DIAGNOSIS — H2511 Age-related nuclear cataract, right eye: Secondary | ICD-10-CM | POA: Diagnosis not present

## 2020-12-02 DIAGNOSIS — K2970 Gastritis, unspecified, without bleeding: Secondary | ICD-10-CM | POA: Diagnosis not present

## 2020-12-02 LAB — H. PYLORI ANTIBODY, IGG: H Pylori IgG: NEGATIVE

## 2020-12-02 MED ORDER — GADOBUTROL 1 MMOL/ML IV SOLN
7.5000 mL | Freq: Once | INTRAVENOUS | Status: AC | PRN
  Administered 2020-12-02: 7.5 mL via INTRAVENOUS

## 2020-12-03 ENCOUNTER — Other Ambulatory Visit: Payer: Self-pay

## 2020-12-03 DIAGNOSIS — K219 Gastro-esophageal reflux disease without esophagitis: Secondary | ICD-10-CM | POA: Diagnosis not present

## 2020-12-03 DIAGNOSIS — K8020 Calculus of gallbladder without cholecystitis without obstruction: Secondary | ICD-10-CM | POA: Diagnosis not present

## 2020-12-03 DIAGNOSIS — R932 Abnormal findings on diagnostic imaging of liver and biliary tract: Secondary | ICD-10-CM | POA: Diagnosis not present

## 2020-12-03 NOTE — Telephone Encounter (Signed)
Documented immunization

## 2020-12-03 NOTE — Telephone Encounter (Signed)
Medication was last refilled 12/01/2020

## 2020-12-31 ENCOUNTER — Ambulatory Visit: Payer: Self-pay | Admitting: Surgery

## 2020-12-31 DIAGNOSIS — K8010 Calculus of gallbladder with chronic cholecystitis without obstruction: Secondary | ICD-10-CM | POA: Diagnosis not present

## 2021-01-06 ENCOUNTER — Encounter (HOSPITAL_COMMUNITY): Payer: Self-pay

## 2021-01-06 NOTE — Patient Instructions (Addendum)
DUE TO COVID-19 ONLY ONE VISITOR IS ALLOWED TO COME WITH YOU AND STAY IN THE WAITING ROOM ONLY DURING PRE OP AND PROCEDURE.   **NO VISITORS ARE ALLOWED IN THE SHORT STAY AREA OR RECOVERY ROOM!!**  IF YOU WILL BE ADMITTED INTO THE HOSPITAL YOU ARE ALLOWED ONLY TWO SUPPORT PEOPLE DURING VISITATION HOURS ONLY (10AM -8PM)   . The support person(s) may change daily. . The support person(s) must pass our screening, gel in and out, and wear a mask at all times, including in the patient's room. . Patients must also wear a mask when staff or their support person are in the room.  No visitors under the age of 48. Any visitor under the age of 57 must be accompanied by an adult.        Your procedure is scheduled on: Monday, 01-12-21   Report to [facility] Main  Entrance    Report to admitting at 11:00 AM   Call this number if you have problems the morning of surgery [phone]   Do not eat food :After Midnight.   May have liquids until 10:00 AM  day of surgery  CLEAR LIQUID DIET  Foods Allowed                                                                     Foods Excluded  Water, Black Coffee and tea, regular and decaf              liquids that you cannot  Plain Jell-O in any flavor  (No red)                                    see through such as: Fruit ices (not with fruit pulp)                                      milk, soups, orange juice              Iced Popsicles (No red)                                      All solid food                                   Apple juices Sports drinks like Gatorade (No red) Lightly seasoned clear broth or consume(fat free) Sugar, honey syrup  Oral Hygiene is also important to reduce your risk of infection.                                    Remember - BRUSH YOUR TEETH THE MORNING OF SURGERY WITH YOUR REGULAR TOOTHPASTE   Do NOT smoke after Midnight   Take these medicines the morning of surgery with A SIP OF WATER:  Atorvastatin,  Metoprolol, Omeprazole  You may not have any metal on your body including jewelry, and body piercings             Do not wear lotions, powders, cologne, or deodorant             Men may shave face and neck.   Do not bring valuables to the hospital. [place] IS NOT             RESPONSIBLE   FOR VALUABLES.   Contacts, dentures or bridgework may not be worn into surgery.   Bring small overnight bag day of surgery.                 Please read over the following fact sheets you were given: IF YOU HAVE QUESTIONS ABOUT YOUR PRE OP INSTRUCTIONS PLEASE CALL  [phone] [facility] - Preparing for Surgery Before surgery, you can play an important role.  Because skin is not sterile, your skin needs to be as free of germs as possible.  You can reduce the number of germs on your skin by washing with CHG (chlorahexidine gluconate) soap before surgery.  CHG is an antiseptic cleaner which kills germs and bonds with the skin to continue killing germs even after washing. Please DO NOT use if you have an allergy to CHG or antibacterial soaps.  If your skin becomes reddened/irritated stop using the CHG and inform your nurse when you arrive at Short Stay. Do not shave (including legs and underarms) for at least 48 hours prior to the first CHG shower.  You may shave your face/neck.  Please follow these instructions carefully:  1.  Shower with CHG Soap the night before surgery and the  morning of surgery.  2.  If you choose to wash your hair, wash your hair first as usual with your normal  shampoo.  3.  After you shampoo, rinse your hair and body thoroughly to remove the shampoo.                             4.  Use CHG as you would any other liquid soap.  You can apply chg directly to the skin and wash.  Gently with a scrungie or clean washcloth.  5.  Apply the CHG Soap to your body ONLY FROM THE NECK DOWN.   Do   not use on face/ open                           Wound or  open sores. Avoid contact with eyes, ears mouth and   genitals (private parts).                       Wash face,  Genitals (private parts) with your normal soap.             6.  Wash thoroughly, paying special attention to the area where your    surgery  will be performed.  7.  Thoroughly rinse your body with warm water from the neck down.  8.  DO NOT shower/wash with your normal soap after using and rinsing off the CHG Soap.                9.  Pat yourself dry with a clean towel.            10.  Wear clean pajamas.  11.  Place clean sheets on your bed the night of your first shower and do not  sleep with pets. Day of Surgery : Do not apply any lotions/deodorants the morning of surgery.  Please wear clean clothes to the hospital/surgery center.  FAILURE TO FOLLOW THESE INSTRUCTIONS MAY RESULT IN THE CANCELLATION OF YOUR SURGERY  PATIENT SIGNATURE_________________________________  NURSE SIGNATURE__________________________________  ________________________________________________________________________

## 2021-01-06 NOTE — Progress Notes (Addendum)
COVID Vaccine Completed: x3 Date COVID Vaccine completed: 10-15-19 11-04-19 Has received booster:  07-11-20 COVID vaccine manufacturer: Pfizer      Date of COVID positive in last 47 days:N/A  PCP - [name], MD Cardiologist - [name], MD  Chest x-ray -N/A EKG - 06-11-20 Epic Stress Test - 2008 Epic ECHO -  Cardiac Cath -  Pacemaker/ICD device last checked: Spinal Cord Stimulator:  Sleep Study - N/A CPAP -   Fasting Blood Sugar - N/A Checks Blood Sugar _____ times a day  Blood Thinner Instructions: Aspirin Instructions:  ASA 81 mg Last Dose:  01-08-21  Activity level:  Can go up a flight of stairs and perform activities of daily living without stopping and without symptoms of chest pain or shortness of breath.  Able to exercise without symptoms  Anesthesia review:  CAD, hx of cardiac stent,  Patient denies shortness of breath, fever, cough and chest pain at PAT appointment   Patient verbalized understanding of instructions that were given to them at the PAT appointment. Patient was also instructed that they will need to review over the PAT instructions again at home before surgery.

## 2021-01-09 ENCOUNTER — Other Ambulatory Visit (HOSPITAL_COMMUNITY)
Admission: RE | Admit: 2021-01-09 | Discharge: 2021-01-09 | Disposition: A | Discharge status: Home / Self Care | Payer: Medicare HMO | Source: Ambulatory Visit | Attending: Surgery | Admitting: Surgery

## 2021-01-09 ENCOUNTER — Encounter (HOSPITAL_COMMUNITY)
Admission: RE | Admit: 2021-01-09 | Discharge: 2021-01-09 | Disposition: A | Discharge status: Home / Self Care | Payer: Medicare HMO | Source: Ambulatory Visit | Attending: Surgery | Admitting: Surgery

## 2021-01-09 ENCOUNTER — Encounter (HOSPITAL_COMMUNITY): Payer: Self-pay

## 2021-01-09 ENCOUNTER — Other Ambulatory Visit: Payer: Self-pay

## 2021-01-09 DIAGNOSIS — K219 Gastro-esophageal reflux disease without esophagitis: Secondary | ICD-10-CM | POA: Diagnosis not present

## 2021-01-09 DIAGNOSIS — I10 Essential (primary) hypertension: Secondary | ICD-10-CM | POA: Insufficient documentation

## 2021-01-09 DIAGNOSIS — Z20822 Contact with and (suspected) exposure to covid-19: Secondary | ICD-10-CM | POA: Diagnosis not present

## 2021-01-09 DIAGNOSIS — I2510 Atherosclerotic heart disease of native coronary artery without angina pectoris: Secondary | ICD-10-CM | POA: Diagnosis not present

## 2021-01-09 DIAGNOSIS — Z79899 Other long term (current) drug therapy: Secondary | ICD-10-CM | POA: Diagnosis not present

## 2021-01-09 DIAGNOSIS — Z7901 Long term (current) use of anticoagulants: Secondary | ICD-10-CM | POA: Diagnosis not present

## 2021-01-09 DIAGNOSIS — Z7982 Long term (current) use of aspirin: Secondary | ICD-10-CM | POA: Insufficient documentation

## 2021-01-09 DIAGNOSIS — K8010 Calculus of gallbladder with chronic cholecystitis without obstruction: Secondary | ICD-10-CM | POA: Insufficient documentation

## 2021-01-09 DIAGNOSIS — Z01812 Encounter for preprocedural laboratory examination: Secondary | ICD-10-CM | POA: Insufficient documentation

## 2021-01-09 HISTORY — DX: Gastro-esophageal reflux disease without esophagitis: K21.9

## 2021-01-09 HISTORY — DX: Essential (primary) hypertension: I10

## 2021-01-09 HISTORY — DX: Anxiety disorder, unspecified: F41.9

## 2021-01-09 HISTORY — DX: Presence of coronary angioplasty implant and graft: Z95.5

## 2021-01-09 LAB — CBC
HCT: 44.8 % (ref 39.0–52.0)
Hemoglobin: 15.1 g/dL (ref 13.0–17.0)
MCH: 30.3 pg (ref 26.0–34.0)
MCHC: 33.7 g/dL (ref 30.0–36.0)
MCV: 90 fL (ref 80.0–100.0)
Platelets: 212 10*3/uL (ref 150–400)
RBC: 4.98 MIL/uL (ref 4.22–5.81)
RDW: 13.1 % (ref 11.5–15.5)
WBC: 5.4 10*3/uL (ref 4.0–10.5)
nRBC: 0 % (ref 0.0–0.2)

## 2021-01-09 LAB — BASIC METABOLIC PANEL
Anion gap: 6 (ref 5–15)
BUN: 19 mg/dL (ref 8–23)
CO2: 27 mmol/L (ref 22–32)
Calcium: 9.5 mg/dL (ref 8.9–10.3)
Chloride: 105 mmol/L (ref 98–111)
Creatinine, Ser: 0.72 mg/dL (ref 0.61–1.24)
GFR, Estimated: >60 mL/min (ref >60)
Glucose, Bld: 97 mg/dL (ref 70–99)
Potassium: 4.5 mmol/L (ref 3.5–5.1)
Sodium: 138 mmol/L (ref 135–145)

## 2021-01-09 NOTE — Progress Notes (Signed)
Anesthesia Chart Review   Case: [id number] Date/Time: 01/12/21 1245   Procedure: LAPAROSCOPIC CHOLECYSTECTOMY WITH INTRAOPERATIVE CHOLANGIOGRAM (N/A ) - 90MIN/RM2   Anesthesia type: General   Pre-op diagnosis: CHRONIC CHOLECYSTITIS, CHOLELITHIASIS   Location: [facility] ROOM 02 / [facility]   Surgeons: Darnell Level, MD      DISCUSSION:80 y.o. never smoker with h/o HTN, GERD, CAD (Stent 2002), chronic cholecystitis scheduled for above procedure 01/12/21 with Dr. Darnell Level.   Pt last seen by cardiology 06/11/2020. Per OV note stable, no cv sx, very active.   Anticipate pt can proceed with planned procedure barring acute status change.    VS: BP 116/80   Pulse 67   Temp 36.6 C (Oral)   Resp 16   Ht 5\' 8"  (1.727 m)   Wt 72.7 kg   SpO2 100%   BMI 24.36 kg/m   PROVIDERS: , MD is PCP   Shelva Majestic, MD is Cardiologist  LABS: Labs reviewed: Acceptable for surgery. (all labs ordered are listed, but only abnormal results are displayed)  Labs Reviewed  BASIC METABOLIC PANEL  CBC     IMAGES:   EKG: 06/11/2020 Rate 72 bpm  NSR  CV:  Past Medical History:  Diagnosis Date  . Anxiety   . Coronary artery disease    status post PTCA and stenting of his left circumflex artery and right coronary artery  . GERD (gastroesophageal reflux disease)   . H/O heart artery stent   . Hyperlipidemia   . Hypertension    Pt denies  . Reflux    omeprazole 40mg     Past Surgical History:  Procedure Laterality Date  . Benign Cyst     on back  . CARDIOVASCULAR STRESS TEST  05-09-2007   EF 64%  . carpal tunnel left    . CORONARY ANGIOPLASTY WITH STENT PLACEMENT    . CYSTECTOMY     removed from finger  . cystoscopy    . LIPOMA EXCISION     Back  . right knee surgery     arthroscopyc 02/2010    MEDICATIONS: . aspirin 81 MG tablet  . atorvastatin (LIPITOR) 40 MG tablet  . Calcium Carbonate-Vitamin D (CALCIUM + D PO)  . fluticasone (FLONASE) 50 MCG/ACT nasal spray  .  glucosamine-chondroitin 500-400 MG tablet  . melatonin 5 MG TABS  . metoprolol tartrate (LOPRESSOR) 25 MG tablet  . Multiple Vitamin (MULTIVITAMIN PO)  . Omega-3 Fatty Acids (FISH OIL) 1200 MG CAPS  . omeprazole (PRILOSEC) 40 MG capsule  . pantoprazole (PROTONIX) 40 MG tablet  . tamsulosin (FLOMAX) 0.4 MG CAPS capsule  . traZODone (DESYREL) 50 MG tablet   No current facility-administered medications for this encounter.     07-09-2007, PA-C [facility] Pre-Surgical Testing [phone]

## 2021-01-10 LAB — SARS CORONAVIRUS 2 (TAT 6-24 HRS): SARS Coronavirus 2: NEGATIVE

## 2021-01-12 ENCOUNTER — Encounter (HOSPITAL_COMMUNITY): Payer: Self-pay | Admitting: Surgery

## 2021-01-12 ENCOUNTER — Ambulatory Visit (HOSPITAL_COMMUNITY): Payer: Medicare HMO

## 2021-01-12 ENCOUNTER — Other Ambulatory Visit: Payer: Self-pay

## 2021-01-12 ENCOUNTER — Ambulatory Visit (HOSPITAL_COMMUNITY): Payer: Medicare HMO | Admitting: Anesthesiology

## 2021-01-12 ENCOUNTER — Encounter (HOSPITAL_COMMUNITY)
Admission: RE | Disposition: A | Discharge status: Home / Self Care | Payer: Self-pay | Source: Home / Self Care | Attending: Surgery

## 2021-01-12 ENCOUNTER — Ambulatory Visit (HOSPITAL_COMMUNITY): Payer: Medicare HMO | Admitting: Physician Assistant

## 2021-01-12 ENCOUNTER — Ambulatory Visit (HOSPITAL_COMMUNITY)
Admission: RE | Admit: 2021-01-12 | Discharge: 2021-01-13 | Disposition: A | Discharge status: Home / Self Care | Payer: Medicare HMO | Attending: Surgery | Admitting: Surgery

## 2021-01-12 DIAGNOSIS — K838 Other specified diseases of biliary tract: Secondary | ICD-10-CM | POA: Diagnosis not present

## 2021-01-12 DIAGNOSIS — Z79899 Other long term (current) drug therapy: Secondary | ICD-10-CM | POA: Diagnosis not present

## 2021-01-12 DIAGNOSIS — I10 Essential (primary) hypertension: Secondary | ICD-10-CM | POA: Diagnosis not present

## 2021-01-12 DIAGNOSIS — K8010 Calculus of gallbladder with chronic cholecystitis without obstruction: Secondary | ICD-10-CM | POA: Diagnosis present

## 2021-01-12 DIAGNOSIS — E559 Vitamin D deficiency, unspecified: Secondary | ICD-10-CM | POA: Diagnosis not present

## 2021-01-12 DIAGNOSIS — Z888 Allergy status to other drugs, medicaments and biological substances status: Secondary | ICD-10-CM | POA: Diagnosis not present

## 2021-01-12 DIAGNOSIS — Z419 Encounter for procedure for purposes other than remedying health state, unspecified: Secondary | ICD-10-CM

## 2021-01-12 DIAGNOSIS — I2510 Atherosclerotic heart disease of native coronary artery without angina pectoris: Secondary | ICD-10-CM | POA: Diagnosis not present

## 2021-01-12 DIAGNOSIS — Z885 Allergy status to narcotic agent status: Secondary | ICD-10-CM | POA: Insufficient documentation

## 2021-01-12 HISTORY — PX: CHOLECYSTECTOMY: SHX55

## 2021-01-12 SURGERY — LAPAROSCOPIC CHOLECYSTECTOMY WITH INTRAOPERATIVE CHOLANGIOGRAM
Anesthesia: General | Site: Abdomen

## 2021-01-12 MED ORDER — ACETAMINOPHEN 325 MG PO TABS: 650.0000 mg | ORAL_TABLET | Freq: Four times a day (QID) | ORAL | Status: DC | PRN

## 2021-01-12 MED ORDER — SUGAMMADEX SODIUM 200 MG/2ML IV SOLN
INTRAVENOUS | Status: DC | PRN
  Administered 2021-01-12: 150 mg via INTRAVENOUS

## 2021-01-12 MED ORDER — FENTANYL CITRATE (PF) 100 MCG/2ML IJ SOLN
INTRAMUSCULAR | Status: AC
  Filled 2021-01-12: qty 2

## 2021-01-12 MED ORDER — ONDANSETRON HCL 4 MG/2ML IJ SOLN
INTRAMUSCULAR | Status: DC | PRN
  Administered 2021-01-12: 4 mg via INTRAVENOUS

## 2021-01-12 MED ORDER — METOPROLOL TARTRATE 25 MG PO TABS
25.0000 mg | ORAL_TABLET | Freq: Two times a day (BID) | ORAL | Status: DC
  Administered 2021-01-12 – 2021-01-13 (×2): 25 mg via ORAL
  Filled 2021-01-12 (×2): qty 1

## 2021-01-12 MED ORDER — LIDOCAINE 2% (20 MG/ML) 5 ML SYRINGE
INTRAMUSCULAR | Status: AC
  Filled 2021-01-12: qty 5

## 2021-01-12 MED ORDER — BUPIVACAINE-EPINEPHRINE 0.5% -1:200000 IJ SOLN
INTRAMUSCULAR | Status: DC | PRN
  Administered 2021-01-12: 20 mL

## 2021-01-12 MED ORDER — IOHEXOL 300 MG/ML  SOLN
INTRAMUSCULAR | Status: DC | PRN
  Administered 2021-01-12: 8.5 mL

## 2021-01-12 MED ORDER — LACTATED RINGERS IR SOLN
Status: DC | PRN
  Administered 2021-01-12: 1000 mL

## 2021-01-12 MED ORDER — SODIUM CHLORIDE 0.45 % IV SOLN: INTRAVENOUS | Status: DC

## 2021-01-12 MED ORDER — LIDOCAINE 2% (20 MG/ML) 5 ML SYRINGE
INTRAMUSCULAR | Status: DC | PRN
  Administered 2021-01-12: 60 mg via INTRAVENOUS

## 2021-01-12 MED ORDER — FENTANYL CITRATE (PF) 100 MCG/2ML IJ SOLN
INTRAMUSCULAR | Status: DC | PRN
  Administered 2021-01-12 (×2): 50 ug via INTRAVENOUS

## 2021-01-12 MED ORDER — CHLORHEXIDINE GLUCONATE 0.12 % MT SOLN
15.0000 mL | Freq: Once | OROMUCOSAL | Status: AC
  Administered 2021-01-12: 15 mL via OROMUCOSAL

## 2021-01-12 MED ORDER — 0.9 % SODIUM CHLORIDE (POUR BTL) OPTIME
TOPICAL | Status: DC | PRN
  Administered 2021-01-12: 1000 mL

## 2021-01-12 MED ORDER — ROCURONIUM BROMIDE 10 MG/ML (PF) SYRINGE
PREFILLED_SYRINGE | INTRAVENOUS | Status: AC
  Filled 2021-01-12: qty 10

## 2021-01-12 MED ORDER — DEXAMETHASONE SODIUM PHOSPHATE 10 MG/ML IJ SOLN
INTRAMUSCULAR | Status: AC
  Filled 2021-01-12: qty 1

## 2021-01-12 MED ORDER — ONDANSETRON HCL 4 MG/2ML IJ SOLN
INTRAMUSCULAR | Status: AC
  Filled 2021-01-12: qty 2

## 2021-01-12 MED ORDER — OXYCODONE HCL 5 MG PO TABS
5.0000 mg | ORAL_TABLET | ORAL | Status: DC | PRN
  Administered 2021-01-12 (×2): 10 mg via ORAL
  Filled 2021-01-12: qty 2

## 2021-01-12 MED ORDER — PROPOFOL 10 MG/ML IV BOLUS
INTRAVENOUS | Status: DC | PRN
  Administered 2021-01-12: 150 mg via INTRAVENOUS

## 2021-01-12 MED ORDER — PHENYLEPHRINE 40 MCG/ML (10ML) SYRINGE FOR IV PUSH (FOR BLOOD PRESSURE SUPPORT)
PREFILLED_SYRINGE | INTRAVENOUS | Status: DC | PRN
  Administered 2021-01-12: 80 ug via INTRAVENOUS
  Administered 2021-01-12: 120 ug via INTRAVENOUS

## 2021-01-12 MED ORDER — TRAZODONE HCL 50 MG PO TABS: 25.0000 mg | ORAL_TABLET | Freq: Every evening | ORAL | Status: DC | PRN

## 2021-01-12 MED ORDER — ONDANSETRON HCL 4 MG/2ML IJ SOLN: 4.0000 mg | Freq: Four times a day (QID) | INTRAMUSCULAR | Status: DC | PRN

## 2021-01-12 MED ORDER — ACETAMINOPHEN 650 MG RE SUPP: 650.0000 mg | Freq: Four times a day (QID) | RECTAL | Status: DC | PRN

## 2021-01-12 MED ORDER — CHLORHEXIDINE GLUCONATE CLOTH 2 % EX PADS: 6.0000 | MEDICATED_PAD | Freq: Once | CUTANEOUS | Status: DC

## 2021-01-12 MED ORDER — ONDANSETRON 4 MG PO TBDP: 4.0000 mg | ORAL_TABLET | Freq: Four times a day (QID) | ORAL | Status: DC | PRN

## 2021-01-12 MED ORDER — TRAMADOL HCL 50 MG PO TABS
50.0000 mg | ORAL_TABLET | Freq: Four times a day (QID) | ORAL | Status: DC | PRN
  Administered 2021-01-13: 50 mg via ORAL
  Filled 2021-01-12: qty 1

## 2021-01-12 MED ORDER — MELATONIN 5 MG PO TABS
5.0000 mg | ORAL_TABLET | Freq: Every day | ORAL | Status: DC
  Administered 2021-01-12: 5 mg via ORAL
  Filled 2021-01-12: qty 1

## 2021-01-12 MED ORDER — CEFAZOLIN SODIUM-DEXTROSE 2-4 GM/100ML-% IV SOLN
2.0000 g | INTRAVENOUS | Status: AC
  Administered 2021-01-12: 2 g via INTRAVENOUS
  Filled 2021-01-12: qty 100

## 2021-01-12 MED ORDER — OXYCODONE HCL 5 MG PO TABS
ORAL_TABLET | ORAL | Status: AC
  Filled 2021-01-12: qty 2

## 2021-01-12 MED ORDER — HYDROMORPHONE HCL 1 MG/ML IJ SOLN: 1.0000 mg | INTRAMUSCULAR | Status: DC | PRN

## 2021-01-12 MED ORDER — ORAL CARE MOUTH RINSE: 15.0000 mL | Freq: Once | OROMUCOSAL | Status: AC

## 2021-01-12 MED ORDER — LACTATED RINGERS IV SOLN: INTRAVENOUS | Status: DC

## 2021-01-12 MED ORDER — PANTOPRAZOLE SODIUM 40 MG PO TBEC
40.0000 mg | DELAYED_RELEASE_TABLET | Freq: Every day | ORAL | Status: DC
  Administered 2021-01-13: 40 mg via ORAL
  Filled 2021-01-12: qty 1

## 2021-01-12 MED ORDER — PROPOFOL 10 MG/ML IV BOLUS
INTRAVENOUS | Status: AC
  Filled 2021-01-12: qty 20

## 2021-01-12 MED ORDER — FENTANYL CITRATE (PF) 100 MCG/2ML IJ SOLN
25.0000 ug | INTRAMUSCULAR | Status: DC | PRN
  Administered 2021-01-12 (×3): 50 ug via INTRAVENOUS

## 2021-01-12 MED ORDER — PHENYLEPHRINE 40 MCG/ML (10ML) SYRINGE FOR IV PUSH (FOR BLOOD PRESSURE SUPPORT)
PREFILLED_SYRINGE | INTRAVENOUS | Status: AC
  Filled 2021-01-12: qty 10

## 2021-01-12 MED ORDER — ROCURONIUM BROMIDE 10 MG/ML (PF) SYRINGE
PREFILLED_SYRINGE | INTRAVENOUS | Status: DC | PRN
  Administered 2021-01-12: 10 mg via INTRAVENOUS
  Administered 2021-01-12: 60 mg via INTRAVENOUS

## 2021-01-12 MED ORDER — DEXAMETHASONE SODIUM PHOSPHATE 10 MG/ML IJ SOLN
INTRAMUSCULAR | Status: DC | PRN
  Administered 2021-01-12: 4 mg via INTRAVENOUS

## 2021-01-12 SURGICAL SUPPLY — 34 items
APPLIER CLIP ROT 10 11.4 M/L (STAPLE) ×2
CABLE HIGH FREQUENCY MONO STRZ (ELECTRODE) ×2 IMPLANT
CHLORAPREP W/TINT 26 (MISCELLANEOUS) ×4 IMPLANT
CLIP APPLIE ROT 10 11.4 M/L (STAPLE) ×1 IMPLANT
COVER MAYO STAND STRL (DRAPES) ×2 IMPLANT
COVER SURGICAL LIGHT HANDLE (MISCELLANEOUS) ×2 IMPLANT
COVER WAND RF STERILE (DRAPES) IMPLANT
DECANTER SPIKE VIAL GLASS SM (MISCELLANEOUS) ×2 IMPLANT
DERMABOND ADVANCED (GAUZE/BANDAGES/DRESSINGS)
DERMABOND ADVANCED .7 DNX12 (GAUZE/BANDAGES/DRESSINGS) IMPLANT
DRAPE C-ARM 42X120 X-RAY (DRAPES) ×2 IMPLANT
ELECT REM PT RETURN 15FT ADLT (MISCELLANEOUS) ×2 IMPLANT
GAUZE SPONGE 2X2 8PLY STRL LF (GAUZE/BANDAGES/DRESSINGS) ×1 IMPLANT
GLOVE SURG ORTHO LTX SZ8 (GLOVE) ×2 IMPLANT
GOWN STRL REUS W/TWL XL LVL3 (GOWN DISPOSABLE) ×4 IMPLANT
HEMOSTAT SURGICEL 4X8 (HEMOSTASIS) IMPLANT
KIT BASIN OR (CUSTOM PROCEDURE TRAY) ×2 IMPLANT
KIT TURNOVER KIT A (KITS) ×2 IMPLANT
PENCIL SMOKE EVACUATOR (MISCELLANEOUS) IMPLANT
POUCH SPECIMEN RETRIEVAL 10MM (ENDOMECHANICALS) ×2 IMPLANT
SCISSORS LAP 5X35 DISP (ENDOMECHANICALS) ×2 IMPLANT
SET CHOLANGIOGRAPH MIX (MISCELLANEOUS) ×2 IMPLANT
SET IRRIG TUBING LAPAROSCOPIC (IRRIGATION / IRRIGATOR) ×2 IMPLANT
SET TUBE SMOKE EVAC HIGH FLOW (TUBING) IMPLANT
SLEEVE XCEL OPT CAN 5 100 (ENDOMECHANICALS) ×2 IMPLANT
SPONGE GAUZE 2X2 STER 10/PKG (GAUZE/BANDAGES/DRESSINGS) ×1
STRIP CLOSURE SKIN 1/2X4 (GAUZE/BANDAGES/DRESSINGS) IMPLANT
SUT MNCRL AB 4-0 PS2 18 (SUTURE) ×2 IMPLANT
TOWEL OR 17X26 10 PK STRL BLUE (TOWEL DISPOSABLE) ×2 IMPLANT
TOWEL OR NON WOVEN STRL DISP B (DISPOSABLE) ×2 IMPLANT
TRAY LAPAROSCOPIC (CUSTOM PROCEDURE TRAY) ×2 IMPLANT
TROCAR BLADELESS OPT 5 100 (ENDOMECHANICALS) ×2 IMPLANT
TROCAR XCEL BLUNT TIP 100MML (ENDOMECHANICALS) ×2 IMPLANT
TROCAR XCEL NON-BLD 11X100MML (ENDOMECHANICALS) ×2 IMPLANT

## 2021-01-12 NOTE — Transfer of Care (Signed)
Immediate Anesthesia Transfer of Care Note  Patient: [name] Demello II  Procedure(s) Performed: LAPAROSCOPIC CHOLECYSTECTOMY WITH INTRAOPERATIVE CHOLANGIOGRAM (N/A Abdomen)  Patient Location: PACU  Anesthesia Type:General  Level of Consciousness: awake, alert  and oriented  Airway & Oxygen Therapy: Patient Spontanous Breathing and Patient connected to face mask oxygen  Post-op Assessment: Report given to RN, Post -op Vital signs reviewed and stable and Patient moving all extremities X 4  Post vital signs: Reviewed and stable  Last Vitals:  Vitals Value Taken Time  BP 180/105 01/12/21 1422  Temp    Pulse 100 01/12/21 1423  Resp 26 01/12/21 1423  SpO2 100 % 01/12/21 1423  Vitals shown include unvalidated device data.  Last Pain:  Vitals:   01/12/21 1119  PainSc: 0-No pain         Complications: No complications documented.

## 2021-01-12 NOTE — Op Note (Signed)
Operative Note  Pre-operative Diagnosis:  Chronic cholecystitis, cholelithiasis  Post-operative Diagnosis:  same  Surgeon:  Darnell Level, MD  Assistant:  Phylliss Blakes, MD   Procedure:  Laparoscopic cholecystectomy with cholangiography  Anesthesia:  general  Estimated Blood Loss:  25 cc  Drains: none         Specimen: gallbladder to pathology  Indications:  Patient is referred by Dr. [name] for surgical evaluation and management of symptomatic cholelithiasis and chronic cholecystitis. Patient's cardiologist is Dr. Delane Ginger. Patient began having episodes of biliary colic at Thanksgiving of 2021. He had another significant episode at Christmas. He has had intermittent mild episodes of biliary colic until he had a more severe event in early February 2022. He describes epigastric abdominal pain. He denies any nausea or vomiting. He denies fevers or chills. He denies any history of jaundice. He has no history of hepatobiliary or pancreatic disease. He has never had hepatitis or pancreatitis. He has had no prior abdominal surgery. There is no family history of gallbladder disease. Patient underwent an ultrasound on December 01, 2020. This showed essentially a heterogeneous mass in the gallbladder measuring 4.9 x 2.4 x 2.8 cm. This required further evaluation with an MRI scan which actually documented cholelithiasis and gallbladder sludge. Patient now presents for cholecystectomy.   Procedure:  The patient was seen in the pre-op holding area. The risks, benefits, complications, treatment options, and expected outcomes were previously discussed with the patient. The patient agreed with the proposed plan and has signed the informed consent form.  The patient was brought to the operating room by the surgical team, identified as [name] [name] and the procedure verified. A "time out" was completed and the above information confirmed.  Following administration of general  endotracheal anesthesia, the patient was positioned and prepped and draped in the usual aseptic fashion.  After ascertaining that an adequate level of anesthesia been achieved, an incision is made in the midline just below the umbilicus.  Dissection is carried down to the fascia.  The fascia is incised in the midline and the peritoneal cavity is entered cautiously.  A 0 Vicryl pursestring suture is placed in the fascia.  An Hassan cannula is introduced under direct vision and secured with the pursestring suture.  Abdomen is insufflated with carbon dioxide.  Laparoscope was introduced and the abdomen is explored.  There are adhesions of the omentum to the undersurface of the liver and the gallbladder and the right upper quadrant.  Operative ports were placed along the right costal margin in the midclavicular line, anterior axillary line, and subxiphoid locations.  Omentum is gently mobilized and dissected free of the surface of the gallbladder and the undersurface of the liver exposing a relatively small, thick-walled, and contracted gallbladder which is somewhat intrahepatic.  Dissection is begun at the neck of the gallbladder.  Peritoneum is somewhat fibrotic and thickened and dissection is difficult.  The neck of the gallbladder is defined and the proximal cystic duct is dissected out.  The proximal cystic duct is then dissected out circumferentially.  A clip was placed at the neck of the gallbladder.  Cystic duct is incised.  A Cook cholangiography catheter is introduced through a stab wound in the right upper quadrant and inserted into the cystic duct.  It is secured with a Ligaclip.  Using C arm fluoroscopy, real-time cholangiography is performed.  There is rapid filling of a relatively normal caliber common bile duct with free flow distally into the duodenum.  There are no filling defects and no sign of obstruction.  There is reflux of contrast into the right and left hepatic ductal systems.  Clip is  withdrawn and Cook catheter is removed from the peritoneal cavity.  Cystic duct is triply clipped and divided sharply with the scissors.  Next we began to dissect out the cystic artery.  It appears that there is a prominent right hepatic artery within the triangle of Calot.  There appeared to be short arterial branches from the right hepatic to the gallbladder.  These are dissected out individually and divided between ligaclips with the electrocautery.  The gallbladder is densely fibrotic and somewhat intrahepatic.  Using the electrocautery it is dissected out of the gallbladder bed until it is completely resected.  It is then placed into an Endo Catch bag and withdrawn through the umbilical port.  On palpation it does contain multiple small gallstones.  It is submitted to pathology for review.  Right upper quadrant is irrigated with warm saline.  Good hemostasis is noted.  Clips on the cystic duct are again inspected and appear to be in good position with no sign of leakage from the cystic duct stump.  Right upper quadrant is again irrigated and evacuated.  Endo Catch bag containing the gallbladder is then extracted through the umbilical wound.  Pursestring sutures tied securely.  Ports in the right upper quadrant were then removed under direct vision with good hemostasis noted.  Pneumoperitoneum is released.  Port sites are anesthetized with local anesthetic.  All wounds are closed with interrupted 4-0 Monocryl subcuticular sutures.  Wounds are washed and dried and Dermabond is applied as dressing.  Patient is awakened from anesthesia and transported to the recovery room in stable condition.  The patient tolerated the procedure well.   Darnell Level, MD [facility] Office: [phone]

## 2021-01-12 NOTE — Anesthesia Procedure Notes (Signed)
Procedure Name: Intubation Date/Time: 01/12/2021 12:44 PM Performed by: Niel Hummer, CRNA Pre-anesthesia Checklist: Patient identified, Emergency Drugs available, Suction available and Patient being monitored Patient Re-evaluated:Patient Re-evaluated prior to induction Oxygen Delivery Method: Circle system utilized Preoxygenation: Pre-oxygenation with 100% oxygen Induction Type: IV induction Ventilation: Mask ventilation without difficulty Laryngoscope Size: Mac and 4 Grade View: Grade I Tube type: Oral Tube size: 7.5 mm Number of attempts: 1 Airway Equipment and Method: Stylet Placement Confirmation: ETT inserted through vocal cords under direct vision,  positive ETCO2 and breath sounds checked- equal and bilateral Secured at: 22 cm Tube secured with: Tape Dental Injury: Teeth and Oropharynx as per pre-operative assessment

## 2021-01-12 NOTE — H&P (Signed)
General Surgery [facility]  [name] [name] DOB: Feb 18, 1941 Married / Language: English / Race: White Male   History of Present Illness  The patient is a 80 year old male who presents for evaluation of gall stones.  CHIEF COMPLAINT: chronic cholecystitis, cholelithiasis  Patient is referred by Dr. Garret Reddish for surgical evaluation and management of symptomatic cholelithiasis and chronic cholecystitis. Patient's cardiologist is Dr. Liam Rogers. Patient began having episodes of biliary colic at Thanksgiving of 2021. He had another significant episode at Christmas. He has had intermittent mild episodes of biliary colic until he had a more severe event in early February 2022. He describes epigastric abdominal pain. He denies any nausea or vomiting. He denies fevers or chills. He denies any history of jaundice. He has no history of hepatobiliary or pancreatic disease. He has never had hepatitis or pancreatitis. He has had no prior abdominal surgery. There is no family history of gallbladder disease. Patient underwent an ultrasound on December 01, 2020. This showed essentially a heterogeneous mass in the gallbladder measuring 4.9 x 2.4 x 2.8 cm. This required further evaluation which actually documented cholelithiasis and gallbladder sludge. Patient now presents to discuss cholecystectomy. 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   Past Surgical History  Colon Polyp Removal - Colonoscopy  Knee Surgery  Right.  Allergies  Allergies Reconciled  No Known Drug Allergies   Medication History  Atorvastatin Calcium (40MG Tablet, Oral) Active. Durezol (0.05% Emulsion, Ophthalmic) Active. Metoprolol Tartrate (25MG Tablet, Oral) Active. Omeprazole (40MG Capsule DR, Oral) Active. Suprep Bowel Prep Kit (17.5-3.13-1.6GM/177ML Solution, Oral) Active. Tamsulosin HCl (0.4MG Capsule, Oral) Active. traZODone HCl (50MG Tablet, Oral)  Active. Medications Reconciled  Social History  Caffeine use  Carbonated beverages, Coffee. No alcohol use  No drug use  Tobacco use  Never smoker.  Family History Alcohol Abuse  Father.  Other Problems  Anxiety Disorder  Gastroesophageal Reflux Disease  Hypercholesterolemia  Other disease, cancer, significant illness   Review of Systems  Skin Not Present- Change in Wart/Mole, Dryness, Hives, Jaundice, New Lesions, Non-Healing Wounds, Rash and Ulcer. HEENT Present- Seasonal Allergies and Wears glasses/contact lenses. Not Present- Earache, Hearing Loss, Hoarseness, Nose Bleed, Oral Ulcers, Ringing in the Ears, Sinus Pain, Sore Throat, Visual Disturbances and Yellow Eyes. Breast Not Present- Breast Mass, Breast Pain, Nipple Discharge and Skin Changes. Cardiovascular Not Present- Chest Pain, Difficulty Breathing Lying Down, Leg Cramps, Palpitations, Rapid Heart Rate, Shortness of Breath and Swelling of Extremities. Gastrointestinal Present- Abdominal Pain. Not Present- Bloating, Bloody Stool, Change in Bowel Habits, Chronic diarrhea, Constipation, Difficulty Swallowing, Excessive gas, Gets full quickly at meals, Hemorrhoids, Indigestion, Nausea, Rectal Pain and Vomiting. Male Genitourinary Present- Nocturia. Not Present- Blood in Urine, Change in Urinary Stream, Frequency, Impotence, Painful Urination, Urgency and Urine Leakage. Musculoskeletal Present- Back Pain and Joint Stiffness. Not Present- Joint Pain, Muscle Pain, Muscle Weakness and Swelling of Extremities. Neurological Not Present- Decreased Memory, Fainting, Headaches, Numbness, Seizures, Tingling, Tremor, Trouble walking and Weakness. Psychiatric Present- Anxiety. Not Present- Bipolar, Change in Sleep Pattern, Depression, Fearful and Frequent crying. Hematology Not Present- Blood Thinners, Easy Bruising, Excessive bleeding, Gland problems, HIV and Persistent Infections.  Vitals  Weight: 162.38 lb Height:  68in Body Surface Area: 1.87 m Body Mass Index: 24.69 kg/m  Temp.: 96.37F  Pulse: 73 (Regular)  Resp.: 98 (Unlabored)  BP: 118/76(Sitting, Left Arm, Standard)  Physical Exam   GENERAL APPEARANCE Development: normal Nutritional status: normal Gross deformities: none  SKIN Rash,  lesions, ulcers: none Induration, erythema: none Nodules: none palpable  EYES Conjunctiva and lids: normal Pupils: equal and reactive Iris: normal bilaterally  EARS, NOSE, MOUTH, THROAT External ears: no lesion or deformity External nose: no lesion or deformity Hearing: grossly normal Due to Covid-19 pandemic, patient is wearing a mask.  NECK Symmetric: yes Trachea: midline Thyroid: no palpable nodules in the thyroid bed  CHEST Respiratory effort: normal Retraction or accessory muscle use: no Breath sounds: normal bilaterally Rales, rhonchi, wheeze: none  CARDIOVASCULAR Auscultation: regular rhythm, normal rate Murmurs: none Pulses: radial pulse 2+ palpable Lower extremity edema: none  ABDOMEN Distension: none Masses: none palpable Tenderness: none Hepatosplenomegaly: not present Hernia: not present  MUSCULOSKELETAL Station and gait: normal Digits and nails: no clubbing or cyanosis Muscle strength: grossly normal all extremities Range of motion: grossly normal all extremities Deformity: none  LYMPHATIC Cervical: none palpable Supraclavicular: none palpable  PSYCHIATRIC Oriented to person, place, and time: yes Mood and affect: normal for situation Judgment and insight: appropriate for situation    Assessment & Plan   CHOLELITHIASIS WITH CHRONIC CHOLECYSTITIS (K80.10)  Patient is referred by his primary care physician for surgical evaluation and management of chronic cholecystitis and cholelithiasis. Patient is provided with written literature on gallbladder surgery to review at home.  Patient has had several episodes of biliary colic without  complications. Ultrasound and MRI confirmed cholelithiasis. I have recommended proceeding with laparoscopic cholecystectomy with intraoperative cholangiography. We discussed the procedure. We discussed potential complications including conversion to open surgery. We discussed the hospital stay to be anticipated. We discussed his postoperative recovery and return to normal activities. The patient understands and wishes to proceed with surgery in the near future.  The risks and benefits of the procedure have been discussed at length with the patient. The patient understands the proposed procedure, potential alternative treatments, and the course of recovery to be expected. All of the patient's questions have been answered at this time. The patient wishes to proceed with surgery.  Armandina Gemma, MD [facility] Office: [phone]

## 2021-01-12 NOTE — Progress Notes (Signed)
Pt tolerated clear liquids in recovery, crackers and clears on unit. Denies n/v, increased abd pain. Diet order changed to regular. IVF d/c'd per previous order. rn will continue to monitor pt for n/v and abd pain.

## 2021-01-12 NOTE — Interval H&P Note (Signed)
History and Physical Interval Note:  01/12/2021 12:01 PM  [name] [name]  has presented today for surgery, with the diagnosis of CHRONIC CHOLECYSTITIS, CHOLELITHIASIS.  The various methods of treatment have been discussed with the patient and family. After consideration of risks, benefits and other options for treatment, the patient has consented to    Procedure(s) with comments: LAPAROSCOPIC CHOLECYSTECTOMY WITH INTRAOPERATIVE CHOLANGIOGRAM (N/A) - 90MIN/RM2 as a surgical intervention.    The patient's history has been reviewed, patient examined, no change in status, stable for surgery.  I have reviewed the patient's chart and labs.  Questions were answered to the patient's satisfaction.    Darnell Level, MD [facility] Office: [phone]   Darnell Level

## 2021-01-12 NOTE — Anesthesia Preprocedure Evaluation (Addendum)
Anesthesia Evaluation  Patient identified by MRN, date of birth, ID band Patient awake    Reviewed: Allergy & Precautions, NPO status , Patient's Chart, lab work & pertinent test results  History of Anesthesia Complications (+) MALIGNANT HYPERTHERMIA  Airway Mallampati: II       Dental   Pulmonary neg pulmonary ROS,    breath sounds clear to auscultation       Cardiovascular hypertension, + CAD   Rhythm:Regular Rate:Normal     Neuro/Psych Anxiety negative neurological ROS     GI/Hepatic Neg liver ROS, GERD  ,  Endo/Other  negative endocrine ROS  Renal/GU negative Renal ROS     Musculoskeletal   Abdominal   Peds  Hematology   Anesthesia Other Findings   Reproductive/Obstetrics                            Anesthesia Physical Anesthesia Plan  ASA: III  Anesthesia Plan: General   Post-op Pain Management:    Induction: Intravenous  PONV Risk Score and Plan: 2 and Ondansetron, Dexamethasone and Midazolam  Airway Management Planned: Oral ETT  Additional Equipment:   Intra-op Plan:   Post-operative Plan: Extubation in OR  Informed Consent: I have reviewed the patients History and Physical, chart, labs and discussed the procedure including the risks, benefits and alternatives for the proposed anesthesia with the patient or authorized representative who has indicated his/her understanding and acceptance.     Dental advisory given  Plan Discussed with: CRNA and Anesthesiologist  Anesthesia Plan Comments:         Anesthesia Quick Evaluation

## 2021-01-12 NOTE — Anesthesia Postprocedure Evaluation (Signed)
Anesthesia Post Note  Patient: [name] [name]  Procedure(s) Performed: LAPAROSCOPIC CHOLECYSTECTOMY WITH INTRAOPERATIVE CHOLANGIOGRAM (N/A Abdomen)     Patient location during evaluation: PACU Anesthesia Type: General Level of consciousness: awake Pain management: pain level controlled Vital Signs Assessment: post-procedure vital signs reviewed and stable Respiratory status: spontaneous breathing Cardiovascular status: stable Postop Assessment: no apparent nausea or vomiting Anesthetic complications: no   No complications documented.  Last Vitals:  Vitals:   01/12/21 1507 01/12/21 1515  BP:  (!) 156/96  Pulse: 80 80  Resp: 17 (!) 24  Temp:  36.6 C  SpO2: 100% 100%    Last Pain:  Vitals:   01/12/21 1530  PainSc: 2                  Marylee Belzer

## 2021-01-13 ENCOUNTER — Encounter (HOSPITAL_COMMUNITY): Payer: Self-pay | Admitting: Surgery

## 2021-01-13 DIAGNOSIS — Z885 Allergy status to narcotic agent status: Secondary | ICD-10-CM | POA: Diagnosis not present

## 2021-01-13 DIAGNOSIS — K8010 Calculus of gallbladder with chronic cholecystitis without obstruction: Secondary | ICD-10-CM | POA: Diagnosis not present

## 2021-01-13 DIAGNOSIS — Z888 Allergy status to other drugs, medicaments and biological substances status: Secondary | ICD-10-CM | POA: Diagnosis not present

## 2021-01-13 DIAGNOSIS — Z79899 Other long term (current) drug therapy: Secondary | ICD-10-CM | POA: Diagnosis not present

## 2021-01-13 LAB — SURGICAL PATHOLOGY

## 2021-01-13 MED ORDER — TRAMADOL HCL 50 MG PO TABS: 50.0000 mg | ORAL_TABLET | Freq: Four times a day (QID) | ORAL | 0 refills | Status: DC | PRN

## 2021-01-13 NOTE — Discharge Summary (Signed)
Physician Discharge Summary [facility]  Patient ID: [name] [name] MRN: [id number] DOB/AGE: Mar 31, 1941 80 y.o.  Admit date: 01/12/2021  Discharge date: 01/13/2021  Discharge Diagnoses:  Principal Problem:   Cholelithiasis with chronic cholecystitis   Discharged Condition: good  Hospital Course: Patient was admitted for observation following gallbladder surgery.  Post op course was uncomplicated.  Pain was well controlled.  Tolerated diet. Patient was prepared for discharge home on POD#1.  Consults: None  Treatments: surgery: lap chole with IOC  Discharge Exam: Blood pressure 116/68, pulse 69, temperature 98.3 F (36.8 C), temperature source Oral, resp. rate 17, height 5\' 8"  (1.727 m), weight 72.7 kg, SpO2 98 %. HEENT - clear Neck - soft Chest - clear bilaterally Cor - RRR Abd - wounds dry and intact; soft without distension; mild tenderness  Disposition: Home  Discharge Instructions    Diet - low sodium heart healthy   Complete by: As directed    Discharge instructions   Complete by: As directed    [facility]  LAPAROSCOPIC SURGERY:  POST-OP INSTRUCTIONS  Always review your discharge instruction sheet given to you by the facility where your surgery was performed.  A prescription for pain medication may be given to you upon discharge.  Take your pain medication as prescribed.  If narcotic pain medicine is not needed, then you may take acetaminophen (Tylenol) or ibuprofen (Advil) as needed.  Take your usually prescribed medications unless otherwise directed.  If you need a refill on your pain medication, please contact your pharmacy.  They will contact our office to request authorization. Prescriptions will not be filled after 5 P.M. or on weekends.  You should follow a light diet the first few days after arrival home, such as soup and crackers or toast.  Be sure to include plenty of fluids daily.  Most patients will  experience some swelling and bruising in the area of the incisions.  Ice packs will help.  Swelling and bruising can take several days to resolve.   It is common to experience some constipation after surgery.  Increasing fluid intake and taking a stool softener (such as Colace) will usually help or prevent this problem from occurring.  A mild laxative (Milk of Magnesia or Miralax) should be taken according to package instructions if there has been no bowel movement after 48 hours.  You will likely have Dermabond (topical glue) over your incisions.  This seals the incisions and allows you to bathe and shower at any time after your surgery.  Glue should remain in place for up to 10 days.  It may be removed after 10 days by pealing off the Dermabond material or using Vaseline or naval jelly to remove.  If you have steri-strips over your incisions, you may remove the gauze bandage on the second day after surgery, and you may shower at that time.  Leave your steri-strips (small skin tapes) in place directly over the incision.  These strips should remain on the skin for 5-7 days and then be removed.  You may get them wet in the shower and pat them dry.  Any sutures or staples will be removed at the office during your follow-up visit.  ACTIVITIES:  You may resume regular (light) daily activities beginning the next day - such as daily self-care, walking, climbing stairs - gradually increasing activities as tolerated.  You may have sexual intercourse when it is comfortable.  Refrain from any heavy lifting or straining  until approved by your doctor.  You may drive when you are no longer taking prescription pain medication, when you can comfortably wear a seatbelt, and when you can safely maneuver your car and apply brakes.  You should see your doctor in the office for a follow-up appointment approximately 2-3 weeks after your surgery.  Make sure that you call for this appointment within a day or two after you  arrive home to insure a convenient appointment time.  WHEN TO CALL YOUR DOCTOR: Fever over 101.0 Inability to urinate Continued bleeding from incision Increased pain, redness, or drainage from the incision Increasing abdominal pain  The clinic staff is available to answer your questions during regular business hours.  Please don't hesitate to call and ask to speak to one of the nurses for clinical concerns.  If you have a medical emergency, go to the nearest emergency room or call 911.  A surgeon from [facility] is always on call for the hospital.  Darnell Level, MD [facility] Office: [phone] Toll Free:  [phone] FAX [phone]  Website: [email or web address]   Increase activity slowly   Complete by: As directed    No dressing needed   Complete by: As directed      Allergies as of 01/13/2021      Reactions   Hydrocodone Nausea And Vomiting   Simvastatin Other (See Comments)   Leg pain      Medication List    TAKE these medications   aspirin 81 MG tablet Take 1 tablet (81 mg total) by mouth daily.   atorvastatin 40 MG tablet Commonly known as: LIPITOR Take 1 tablet (40 mg total) by mouth daily.   CALCIUM + D PO Take 1 tablet by mouth daily.   Fish Oil 1200 MG Caps Take 1,200 mg by mouth daily.   fluticasone 50 MCG/ACT nasal spray Commonly known as: FLONASE Place 2 sprays into both nostrils daily. What changed:   when to take this  reasons to take this   glucosamine-chondroitin 500-400 MG tablet Take 1 tablet by mouth 2 (two) times daily.   melatonin 5 MG Tabs Take 5 mg by mouth at bedtime.   metoprolol tartrate 25 MG tablet Commonly known as: LOPRESSOR Take 1 tablet (25 mg total) by mouth 2 (two) times daily.   MULTIVITAMIN PO Take 1 tablet by mouth daily.   omeprazole 40 MG capsule Commonly known as: PRILOSEC Take 40 mg by mouth daily.   pantoprazole 40 MG tablet Commonly known as:  PROTONIX Take 1 tablet (40 mg total) by mouth daily.   tamsulosin 0.4 MG Caps capsule Commonly known as: FLOMAX Take 1 capsule (0.4 mg total) by mouth daily.   traMADol 50 MG tablet Commonly known as: ULTRAM Take 1-2 tablets (50-100 mg total) by mouth every 6 (six) hours as needed.   traZODone 50 MG tablet Commonly known as: DESYREL Take 0.5-1 tablets (25-50 mg total) by mouth at bedtime as needed for sleep.            Discharge Care Instructions  (From admission, onward)         Start     Ordered   01/13/21 0000  No dressing needed        01/13/21 2831          Follow-up Information    Darnell Level, MD. Schedule an appointment as soon as possible for a visit in 3 week(s).   Specialty: General Surgery Contact information: [address]  [address] [place] [address] [phone]               Darnell Level, MD [facility] Office: [phone]   Signed: Darnell Level 01/13/2021, 9:20 AM

## 2021-01-13 NOTE — Progress Notes (Signed)
Patient was given discharge instructions, and all questions were answered. Patient was stable for discharge and was walked to the main exit. 

## 2021-01-16 DIAGNOSIS — H2511 Age-related nuclear cataract, right eye: Secondary | ICD-10-CM | POA: Diagnosis not present

## 2021-01-16 DIAGNOSIS — H2512 Age-related nuclear cataract, left eye: Secondary | ICD-10-CM | POA: Diagnosis not present

## 2021-01-30 ENCOUNTER — Other Ambulatory Visit: Payer: Self-pay | Admitting: Family Medicine

## 2021-02-06 DIAGNOSIS — H2512 Age-related nuclear cataract, left eye: Secondary | ICD-10-CM | POA: Diagnosis not present

## 2021-02-24 ENCOUNTER — Ambulatory Visit: Payer: Medicare HMO | Attending: Internal Medicine

## 2021-02-24 DIAGNOSIS — Z20822 Contact with and (suspected) exposure to covid-19: Secondary | ICD-10-CM

## 2021-02-25 LAB — NOVEL CORONAVIRUS, NAA: SARS-CoV-2, NAA: DETECTED — AB

## 2021-02-25 LAB — SARS-COV-2, NAA 2 DAY TAT

## 2021-02-26 ENCOUNTER — Telehealth: Payer: Self-pay

## 2021-02-26 NOTE — Telephone Encounter (Signed)
Called to discuss with patient about COVID-19 symptoms and the use of one of the available treatments for those with mild to moderate Covid symptoms and at a high risk of hospitalization.  Pt appears to qualify for outpatient treatment due to co-morbid conditions and/or a member of an at-risk group in accordance with the FDA Emergency Use Authorization.    Symptom onset: 02/21/21  Vaccinated: Yes Booster? Yes Immunocompromised? No Qualifiers: CAD,HTN NIH Criteria: tIER 1 Declines further treatment.   [name] [name]

## 2021-03-12 DIAGNOSIS — H524 Presbyopia: Secondary | ICD-10-CM | POA: Diagnosis not present

## 2021-03-12 DIAGNOSIS — H52223 Regular astigmatism, bilateral: Secondary | ICD-10-CM | POA: Diagnosis not present

## 2021-03-30 DIAGNOSIS — M7062 Trochanteric bursitis, left hip: Secondary | ICD-10-CM | POA: Diagnosis not present

## 2021-03-30 DIAGNOSIS — M25552 Pain in left hip: Secondary | ICD-10-CM | POA: Diagnosis not present

## 2021-04-08 ENCOUNTER — Encounter: Payer: Self-pay | Admitting: Family Medicine

## 2021-04-08 ENCOUNTER — Other Ambulatory Visit: Payer: Self-pay

## 2021-04-08 ENCOUNTER — Ambulatory Visit (INDEPENDENT_AMBULATORY_CARE_PROVIDER_SITE_OTHER): Payer: Medicare HMO | Admitting: Family Medicine

## 2021-04-08 VITALS — BP 124/71 | HR 73 | Temp 97.6°F | Ht 68.0 in | Wt 152.6 lb

## 2021-04-08 DIAGNOSIS — I2510 Atherosclerotic heart disease of native coronary artery without angina pectoris: Secondary | ICD-10-CM | POA: Diagnosis not present

## 2021-04-08 DIAGNOSIS — E785 Hyperlipidemia, unspecified: Secondary | ICD-10-CM

## 2021-04-08 DIAGNOSIS — Z1159 Encounter for screening for other viral diseases: Secondary | ICD-10-CM | POA: Diagnosis not present

## 2021-04-08 DIAGNOSIS — I10 Essential (primary) hypertension: Secondary | ICD-10-CM | POA: Diagnosis not present

## 2021-04-08 DIAGNOSIS — E010 Iodine-deficiency related diffuse (endemic) goiter: Secondary | ICD-10-CM | POA: Insufficient documentation

## 2021-04-08 DIAGNOSIS — R351 Nocturia: Secondary | ICD-10-CM

## 2021-04-08 DIAGNOSIS — E559 Vitamin D deficiency, unspecified: Secondary | ICD-10-CM

## 2021-04-08 DIAGNOSIS — I700 Atherosclerosis of aorta: Secondary | ICD-10-CM

## 2021-04-08 DIAGNOSIS — Z0000 Encounter for general adult medical examination without abnormal findings: Secondary | ICD-10-CM

## 2021-04-08 DIAGNOSIS — I739 Peripheral vascular disease, unspecified: Secondary | ICD-10-CM | POA: Diagnosis not present

## 2021-04-08 LAB — CBC WITH DIFFERENTIAL/PLATELET
Basophils Absolute: 0.1 10*3/uL (ref 0.0–0.1)
Basophils Relative: 1.1 % (ref 0.0–3.0)
Eosinophils Absolute: 0.1 10*3/uL (ref 0.0–0.7)
Eosinophils Relative: 2.4 % (ref 0.0–5.0)
HCT: 42.5 % (ref 39.0–52.0)
Hemoglobin: 14.5 g/dL (ref 13.0–17.0)
Lymphocytes Relative: 25.4 % (ref 12.0–46.0)
Lymphs Abs: 1.5 10*3/uL (ref 0.7–4.0)
MCHC: 34.1 g/dL (ref 30.0–36.0)
MCV: 89.6 fl (ref 78.0–100.0)
Monocytes Absolute: 0.6 10*3/uL (ref 0.1–1.0)
Monocytes Relative: 10.1 % (ref 3.0–12.0)
Neutro Abs: 3.7 10*3/uL (ref 1.4–7.7)
Neutrophils Relative %: 61 % (ref 43.0–77.0)
Platelets: 240 10*3/uL (ref 150.0–400.0)
RBC: 4.74 Mil/uL (ref 4.22–5.81)
RDW: 14.5 % (ref 11.5–15.5)
WBC: 6 10*3/uL (ref 4.0–10.5)

## 2021-04-08 LAB — COMPREHENSIVE METABOLIC PANEL
ALT: 18 U/L (ref 0–53)
AST: 15 U/L (ref 0–37)
Albumin: 4.1 g/dL (ref 3.5–5.2)
Alkaline Phosphatase: 49 U/L (ref 39–117)
BUN: 19 mg/dL (ref 6–23)
CO2: 31 mEq/L (ref 19–32)
Calcium: 9.5 mg/dL (ref 8.4–10.5)
Chloride: 100 mEq/L (ref 96–112)
Creatinine, Ser: 0.76 mg/dL (ref 0.40–1.50)
GFR: 86.51 mL/min (ref >60.00)
Glucose, Bld: 83 mg/dL (ref 70–99)
Potassium: 4.2 mEq/L (ref 3.5–5.1)
Sodium: 136 mEq/L (ref 135–145)
Total Bilirubin: 0.8 mg/dL (ref 0.2–1.2)
Total Protein: 6.5 g/dL (ref 6.0–8.3)

## 2021-04-08 LAB — VITAMIN D 25 HYDROXY (VIT D DEFICIENCY, FRACTURES): VITD: 57.3 ng/mL (ref 30.00–100.00)

## 2021-04-08 LAB — LDL CHOLESTEROL, DIRECT: Direct LDL: 58 mg/dL

## 2021-04-08 LAB — PSA: PSA: 0.52 ng/mL (ref 0.10–4.00)

## 2021-04-08 LAB — TSH: TSH: 0.36 u[IU]/mL (ref 0.35–5.50)

## 2021-04-08 MED ORDER — OMEPRAZOLE 40 MG PO CPDR: 40.0000 mg | DELAYED_RELEASE_CAPSULE | Freq: Every day | ORAL | 3 refills | Status: DC

## 2021-04-08 MED ORDER — TAMSULOSIN HCL 0.4 MG PO CAPS: 0.4000 mg | ORAL_CAPSULE | Freq: Every day | ORAL | 3 refills | Status: DC

## 2021-04-08 NOTE — Patient Instructions (Addendum)
Health Maintenance Due  Topic Date Due   Zoster Vaccines- Shingrix (1 of 2) Please check with your pharmacy to see if they have the shingrix vaccine. If they do- please get this immunization and update [place] by phone call or mychart with dates you receive the vaccine  Never done   COVID-19 Vaccine (4 - Booster for [employer] series) Patient will get this schedule with his local pharmacy in the fall.  10/11/2020   If any further weight loss despite better appetite- see [place] back ASAP  Please stop by lab before you go If you have mychart- we will send your results within 3 business days of [place] receiving them.  If you do not have mychart- we will call you about results within 5 business days of [place] receiving them.  *please also note that you will see labs on mychart as soon as they post. I will later go in and write notes on them- will say "notes from Dr. Durene Cal"  Recommended follow up: Return in about 6 months (around 10/09/2021) for follow up- or sooner if needed. Can schedule physical a year out as well.

## 2021-04-08 NOTE — Progress Notes (Signed)
Phone: [phone]   Subjective:  Patient presents today for their annual physical. Chief complaint-noted.   See problem oriented charting- ROS- full  review of systems was completed and negative  except for: appetite was low before gallbladder issues- has improved- weight stabilized but had been losing- down almst 30 lbs over year. Urinary frequency, urgency, nocturia, seasonal allergies  The following were reviewed and entered/updated in epic: Past Medical History:  Diagnosis Date   Anxiety    Coronary artery disease    status post PTCA and stenting of his left circumflex artery and right coronary artery   GERD (gastroesophageal reflux disease)    H/O heart artery stent    Hyperlipidemia    Hypertension    Pt denies   Reflux    omeprazole 40mg    Patient Active Problem List   Diagnosis Date Noted   CAD (coronary artery disease) 06/21/2011    Priority: High   Aortic atherosclerosis (HCC) 12/17/2019    Priority: Medium   Insomnia 04/11/2019    Priority: Medium   Claudication of lower extremity (HCC) 10/24/2018    Priority: Medium   BPH (benign prostatic hyperplasia) 09/06/2016    Priority: Medium   Essential hypertension 08/14/2016    Priority: Medium   Hyperlipidemia 06/21/2011    Priority: Medium   Allergic rhinitis 01/28/2020    Priority: Low   Anxiety state 09/06/2016    Priority: Low   History of adenomatous polyp of colon 08/14/2016    Priority: Low   Vitamin D deficiency 08/14/2016    Priority: Low   GERD (gastroesophageal reflux disease)     Priority: Low   Cholelithiasis with chronic cholecystitis 01/12/2021   Plantar fasciitis of left foot 06/21/2018   Past Surgical History:  Procedure Laterality Date   Benign Cyst     on back   CARDIOVASCULAR STRESS TEST  05/09/2007   EF 64%   carpal tunnel left     CATARACT EXTRACTION     bilateral- april 15th with repeat 3 weeks later   CHOLECYSTECTOMY N/A 01/12/2021   Procedure: LAPAROSCOPIC  CHOLECYSTECTOMY WITH INTRAOPERATIVE CHOLANGIOGRAM;  Surgeon: Darnell LevelGerkin, Todd, MD;  Location: [facility];  Service: General;  Laterality: N/A;  90MIN/RM2   CORONARY ANGIOPLASTY WITH STENT PLACEMENT     CYSTECTOMY     removed from finger   cystoscopy     LIPOMA EXCISION     Back   right knee surgery     arthroscopyc 02/2010    Family History  Problem Relation Age of Onset   Bladder Cancer Mother        passed age 78.    Heart attack Father        [id number] passed from this   Alcohol abuse Father     Medications- reviewed and updated Current Outpatient Medications  Medication Sig Dispense Refill   aspirin 81 MG tablet Take 1 tablet (81 mg total) by mouth daily.     atorvastatin (LIPITOR) 40 MG tablet Take 1 tablet (40 mg total) by mouth daily. 90 tablet 3   Calcium Carbonate-Vitamin D (CALCIUM + D PO) Take 1 tablet by mouth daily.     fluticasone (FLONASE) 50 MCG/ACT nasal spray Place 2 sprays into both nostrils daily. (Patient taking differently: Place 2 sprays into both nostrils daily as needed for allergies.) 48 g 1   glucosamine-chondroitin 500-400 MG tablet Take 1 tablet by mouth 2 (two) times daily.     melatonin 5 MG TABS Take 5 mg by mouth at  bedtime.     metoprolol tartrate (LOPRESSOR) 25 MG tablet Take 1 tablet (25 mg total) by mouth 2 (two) times daily. 180 tablet 3   Multiple Vitamin (MULTIVITAMIN PO) Take 1 tablet by mouth daily.     Omega-3 Fatty Acids (FISH OIL) 1200 MG CAPS Take 1,200 mg by mouth daily.     traZODone (DESYREL) 50 MG tablet TAKE 1/2 TO 1 TABLET AT BEDTIME AS NEEDED FOR SLEEP 90 tablet 0   omeprazole (PRILOSEC) 40 MG capsule Take 1 capsule (40 mg total) by mouth daily. 90 capsule 3   tamsulosin (FLOMAX) 0.4 MG CAPS capsule Take 1 capsule (0.4 mg total) by mouth daily. 90 capsule 3   traMADol (ULTRAM) 50 MG tablet Take 1-2 tablets (50-100 mg total) by mouth every 6 (six) hours as needed. (Patient not taking: Reported on 04/08/2021) 15 tablet 0   No current  facility-administered medications for this visit.    Allergies-reviewed and updated Allergies  Allergen Reactions   Hydrocodone Nausea And Vomiting   Simvastatin Other (See Comments)    Leg pain     Social History   Social History Narrative   Married. Wife will be patient of Dr. [place]. 3 grown children, 3 grown stepkids. 9 grandkids.    Lives in [place] now      Retired from [employer]. Over 46 years.    Objective  Objective:  BP 124/71   Pulse 73   Temp 97.6 F (36.4 C) (Temporal)   Ht 5\' 8"  (1.727 m)   Wt 152 lb 9.6 oz (69.2 kg)   SpO2 99%   BMI 23.20 kg/m  Gen: NAD, resting comfortably Stable thyromegaly right > left- prior imaging with no discrete nodules HEENT: Mucous membranes are moist. Oropharynx normalg CV: RRR no murmurs rubs or gallops Lungs: CTAB no crackles, wheeze, rhonchi Abdomen: soft/nontender/nondistended/normal bowel sounds. No rebound or guarding.  Ext: no edema Skin: warm, dry Neuro: grossly normal, moves all extremities, PERRLA   Assessment and Plan  80 y.o. male presenting for annual physical.  Health Maintenance counseling: 1. Anticipatory guidance: Patient counseled regarding regular dental exams -q6 months, eye exams -yearly at least,   avoiding smoking and second hand smoke , limiting alcohol to 2 beverages per day - does not drink.   2. Risk factor reduction:  Advised patient of need for regular exercise and diet rich and fruits and vegetables to reduce risk of heart attack and stroke. Exercise- 3 days a week dwon from everyday after moving further out. Diet-appetite is back- had lost weight around time of gallbladder issues. Was 181 last year- we discussed if he has any further weight loss to follow up with 62 ASAP Wt Readings from Last 3 Encounters:  04/08/21 152 lb 9.6 oz (69.2 kg)  01/12/21 160 lb 3.2 oz (72.7 kg)  01/09/21 160 lb 3.2 oz (72.7 kg)   3. Immunizations/screenings/ancillary studies Immunization History   Administered Date(s) Administered   Fluad Quad(high Dose 65+) 06/13/2019, 06/24/2020   Influenza, High Dose Seasonal PF 08/28/2017   Influenza-Unspecified 07/28/2016   PFIZER(Purple Top)SARS-COV-2 Vaccination 10/15/2019, 11/04/2019, 07/11/2020   Pneumococcal Conjugate-13 08/16/2014   Pneumococcal Polysaccharide-23 07/08/2010   Tdap 07/08/2010   Health Maintenance Due  Topic Date Due   Hepatitis C Screening  Never done   Zoster Vaccines- Shingrix (1 of 2) Never done   COVID-19 Vaccine (4 - Booster for [employer] series) 10/11/2020   4. Prostate cancer screening-  no change in urinary symptoms- he would like to check  a PSA- we discussed current guidelines but he would prefer to still have thims- will order today Lab Results  Component Value Date   PSA 0.74 10/20/2017   5. Colon cancer screening - 09/16/16 with Dr. Elnoria Howard with 5 year repeat planned- will be due later this year 6. Skin cancer screening- no dermatologist. advised regular sunscreen use- states could be better. Denies worrisome, changing, or new skin lesions.  7. Never smoker 8. STD screening - opts out- only active with wife  Status of chronic or acute concerns   Since I have seen him last -gallbladder removed in April, reports lost a lot of weight around time of this- has been more stable lately- no longer losing. Appetite back and weight stabilized -cataract surgery -hip injection for bursitis last week -moved houses- downsized -with weight loss if still losing weight at this point encouraged follow up- would not consider unintentional undefined weight loss as had source with gallbladder issues  #CAD- sees Dr. Elease Hashimoto in september #hyperlipidemia #Aortic atherosclerosis-incidental finding #claudication of lower extremity- reported in past- states better- stairs bother him but no issues on flat ground S: Medication: Atorvastatin 40 mg daily, aspirin 81 mg  Patient denies chest pain or shortness of breath. No claudication  on flat ground  Lab Results  Component Value Date   CHOL 108 06/11/2020   HDL 41 06/11/2020   LDLCALC 54 06/11/2020   LDLDIRECT 74.0 09/07/2017   TRIG 60 06/11/2020   CHOLHDL 2.6 06/11/2020   A/P: For CAD (stable continue current meds as asymptomatic) and aortic atherosclerosis (stable continue current meds as asymptomatic)prefer LDL under 70-Lipids with excellent control last year-has not quite been a year since last lipids-offered direct LDL versus lipid panel and discuss potential cause of lipid panel-patient opts for just direct LDL   #hypertension- more for heart but also controls BP S: medication: metoprolol 25 mg BID BP Readings from Last 3 Encounters:  04/08/21 124/71  01/13/21 116/68  01/09/21 116/80  A/P: Stable. Continue current medications.    #BPH with nocturia- tamsulosin with stable symptoms. Wants PSA as above. Still with urgency, frequency, nocturia  #GERD- protonix 40mg  in past now on omeprazole 40 mgwith history of esophagitis- discussed reducing dose but Dr. has wanted him on this dose -discussed 20 mg trial but with GI opinion on this will continue the 40mg  and refilled  #anxiety/insomnia- paxil in past- doing ok without meds. Trazodone for sleep helpful- continue current meds  #Vitamin D deficiency S: Medication: calcium plus D plus MV Last vitamin D Lab Results  Component Value Date   VD25OH 49.19 10/30/2019  A/P: hopefully stable- update vitamin D today. Continue current meds for now   No recent change in size on thyromegaly 10/31/20  IMPRESSION: 1. Thyromegaly with nodular markedly heterogenous parenchyma, no discrete or dominant nodule or mass. - offered updated ultrasound but opts out for now  Recommended follow up: Return in about 6 months (around 10/09/2021) for follow up- or sooner if needed. Future Appointments  Date Time Provider Department Center  06/09/2021  9:20 AM Nahser, 12/07/2021, MD [id number]   Lab/Order  associations: NOT fasting   ICD-10-CM   1. Nocturia  R35.1 PSA    2. Encounter for hepatitis C screening test for low risk patient  Z11.59 Hepatitis C antibody    3. Vitamin D deficiency  E55.9 Vitamin D (25 hydroxy)    4. Hyperlipidemia, unspecified hyperlipidemia type  E78.5 CBC with Differential/Platelet    Comprehensive metabolic panel  LDL cholesterol, direct    TSH    5. Essential hypertension  I10 CBC with Differential/Platelet    Comprehensive metabolic panel    LDL cholesterol, direct    6. Coronary artery disease involving native coronary artery of native heart without angina pectoris  I25.10 CBC with Differential/Platelet    Comprehensive metabolic panel    LDL cholesterol, direct    7. Aortic atherosclerosis (HCC) Chronic I70.0       Meds ordered this encounter  Medications   tamsulosin (FLOMAX) 0.4 MG CAPS capsule    Sig: Take 1 capsule (0.4 mg total) by mouth daily.    Dispense:  90 capsule    Refill:  3   omeprazole (PRILOSEC) 40 MG capsule    Sig: Take 1 capsule (40 mg total) by mouth daily.    Dispense:  90 capsule    Refill:  3    Return precautions advised.  [name], MD

## 2021-04-09 LAB — HEPATITIS C ANTIBODY
Hepatitis C Ab: NONREACTIVE
SIGNAL TO CUT-OFF: 0.01 (ref <1.00)

## 2021-05-12 DIAGNOSIS — H40013 Open angle with borderline findings, low risk, bilateral: Secondary | ICD-10-CM | POA: Diagnosis not present

## 2021-06-08 ENCOUNTER — Encounter: Payer: Self-pay | Admitting: Cardiovascular Disease

## 2021-06-08 NOTE — Progress Notes (Signed)
Cardiology Office Note   Date:  06/09/2021   ID:  EBERT, FORRESTER 03-Dec-1940, MRN [id number]  PCP:  Shelva Majestic, MD  Cardiologist:   [name], MD   Chief Complaint  Patient presents with   Coronary Artery Disease   Hypertension   1. Coronary artery disease-status post PTCA and stenting of his left circumflex artery and right coronary artery 2. Hyperlipidemia    Previous notes:  80 year old gentleman with a history of coronary artery disease. He status post PTCA and stenting of the left circumflex artery as well as PTCA of  the posterior descending artery and January 2002. He's done very well. He has not had any episodes of chest pain or shortness of breath.  January 16, 2014:  Jeromiah feels well - no angina, no dyspnea.    He is watching his diet   January 21, 2015:  Bion Todorov II is a 80 y.o. male who presents for follow-up of his coronary artery disease. Still bowling  [facility]  Does some cardio.   January 21, 2016:  Rashawn is seen today for his CAD.    Has some muscle stiffness.  No muscle aches.  Has done some stretches.   January 27, 2017:  Having some muscle pull issus Having some ortho issues.  No CP  Is now changing Dr. [name] at [facility].  Has stopped his Niacin .   Still goes to the [facility].  Does light cardio and some machines.   March 17, 2018:  No CP , some dyspnea.   Especially with walking . Has some thigh pain with exercise.   Does not walk regularly  [facility].   Rides a bike, walks on treadmill , works out on [employer] , seated ellipitical   Aug. 9, 2021: Jourdon is seen for follow up of his CAD, HLD  Still very active. Bowls regularly  No CP , no dyspnea   Labs from Jan were reviewed  Wt today is 168.  Has lost weight on purpose - 25 lbs over the past 2 years.   Sept. 6, 2022: Nijee is seen today for follow up of his CAD, HLD, HTN. Has had cataract surgery ,  gall  bladder surgery   No angina ,      Past Medical History:  Diagnosis Date   Anxiety    Coronary artery disease    status post PTCA and stenting of his left circumflex artery and right coronary artery   GERD (gastroesophageal reflux disease)    H/O heart artery stent    Hyperlipidemia    Hypertension    Pt denies   Reflux    omeprazole 40mg     Past Surgical History:  Procedure Laterality Date   Benign Cyst     on back   CARDIOVASCULAR STRESS TEST  05/09/2007   EF 64%   carpal tunnel left     CATARACT EXTRACTION     bilateral- april 15th with repeat 3 weeks later   CHOLECYSTECTOMY N/A 01/12/2021   Procedure: LAPAROSCOPIC CHOLECYSTECTOMY WITH INTRAOPERATIVE CHOLANGIOGRAM;  Surgeon: 03/14/2021, MD;  Location: [facility];  Service: General;  Laterality: N/A;  90MIN/RM2   CORONARY ANGIOPLASTY WITH STENT PLACEMENT     CYSTECTOMY     removed from finger   cystoscopy     LIPOMA EXCISION     Back   right knee surgery  arthroscopyc 02/2010     Current Outpatient Medications  Medication Sig Dispense Refill   aspirin 81 MG tablet Take 1 tablet (81 mg total) by mouth daily.     atorvastatin (LIPITOR) 40 MG tablet Take 1 tablet (40 mg total) by mouth daily. 90 tablet 3   Calcium Carbonate-Vitamin D (CALCIUM + D PO) Take 1 tablet by mouth daily.     fluticasone (FLONASE) 50 MCG/ACT nasal spray Place 2 sprays into both nostrils daily. (Patient taking differently: Place 2 sprays into both nostrils daily as needed for allergies.) 48 g 1   glucosamine-chondroitin 500-400 MG tablet Take 1 tablet by mouth 2 (two) times daily.     melatonin 5 MG TABS Take 5 mg by mouth at bedtime.     metoprolol tartrate (LOPRESSOR) 25 MG tablet Take 1 tablet (25 mg total) by mouth 2 (two) times daily. 180 tablet 3   Multiple Vitamin (MULTIVITAMIN PO) Take 1 tablet by mouth daily.     Omega-3 Fatty Acids (FISH OIL) 1200 MG CAPS Take 1,200 mg by mouth daily.     omeprazole (PRILOSEC) 40 MG capsule Take 1  capsule (40 mg total) by mouth daily. 90 capsule 3   tamsulosin (FLOMAX) 0.4 MG CAPS capsule Take 1 capsule (0.4 mg total) by mouth daily. 90 capsule 3   traZODone (DESYREL) 50 MG tablet TAKE 1/2 TO 1 TABLET AT BEDTIME AS NEEDED FOR SLEEP 90 tablet 0   traMADol (ULTRAM) 50 MG tablet Take 1-2 tablets (50-100 mg total) by mouth every 6 (six) hours as needed. (Patient not taking: Reported on 06/09/2021) 15 tablet 0   No current facility-administered medications for this visit.    Allergies:   Hydrocodone and Simvastatin    Social History:  The patient  reports that he has never smoked. He has never used smokeless tobacco. He reports that he does not drink alcohol and does not use drugs.   Family History:  The patient's family history includes Alcohol abuse in his father; Bladder Cancer in his mother; Heart attack in his father.    ROS: Noted in current history, otherwise review of systems is negative.   Physical Exam: Blood pressure 122/68, pulse 71, height 5\' 8"  (1.727 m), weight 154 lb 9.6 oz (70.1 kg), SpO2 99 %.  GEN:  Well nourished, well developed in no acute distress HEENT: Normal NECK: No JVD; No carotid bruits LYMPHATICS: No lymphadenopathy CARDIAC: RRR , no murmurs, rubs, gallops RESPIRATORY:  Clear to auscultation without rales, wheezing or rhonchi  ABDOMEN: Soft, non-tender, non-distended MUSCULOSKELETAL:  No edema; No deformity  SKIN: Warm and dry NEUROLOGIC:  Alert and oriented x 3   EKG:     Sept. 6, 2022:   NSR at 71,  no ST or T wave changes   Recent Labs: 04/08/2021: ALT 18; BUN 19; Creatinine, Ser 0.76; Hemoglobin 14.5; Platelets 240.0; Potassium 4.2; Sodium 136; TSH 0.36    Lipid Panel    Component Value Date/Time   CHOL 108 06/11/2020 0948   TRIG 60 06/11/2020 0948   HDL 41 06/11/2020 0948   CHOLHDL 2.6 06/11/2020 0948   CHOLHDL 3 10/30/2019 1105   VLDL 14.2 10/30/2019 1105   LDLCALC 54 06/11/2020 0948   LDLDIRECT 58.0 04/08/2021 1140      Wt  Readings from Last 3 Encounters:  06/09/21 154 lb 9.6 oz (70.1 kg)  04/08/21 152 lb 9.6 oz (69.2 kg)  01/12/21 160 lb 3.2 oz (72.7 kg)      Other studies  Reviewed: Additional studies/ records that were reviewed today include: . Review of the above records demonstrates:    ASSESSMENT AND PLAN:  1. Coronary artery disease-  no angina    2. Hyperlipidemia -    we will check lipids and ALT today.  Continue current medications.    Current medicines are reviewed at length with the patient today.  The patient does not have concerns regarding medicines.  The following changes have been made:  no change  Labs/ tests ordered today include:   Orders Placed This Encounter  Procedures   ALT   Lipid Profile   EKG 12-Lead      Disposition:         Signed, [name], MD  06/09/2021 10:31 AM    [facility] [address] [place], [place], [place]  [address] Phone: [phone]; Fax: [phone]

## 2021-06-09 ENCOUNTER — Other Ambulatory Visit: Payer: Self-pay

## 2021-06-09 ENCOUNTER — Encounter: Payer: Self-pay | Admitting: Cardiovascular Disease

## 2021-06-09 ENCOUNTER — Ambulatory Visit: Payer: Medicare HMO | Admitting: Cardiovascular Disease

## 2021-06-09 VITALS — BP 122/68 | HR 71 | Ht 68.0 in | Wt 154.6 lb

## 2021-06-09 DIAGNOSIS — E785 Hyperlipidemia, unspecified: Secondary | ICD-10-CM

## 2021-06-09 DIAGNOSIS — I2510 Atherosclerotic heart disease of native coronary artery without angina pectoris: Secondary | ICD-10-CM | POA: Diagnosis not present

## 2021-06-09 LAB — ALT: ALT: 18 IU/L (ref 0–44)

## 2021-06-09 LAB — LIPID PANEL
Chol/HDL Ratio: 2.3 ratio (ref 0.0–5.0)
Cholesterol, Total: 110 mg/dL (ref 100–199)
HDL: 47 mg/dL (ref >39)
LDL Chol Calc (NIH): 51 mg/dL (ref 0–99)
Triglycerides: 51 mg/dL (ref 0–149)
VLDL Cholesterol Cal: 12 mg/dL (ref 5–40)

## 2021-06-09 NOTE — Patient Instructions (Signed)
Medication Instructions:  Your physician recommends that you continue on your current medications as directed. Please refer to the Current Medication list given to you today.  *If you need a refill on your cardiac medications before your next appointment, please call your pharmacy*   Lab Work: TODAY - Lipid, ALT If you have labs (blood work) drawn today and your tests are completely normal, you will receive your results only by: MyChart Message (if you have MyChart) OR A paper copy in the mail If you have any lab test that is abnormal or we need to change your treatment, we will call you to review the results.   Testing/Procedures: None Ordered   Follow-Up: At [facility], you and your health needs are our priority.  As part of our continuing mission to provide you with exceptional heart care, we have created designated Provider Care Teams.  These Care Teams include your primary Cardiologist (physician) and Advanced Practice Providers (APPs -  Physician Assistants and Nurse Practitioners) who all work together to provide you with the care you need, when you need it.   Your next appointment:   1 year(s)  The format for your next appointment:   In Person  Provider:   You may see [name], MD or one of the following Advanced Practice Providers on your designated Care Team:   Tereso Newcomer, PA-C [place], [place]

## 2021-06-10 ENCOUNTER — Telehealth: Payer: Self-pay | Admitting: *Deleted

## 2021-06-10 MED ORDER — ATORVASTATIN CALCIUM 40 MG PO TABS: 40.0000 mg | ORAL_TABLET | Freq: Every day | ORAL | 2 refills | Status: DC

## 2021-06-10 MED ORDER — METOPROLOL TARTRATE 25 MG PO TABS: 25.0000 mg | ORAL_TABLET | Freq: Two times a day (BID) | ORAL | 2 refills | Status: DC

## 2021-06-10 NOTE — Telephone Encounter (Signed)
-----   Message from Vesta Mixer, MD sent at 06/10/2021 10:43 AM EDT ----- Labs are stable

## 2021-06-10 NOTE — Telephone Encounter (Signed)
The patient has been notified of the result and verbalized understanding.  All questions (if any) were answered. Loa Socks, LPN 06/12/3569 17:79 PM   Pt did ask for a refill of both his metoprolol and atorvastatin to be sent to his mail order [facility], for 90 day supply.  Sent both scripts in for the pt.  Pt verbalized understanding and agrees with this plan.  Pt was more than gracious for all the assistance provided.

## 2021-07-28 DIAGNOSIS — M5416 Radiculopathy, lumbar region: Secondary | ICD-10-CM | POA: Diagnosis not present

## 2021-07-28 DIAGNOSIS — R030 Elevated blood-pressure reading, without diagnosis of hypertension: Secondary | ICD-10-CM | POA: Diagnosis not present

## 2021-07-28 DIAGNOSIS — M48062 Spinal stenosis, lumbar region with neurogenic claudication: Secondary | ICD-10-CM | POA: Diagnosis not present

## 2021-08-05 DIAGNOSIS — M5416 Radiculopathy, lumbar region: Secondary | ICD-10-CM | POA: Diagnosis not present

## 2021-08-18 ENCOUNTER — Other Ambulatory Visit: Payer: Self-pay

## 2021-08-18 ENCOUNTER — Ambulatory Visit: Payer: Medicare HMO

## 2021-08-18 ENCOUNTER — Ambulatory Visit (INDEPENDENT_AMBULATORY_CARE_PROVIDER_SITE_OTHER): Payer: Medicare HMO

## 2021-08-18 DIAGNOSIS — Z23 Encounter for immunization: Secondary | ICD-10-CM

## 2021-09-18 DIAGNOSIS — Z23 Encounter for immunization: Secondary | ICD-10-CM | POA: Diagnosis not present

## 2021-10-08 NOTE — Patient Instructions (Addendum)
Health Maintenance Due  Topic Date Due   Zoster Vaccines- Shingrix (1 of 2) -Please check with your pharmacy to see if they have the shingrix vaccine. If they do- please get this immunization and update [place] by phone call or mychart with dates you receive the vaccine.  Never done   COLONOSCOPY  - Sign release of information at the check out desk for last colonoscopy history from Dr. Elnoria Howard  09/16/2021   We will plan to have your labs done in your 63-month follow-up.  Please try to increase your hydration. If you find you find yourself having more dizzy spells/feeling lightheaded try to remain seated for a few minutes before changing positions then rise slowly. Let us know if persistent issues  No changes in your daily regimen today - continue all of your current medications.  Recommended follow up: You may keep your July visit.

## 2021-10-08 NOTE — Progress Notes (Signed)
Phone [phone] In person visit   Subjective:   [name] [name] is a 81 y.o. year old very pleasant male patient who presents for/with See problem oriented charting Chief Complaint  Patient presents with   Follow-up    This visit occurred during the SARS-CoV-2 public health emergency.  Safety protocols were in place, including screening questions prior to the visit, additional usage of staff PPE, and extensive cleaning of exam room while observing appropriate contact time as indicated for disinfecting solutions.   Past Medical History-  Patient Active Problem List   Diagnosis Date Noted   CAD (coronary artery disease) 06/21/2011    Priority: High   Aortic atherosclerosis (HCC) 12/17/2019    Priority: Medium    Insomnia 04/11/2019    Priority: Medium    Claudication of lower extremity (HCC) 10/24/2018    Priority: Medium    BPH (benign prostatic hyperplasia) 09/06/2016    Priority: Medium    Essential hypertension 08/14/2016    Priority: Medium    Hyperlipidemia 06/21/2011    Priority: Medium    Allergic rhinitis 01/28/2020    Priority: Low   Anxiety state 09/06/2016    Priority: Low   History of adenomatous polyp of colon 08/14/2016    Priority: Low   Vitamin D deficiency 08/14/2016    Priority: Low   GERD (gastroesophageal reflux disease)     Priority: Low   Thyromegaly 04/08/2021   Cholelithiasis with chronic cholecystitis 01/12/2021   Plantar fasciitis of left foot 06/21/2018    Medications- reviewed and updated Current Outpatient Medications  Medication Sig Dispense Refill   aspirin 81 MG tablet Take 1 tablet (81 mg total) by mouth daily.     atorvastatin (LIPITOR) 40 MG tablet Take 1 tablet (40 mg total) by mouth daily. 90 tablet 2   Calcium Carbonate-Vitamin D (CALCIUM + D PO) Take 1 tablet by mouth daily.     fluticasone (FLONASE) 50 MCG/ACT nasal spray Place 2 sprays into both nostrils daily. (Patient taking differently: Place 2 sprays into both  nostrils daily as needed for allergies.) 48 g 1   glucosamine-chondroitin 500-400 MG tablet Take 1 tablet by mouth 2 (two) times daily.     melatonin 5 MG TABS Take 5 mg by mouth at bedtime.     metoprolol tartrate (LOPRESSOR) 25 MG tablet Take 1 tablet (25 mg total) by mouth 2 (two) times daily. 180 tablet 2   Multiple Vitamin (MULTIVITAMIN PO) Take 1 tablet by mouth daily.     Omega-3 Fatty Acids (FISH OIL) 1200 MG CAPS Take 1,200 mg by mouth daily.     omeprazole (PRILOSEC) 40 MG capsule Take 1 capsule (40 mg total) by mouth daily. 90 capsule 3   tamsulosin (FLOMAX) 0.4 MG CAPS capsule Take 1 capsule (0.4 mg total) by mouth daily. 90 capsule 3   traZODone (DESYREL) 50 MG tablet TAKE 1/2 TO 1 TABLET AT BEDTIME AS NEEDED FOR SLEEP 90 tablet 0   No current facility-administered medications for this visit.     Objective:  BP 124/62    Pulse 65    Temp (!) 97.3 F (36.3 C) (Temporal)    Ht 5\' 8"  (1.727 m)    Wt 163 lb 12.8 oz (74.3 kg)    SpO2 99%    BMI 24.91 kg/m  Gen: NAD, resting comfortably CV: RRR no murmurs rubs or gallops Lungs: CTAB no crackles, wheeze, rhonchi Abdomen: soft/nontender/nondistended/normal bowel sounds. Well healing scars Ext: no edema Skin: warm, dry  Assessment and Plan  #Social Update-has enjoyed his new home/downsizing - weight back up after prior surgery suppressed appetite - still enjoying bowling (but states not as well) 3 days a week -ymca has cut back some- encouraged to increase  #Radiculopathy- another injection about a month ago and his back with some improvement.    #CAD- sees Dr. Elease Hashimoto in September of each year typically-last seen on 622 #hyperlipidemia #Aortic atherosclerosis-incidental finding S: Medication:Compliant with Atorvastatin 40 mg daily, aspirin 81 mg.  Also metoprolol   No chest pain or shortness of breath reported.  No claudication on flat ground- no recent issues with stairs either- had previously  Lab Results  Component  Value Date   CHOL 110 06/09/2021   HDL 47 06/09/2021   LDLCALC 51 06/09/2021   LDLDIRECT 58.0 04/08/2021   TRIG 51 06/09/2021   CHOLHDL 2.3 06/09/2021  A/P: For CAD-asymptomatic-continue cardiology follow-up For aortic atherosclerosis-suspect stable.  LDL goal 70 or less and has been at goal For Lipid-well-controlled on last check-too soon for full repeat- continue current medicine  #hypertension - more for heart but also controls BP S: medication: Compliant with metoprolol 25 mg BID Home readings #s: does not check BP Readings from Last 3 Encounters:  10/13/21 124/62  06/09/21 122/68  04/08/21 124/71  A/P:  Controlled. Continue current medications.   -one dizzy spell last week with standing up- encouraged staying well hydrated. Actually had a fall but only mild hip pain.   # BPH with nocturia-  tamsulosin with stable symptoms. Still has urgency, frequency, nocturia-no recent changes. We did discuss that tamsulosin could contribute to urinary symptoms  #GERD- protonix 40mg  in past now on omeprazole 40 mg with history of esophagitis- discussed reducing dose but Dr. has wanted him on this dose -discussed 20 mg trial but with GI opinion on this will continue the 40mg  and refilled   #anxiety/insomnia- paxil in past- doing ok without meds. Trazodone for sleep helpful- continue current meds- half tablet mainly . Also prefers to take melatonin  #Vitamin D deficiency S: Medication: calcium plus D plus MV Last vitamin D Lab Results  Component Value Date   VD25OH 57.30 04/08/2021  A/P: Well-controlled on last visit-continue medication and recheck next visit  No recent change in size on thyromegaly 10/31/18 IMPRESSION: 1. Thyromegaly with nodular markedly heterogenous parenchyma, no discrete or dominant nodule or mass. Lab Results  Component Value Date   TSH 0.36 04/08/2021  - offered updated ultrasound last visit and again today but patient opted out -TSH will be checked  annually  #HM- he reports colonoscopy after 2020- we will attempt to get records -due Tdap pharmacy -due shingrix pharmacy  Recommended follow up: Keep July physical Future Appointments  Date Time Provider Department Center  04/13/2022  8:20 AM August, MD [facility]    Lab/Order associations:   ICD-10-CM   1. Essential hypertension  I10     2. Hyperlipidemia, unspecified hyperlipidemia type  E78.5     3. Coronary artery disease involving native coronary artery of native heart without angina pectoris  I25.10     4. Aortic atherosclerosis (HCC)  I70.0     5. Vitamin D deficiency  E55.9     6. Benign prostatic hyperplasia with lower urinary tract symptoms, symptom details unspecified  N40.1     7. Thyromegaly  E01.0       No orders of the defined types were placed in this encounter.  I,Harris Phan,acting as a 06/14/2022 for  [name], MD.,have documented all relevant documentation on the behalf of [name], MD,as directed by  [name], MD while in the presence of [name], MD.   I, [name], MD, have reviewed all documentation for this visit. The documentation on 10/13/21 for the exam, diagnosis, procedures, and orders are all accurate and complete.   Return precautions advised.  [name], MD

## 2021-10-13 ENCOUNTER — Encounter: Payer: Self-pay | Admitting: Family Medicine

## 2021-10-13 ENCOUNTER — Ambulatory Visit (INDEPENDENT_AMBULATORY_CARE_PROVIDER_SITE_OTHER): Payer: Medicare HMO | Admitting: Family Medicine

## 2021-10-13 ENCOUNTER — Other Ambulatory Visit: Payer: Self-pay

## 2021-10-13 VITALS — BP 124/62 | HR 65 | Temp 97.3°F | Ht 68.0 in | Wt 163.8 lb

## 2021-10-13 DIAGNOSIS — I700 Atherosclerosis of aorta: Secondary | ICD-10-CM | POA: Diagnosis not present

## 2021-10-13 DIAGNOSIS — I2510 Atherosclerotic heart disease of native coronary artery without angina pectoris: Secondary | ICD-10-CM | POA: Diagnosis not present

## 2021-10-13 DIAGNOSIS — E559 Vitamin D deficiency, unspecified: Secondary | ICD-10-CM

## 2021-10-13 DIAGNOSIS — E010 Iodine-deficiency related diffuse (endemic) goiter: Secondary | ICD-10-CM

## 2021-10-13 DIAGNOSIS — E785 Hyperlipidemia, unspecified: Secondary | ICD-10-CM

## 2021-10-13 DIAGNOSIS — I10 Essential (primary) hypertension: Secondary | ICD-10-CM

## 2021-10-13 DIAGNOSIS — N401 Enlarged prostate with lower urinary tract symptoms: Secondary | ICD-10-CM

## 2021-12-17 DIAGNOSIS — H52223 Regular astigmatism, bilateral: Secondary | ICD-10-CM | POA: Diagnosis not present

## 2021-12-17 DIAGNOSIS — H2510 Age-related nuclear cataract, unspecified eye: Secondary | ICD-10-CM | POA: Diagnosis not present

## 2022-02-13 ENCOUNTER — Other Ambulatory Visit: Payer: Self-pay | Admitting: Family Medicine

## 2022-02-13 ENCOUNTER — Other Ambulatory Visit: Payer: Self-pay | Admitting: Cardiovascular Disease

## 2022-03-05 ENCOUNTER — Ambulatory Visit (INDEPENDENT_AMBULATORY_CARE_PROVIDER_SITE_OTHER): Payer: Medicare HMO

## 2022-03-05 DIAGNOSIS — Z1211 Encounter for screening for malignant neoplasm of colon: Secondary | ICD-10-CM | POA: Diagnosis not present

## 2022-03-05 DIAGNOSIS — Z0000 Encounter for general adult medical examination without abnormal findings: Secondary | ICD-10-CM

## 2022-03-05 DIAGNOSIS — Z86010 Personal history of colonic polyps: Secondary | ICD-10-CM | POA: Diagnosis not present

## 2022-03-05 NOTE — Progress Notes (Signed)
Virtual Visit via Telephone Note  I connected with  Brookes Craine II on 03/05/22 at  8:00 AM EDT by telephone and verified that I am speaking with the correct person using two identifiers.  Medicare Annual Wellness visit completed telephonically due to Covid-19 pandemic.   Persons participating in this call: This Health Coach and this patient.   Location: Patient: Home Provider: Office    I discussed the limitations, risks, security and privacy concerns of performing an evaluation and management service by telephone and the availability of in person appointments. The patient expressed understanding and agreed to proceed.  Unable to perform video visit due to video visit attempted and failed and/or patient does not have video capability.   Some vital signs may be absent or patient reported.   Marzella Schlein, LPN   Subjective:   Alyaan Budzynski II is a 81 y.o. male who presents for Medicare Annual/Subsequent preventive examination.  Review of Systems     Cardiac Risk Factors include: advanced age (>28men, >3 women);male gender;hypertension;dyslipidemia     Objective:    There were no vitals filed for this visit. There is no height or weight on file to calculate BMI.     03/05/2022    8:03 AM 01/12/2021   11:21 AM 01/09/2021    9:34 AM 01/28/2020   12:36 PM 05/14/2019    9:11 AM 03/02/2018   10:13 AM  Advanced Directives  Does Patient Have a Medical Advance Directive? Yes Yes Yes Yes No No  Type of Estate agent of [place];Living will Healthcare Power of [place];Living will Healthcare Power of [place];Living will Living will;Healthcare Power of Attorney    Does patient want to make changes to medical advance directive?  No - Patient declined  No - Patient declined    Copy of Healthcare Power of Attorney in Chart? No - copy requested No - copy requested No - copy requested No - copy requested    Would patient like information on creating a medical  advance directive?     Yes (MAU/Ambulatory/Procedural Areas - Information given)     Current Medications (verified) Outpatient Encounter Medications as of 03/05/2022  Medication Sig   aspirin 81 MG tablet Take 1 tablet (81 mg total) by mouth daily.   atorvastatin (LIPITOR) 40 MG tablet Take 1 tablet (40 mg total) by mouth daily. Call and schedule one year follow up appointment   Calcium Carbonate-Vitamin D (CALCIUM + D PO) Take 1 tablet by mouth daily.   fluticasone (FLONASE) 50 MCG/ACT nasal spray Place 2 sprays into both nostrils daily. (Patient taking differently: Place 2 sprays into both nostrils daily as needed for allergies.)   glucosamine-chondroitin 500-400 MG tablet Take 1 tablet by mouth 2 (two) times daily.   melatonin 5 MG TABS Take 5 mg by mouth at bedtime.   metoprolol tartrate (LOPRESSOR) 25 MG tablet Take 1 tablet (25 mg total) by mouth 2 (two) times daily. Call and schedule one year follow up appointment   Multiple Vitamin (MULTIVITAMIN PO) Take 1 tablet by mouth daily.   Omega-3 Fatty Acids (FISH OIL) 1200 MG CAPS Take 1,200 mg by mouth daily.   omeprazole (PRILOSEC) 40 MG capsule Take 1 capsule (40 mg total) by mouth daily.   tamsulosin (FLOMAX) 0.4 MG CAPS capsule Take 1 capsule (0.4 mg total) by mouth daily.   traZODone (DESYREL) 50 MG tablet TAKE 1/2 TO 1 TABLET AT BEDTIME AS NEEDED FOR SLEEP   No facility-administered encounter medications on  file as of 03/05/2022.    Allergies (verified) Hydrocodone and Simvastatin   History: Past Medical History:  Diagnosis Date   Anxiety    Coronary artery disease    status post PTCA and stenting of his left circumflex artery and right coronary artery   GERD (gastroesophageal reflux disease)    H/O heart artery stent    Hyperlipidemia    Hypertension    Pt denies   Reflux    omeprazole 40mg    Past Surgical History:  Procedure Laterality Date   Benign Cyst     on back   CARDIOVASCULAR STRESS TEST  05/09/2007   EF 64%    carpal tunnel left     CATARACT EXTRACTION     bilateral- april 15th with repeat 3 weeks later   CHOLECYSTECTOMY N/A 01/12/2021   Procedure: LAPAROSCOPIC CHOLECYSTECTOMY WITH INTRAOPERATIVE CHOLANGIOGRAM;  Surgeon: Darnell LevelGerkin, Todd, MD;  Location: [facility];  Service: General;  Laterality: N/A;  90MIN/RM2   CORONARY ANGIOPLASTY WITH STENT PLACEMENT     CYSTECTOMY     removed from finger   cystoscopy     LIPOMA EXCISION     Back   right knee surgery     arthroscopyc 02/2010   Family History  Problem Relation Age of Onset   Bladder Cancer Mother        passed age 79.    Heart attack Father        [id number] passed from this   Alcohol abuse Father    Social History   Socioeconomic History   Marital status: Widowed    Spouse name: Not on file   Number of children: Not on file   Years of education: Not on file   Highest education level: Not on file  Occupational History   Not on file  Tobacco Use   Smoking status: Never   Smokeless tobacco: Never  Vaping Use   Vaping Use: Never used  Substance and Sexual Activity   Alcohol use: No   Drug use: No   Sexual activity: Not on file  Other Topics Concern   Not on file  Social History Narrative   Married. Wife will be patient of Dr. [place]. 3 grown children, 3 grown stepkids. 9 grandkids.    [place] in [place] now      Retired from [employer] service. Over 46 years.    Social Determinants of Health   Financial Resource Strain: Low Risk    Difficulty of Paying Living Expenses: Not hard at all  Food Insecurity: No Food Insecurity   Worried About Programme researcher, broadcasting/film/videounning Out of Food in the Last Year: Never true   Ran Out of Food in the Last Year: Never true  Transportation Needs: No Transportation Needs   Lack of Transportation (Medical): No   Lack of Transportation (Non-Medical): No  Physical Activity: Sufficiently Active   Days of Exercise per Week: 3 days   Minutes of Exercise per Session: 90 min  Stress: No Stress Concern Present   Feeling of Stress  : Not at all  Social Connections: Socially Isolated   Frequency of Communication with Friends and Family: More than three times a week   Frequency of Social Gatherings with Friends and Family: More than three times a week   Attends Religious Services: Never   Database administratorActive Member of Clubs or Organizations: No   Attends BankerClub or Organization Meetings: Never   Marital Status: Widowed    Tobacco Counseling Counseling given: Not Answered   Clinical Intake:  Pre-visit  preparation completed: Yes  Pain : No/denies pain     BMI - recorded: 24.91 Nutritional Status: BMI of 19-24  Normal Nutritional Risks: None Diabetes: No  How often do you need to have someone help you when you read instructions, pamphlets, or other written materials from your doctor or pharmacy?: 1 - Never  Diabetic?no  Interpreter Needed?: No  Information entered by :: Lanier Ensign, LPN   Activities of Daily Living    03/05/2022    8:05 AM  In your present state of health, do you have any difficulty performing the following activities:  Hearing? 0  Vision? 0  Difficulty concentrating or making decisions? 0  Walking or climbing stairs? 0  Dressing or bathing? 0  Doing errands, shopping? 0  Preparing Food and eating ? N  Using the Toilet? N  In the past six months, have you accidently leaked urine? N  Do you have problems with loss of bowel control? N  Managing your Medications? N  Managing your Finances? N  Housekeeping or managing your Housekeeping? N    Patient Care Team: Shelva Majestic, MD as PCP - General (Family Medicine) Nahser, Deloris Ping, MD as PCP - Cardiology (Cardiology) Blima Ledger, OD as Consulting Physician (Optometry) Callie Fielding, MD as Consulting Physician (Physical Medicine and Rehabilitation) Rene Paci, MD as Consulting Physician (Urology) Jeani Hawking, MD as Consulting Physician (Gastroenterology)  Indicate any recent Medical Services you may have received  from other than [facility] providers in the past year (date may be approximate).     Assessment:   This is a routine wellness examination for Assad.  Hearing/Vision screen Hearing Screening - Comments:: Pt denies any hearing issues  Vision Screening - Comments:: Pt follows up with Dr Blima Ledger for annual eye exams   Dietary issues and exercise activities discussed: Current Exercise Habits: Structured exercise class, Type of exercise: Other - see comments (bowling), Time (Minutes): > 60, Frequency (Times/Week): 3, Weekly Exercise (Minutes/Week): 0   Goals Addressed             This Visit's Progress    Patient Stated       Get back to exercise        Depression Screen    03/05/2022    8:02 AM 04/08/2021   10:22 AM 01/28/2020   12:37 PM 04/11/2019   10:00 AM 03/02/2018   10:05 AM 09/07/2017    8:25 AM 07/28/2016    9:58 AM  PHQ 2/9 Scores  PHQ - 2 Score 0 0 0 0 0 0 0    Fall Risk    03/05/2022    8:04 AM 04/08/2021   10:22 AM 01/28/2020   12:37 PM 01/28/2020   10:44 AM 03/02/2018   10:05 AM  Fall Risk   Falls in the past year? 1 0 0 0 No  Number falls in past yr: 1 1 0 0   Injury with Fall? 1 0 0 0   Comment bruised hip      Risk for fall due to : Impaired vision;Impaired balance/gait No Fall Risks     Follow up Falls prevention discussed Falls evaluation completed Falls evaluation completed;Education provided;Falls prevention discussed      FALL RISK PREVENTION PERTAINING TO THE HOME:  Any stairs in or around the home? Yes  If so, are there any without handrails? no Home free of loose throw rugs in walkways, pet beds, electrical cords, etc? Yes  Adequate lighting in your home to reduce risk  of falls? Yes   ASSISTIVE DEVICES UTILIZED TO PREVENT FALLS:  Life alert? No  Use of a cane, walker or w/c? No  Grab bars in the bathroom? Yes  Shower chair or bench in shower? Yes  Elevated toilet seat or a handicapped toilet? No   TIMED UP AND GO:  Was the test performed? No .    Cognitive Function:        03/05/2022    8:06 AM 01/28/2020   12:37 PM 05/14/2019    9:13 AM  6CIT Screen  What Year? 0 points 0 points 0 points  What month? 0 points 0 points 0 points  What time? 0 points 0 points 0 points  Count back from 20 0 points 0 points 0 points  Months in reverse 0 points 0 points 0 points  Repeat phrase 0 points 0 points 0 points  Total Score 0 points 0 points 0 points    Immunizations Immunization History  Administered Date(s) Administered   Fluad Quad(high Dose 65+) 06/13/2019, 06/24/2020, 08/18/2021   Influenza, High Dose Seasonal PF 08/28/2017   Influenza-Unspecified 07/28/2016   PFIZER(Purple Top)SARS-COV-2 Vaccination 10/15/2019, 11/04/2019, 07/11/2020   Pfizer Covid-19 Vaccine Bivalent Booster 73yrs & up 09/15/2021   Pneumococcal Conjugate-13 08/16/2014   Pneumococcal Polysaccharide-23 07/08/2010   Tdap 07/08/2010    TDAP status: Due, Education has been provided regarding the importance of this vaccine. Advised may receive this vaccine at local pharmacy or Health Dept. Aware to provide a copy of the vaccination record if obtained from local pharmacy or Health Dept. Verbalized acceptance and understanding.  Flu Vaccine status: Up to date  Pneumococcal vaccine status: Up to date  Covid-19 vaccine status: Completed vaccines  Qualifies for Shingles Vaccine? Yes   Zostavax completed No   Shingrix Completed?: No.    Education has been provided regarding the importance of this vaccine. Patient has been advised to call insurance company to determine out of pocket expense if they have not yet received this vaccine. Advised may also receive vaccine at local pharmacy or Health Dept. Verbalized acceptance and understanding.  Screening Tests Health Maintenance  Topic Date Due   Zoster Vaccines- Shingrix (1 of 2) Never done   TETANUS/TDAP  07/08/2020   COLONOSCOPY (Pts 45-10yrs Insurance coverage will need to be confirmed)  09/16/2021   INFLUENZA  VACCINE  05/04/2022   Pneumonia Vaccine 65+ Years old  Completed   COVID-19 Vaccine  Completed   Hepatitis C Screening  Completed   HPV VACCINES  Aged Out    Health Maintenance  Health Maintenance Due  Topic Date Due   Zoster Vaccines- Shingrix (1 of 2) Never done   TETANUS/TDAP  07/08/2020   COLONOSCOPY (Pts 45-14yrs Insurance coverage will need to be confirmed)  09/16/2021    Colorectal cancer screening: Referral to GI placed 03/05/22. Pt aware the office will call re: appt.   Additional Screening:  Hepatitis C Screening:  Completed 04/08/21  Vision Screening: Recommended annual ophthalmology exams for early detection of glaucoma and other disorders of the eye. Is the patient up to date with their annual eye exam?  Yes  Who is the provider or what is the name of the office in which the patient attends annual eye exams? Dr Kennon Rounds  If pt is not established with a provider, would they like to be referred to a provider to establish care? No .   Dental Screening: Recommended annual dental exams for proper oral hygiene  Community Resource Referral / Chronic Care Management:  CRR required this visit?  No   CCM required this visit?  No      Plan:     I have personally reviewed and noted the following in the patient's chart:   Medical and social history Use of alcohol, tobacco or illicit drugs  Current medications and supplements including opioid prescriptions. Patient is not currently taking opioid prescriptions. Functional ability and status Nutritional status Physical activity Advanced directives List of other physicians Hospitalizations, surgeries, and ER visits in previous 12 months Vitals Screenings to include cognitive, depression, and falls Referrals and appointments  In addition, I have reviewed and discussed with patient certain preventive protocols, quality metrics, and best practice recommendations. A written personalized care plan for preventive services as well  as general preventive health recommendations were provided to patient.     Marzella Schlein, LPN   03/06/1496   Nurse Notes: None

## 2022-03-05 NOTE — Patient Instructions (Signed)
[name] [name] , Thank you for taking time to come for your Medicare Wellness Visit. I appreciate your ongoing commitment to your health goals. Please review the following plan we discussed and let me know if I can assist you in the future.   Screening recommendations/referrals: Colonoscopy: order placed 03/05/22 Recommended yearly ophthalmology/optometry visit for glaucoma screening and checkup Recommended yearly dental visit for hygiene and checkup  Vaccinations: Influenza vaccine: Done 08/18/21 repeat every year  Pneumococcal vaccine: Up to date Tdap vaccine: Due and discussed  Shingles vaccine: Shingrix discussed. Please contact your pharmacy for coverage information.    Covid-19: Completed 1/11, 1/31, 07/11/20 & 09/15/21  Advanced directives: Please bring a copy of your health care power of attorney and living will to the office at your convenience.  Conditions/risks identified: Get back to exercise   Next appointment: Follow up in one year for your annual wellness visit.   Preventive Care 29 Years and Older, Male Preventive care refers to lifestyle choices and visits with your health care provider that can promote health and wellness. What does preventive care include? A yearly physical exam. This is also called an annual well check. Dental exams once or twice a year. Routine eye exams. Ask your health care provider how often you should have your eyes checked. Personal lifestyle choices, including: Daily care of your teeth and gums. Regular physical activity. Eating a healthy diet. Avoiding tobacco and drug use. Limiting alcohol use. Practicing safe sex. Taking low doses of aspirin every day. Taking vitamin and mineral supplements as recommended by your health care provider. What happens during an annual well check? The services and screenings done by your health care provider during your annual well check will depend on your age, overall health, lifestyle risk factors, and family  history of disease. Counseling  Your health care provider may ask you questions about your: Alcohol use. Tobacco use. Drug use. Emotional well-being. Home and relationship well-being. Sexual activity. Eating habits. History of falls. Memory and ability to understand (cognition). Work and work Astronomer. Screening  You may have the following tests or measurements: Height, weight, and BMI. Blood pressure. Lipid and cholesterol levels. These may be checked every 5 years, or more frequently if you are over 53 years old. Skin check. Lung cancer screening. You may have this screening every year starting at age 57 if you have a 30-pack-year history of smoking and currently smoke or have quit within the past 15 years. Fecal occult blood test (FOBT) of the stool. You may have this test every year starting at age 67. Flexible sigmoidoscopy or colonoscopy. You may have a sigmoidoscopy every 5 years or a colonoscopy every 10 years starting at age 49. Prostate cancer screening. Recommendations will vary depending on your family history and other risks. Hepatitis C blood test. Hepatitis B blood test. Sexually transmitted disease (STD) testing. Diabetes screening. This is done by checking your blood sugar (glucose) after you have not eaten for a while (fasting). You may have this done every 1-3 years. Abdominal aortic aneurysm (AAA) screening. You may need this if you are a current or former smoker. Osteoporosis. You may be screened starting at age 51 if you are at high risk. Talk with your health care provider about your test results, treatment options, and if necessary, the need for more tests. Vaccines  Your health care provider may recommend certain vaccines, such as: Influenza vaccine. This is recommended every year. Tetanus, diphtheria, and acellular pertussis (Tdap, Td) vaccine. You may need a  Td booster every 10 years. Zoster vaccine. You may need this after age 79. Pneumococcal  13-valent conjugate (PCV13) vaccine. One dose is recommended after age 79. Pneumococcal polysaccharide (PPSV23) vaccine. One dose is recommended after age 79. Talk to your health care provider about which screenings and vaccines you need and how often you need them. This information is not intended to replace advice given to you by your health care provider. Make sure you discuss any questions you have with your health care provider. Document Released: 10/17/2015 Document Revised: 06/09/2016 Document Reviewed: 07/22/2015 Elsevier Interactive Patient Education  2017 [place] Prevention in the Home Falls can cause injuries. They can happen to people of all ages. There are many things you can do to make your home safe and to help prevent falls. What can I do on the outside of my home? Regularly fix the edges of walkways and driveways and fix any cracks. Remove anything that might make you trip as you walk through a door, such as a raised step or threshold. Trim any bushes or trees on the path to your home. Use bright outdoor lighting. Clear any walking paths of anything that might make someone trip, such as rocks or tools. Regularly check to see if handrails are loose or broken. Make sure that both sides of any steps have handrails. Any raised decks and porches should have guardrails on the edges. Have any leaves, snow, or ice cleared regularly. Use sand or salt on walking paths during winter. Clean up any spills in your garage right away. This includes oil or grease spills. What can I do in the bathroom? Use night lights. Install grab bars by the toilet and in the tub and shower. Do not use towel bars as grab bars. Use non-skid mats or decals in the tub or shower. If you need to sit down in the shower, use a plastic, non-slip stool. Keep the floor dry. Clean up any water that spills on the floor as soon as it happens. Remove soap buildup in the tub or shower regularly. Attach  bath mats securely with double-sided non-slip rug tape. Do not have throw rugs and other things on the floor that can make you trip. What can I do in the bedroom? Use night lights. Make sure that you have a light by your bed that is easy to reach. Do not use any sheets or blankets that are too big for your bed. They should not hang down onto the floor. Have a firm chair that has side arms. You can use this for support while you get dressed. Do not have throw rugs and other things on the floor that can make you trip. What can I do in the kitchen? Clean up any spills right away. Avoid walking on wet floors. Keep items that you use a lot in easy-to-reach places. If you need to reach something above you, use a strong step stool that has a grab bar. Keep electrical cords out of the way. Do not use floor polish or wax that makes floors slippery. If you must use wax, use non-skid floor wax. Do not have throw rugs and other things on the floor that can make you trip. What can I do with my stairs? Do not leave any items on the stairs. Make sure that there are handrails on both sides of the stairs and use them. Fix handrails that are broken or loose. Make sure that handrails are as long as the stairways. Check any  carpeting to make sure that it is firmly attached to the stairs. Fix any carpet that is loose or worn. Avoid having throw rugs at the top or bottom of the stairs. If you do have throw rugs, attach them to the floor with carpet tape. Make sure that you have a light switch at the top of the stairs and the bottom of the stairs. If you do not have them, ask someone to add them for you. What else can I do to help prevent falls? Wear shoes that: Do not have high heels. Have rubber bottoms. Are comfortable and fit you well. Are closed at the toe. Do not wear sandals. If you use a stepladder: Make sure that it is fully opened. Do not climb a closed stepladder. Make sure that both sides of the  stepladder are locked into place. Ask someone to hold it for you, if possible. Clearly mark and make sure that you can see: Any grab bars or handrails. First and last steps. Where the edge of each step is. Use tools that help you move around (mobility aids) if they are needed. These include: Canes. Walkers. Scooters. Crutches. Turn on the lights when you go into a dark area. Replace any light bulbs as soon as they burn out. Set up your furniture so you have a clear path. Avoid moving your furniture around. If any of your floors are uneven, fix them. If there are any pets around you, be aware of where they are. Review your medicines with your doctor. Some medicines can make you feel dizzy. This can increase your chance of falling. Ask your doctor what other things that you can do to help prevent falls. This information is not intended to replace advice given to you by your health care provider. Make sure you discuss any questions you have with your health care provider. Document Released: 07/17/2009 Document Revised: 02/26/2016 Document Reviewed: 10/25/2014 Elsevier Interactive Patient Education  2017 [employer].

## 2022-04-13 ENCOUNTER — Telehealth: Payer: Self-pay | Admitting: Family Medicine

## 2022-04-13 ENCOUNTER — Encounter: Payer: Self-pay | Admitting: Family Medicine

## 2022-04-13 ENCOUNTER — Ambulatory Visit (INDEPENDENT_AMBULATORY_CARE_PROVIDER_SITE_OTHER): Payer: Medicare HMO | Admitting: Family Medicine

## 2022-04-13 VITALS — BP 127/70 | HR 73 | Temp 97.1°F | Ht 68.0 in | Wt 163.0 lb

## 2022-04-13 DIAGNOSIS — Z79899 Other long term (current) drug therapy: Secondary | ICD-10-CM | POA: Diagnosis not present

## 2022-04-13 DIAGNOSIS — I2510 Atherosclerotic heart disease of native coronary artery without angina pectoris: Secondary | ICD-10-CM | POA: Diagnosis not present

## 2022-04-13 DIAGNOSIS — I700 Atherosclerosis of aorta: Secondary | ICD-10-CM | POA: Diagnosis not present

## 2022-04-13 DIAGNOSIS — Z0000 Encounter for general adult medical examination without abnormal findings: Secondary | ICD-10-CM | POA: Diagnosis not present

## 2022-04-13 DIAGNOSIS — I10 Essential (primary) hypertension: Secondary | ICD-10-CM | POA: Diagnosis not present

## 2022-04-13 DIAGNOSIS — E785 Hyperlipidemia, unspecified: Secondary | ICD-10-CM

## 2022-04-13 DIAGNOSIS — E559 Vitamin D deficiency, unspecified: Secondary | ICD-10-CM

## 2022-04-13 LAB — COMPREHENSIVE METABOLIC PANEL
ALT: 19 U/L (ref 0–53)
AST: 19 U/L (ref 0–37)
Albumin: 4.3 g/dL (ref 3.5–5.2)
Alkaline Phosphatase: 52 U/L (ref 39–117)
BUN: 15 mg/dL (ref 6–23)
CO2: 29 mEq/L (ref 19–32)
Calcium: 9.8 mg/dL (ref 8.4–10.5)
Chloride: 102 mEq/L (ref 96–112)
Creatinine, Ser: 0.7 mg/dL (ref 0.40–1.50)
GFR: 88.05 mL/min (ref >60.00)
Glucose, Bld: 88 mg/dL (ref 70–99)
Potassium: 4.7 mEq/L (ref 3.5–5.1)
Sodium: 136 mEq/L (ref 135–145)
Total Bilirubin: 0.6 mg/dL (ref 0.2–1.2)
Total Protein: 6.6 g/dL (ref 6.0–8.3)

## 2022-04-13 LAB — TSH: TSH: 0.84 u[IU]/mL (ref 0.35–5.50)

## 2022-04-13 LAB — CBC WITH DIFFERENTIAL/PLATELET
Basophils Absolute: 0 10*3/uL (ref 0.0–0.1)
Basophils Relative: 0.7 % (ref 0.0–3.0)
Eosinophils Absolute: 0.1 10*3/uL (ref 0.0–0.7)
Eosinophils Relative: 1.7 % (ref 0.0–5.0)
HCT: 42.2 % (ref 39.0–52.0)
Hemoglobin: 14.6 g/dL (ref 13.0–17.0)
Lymphocytes Relative: 33 % (ref 12.0–46.0)
Lymphs Abs: 1.8 10*3/uL (ref 0.7–4.0)
MCHC: 34.5 g/dL (ref 30.0–36.0)
MCV: 90.9 fl (ref 78.0–100.0)
Monocytes Absolute: 0.6 10*3/uL (ref 0.1–1.0)
Monocytes Relative: 11.2 % (ref 3.0–12.0)
Neutro Abs: 2.8 10*3/uL (ref 1.4–7.7)
Neutrophils Relative %: 53.4 % (ref 43.0–77.0)
Platelets: 186 10*3/uL (ref 150.0–400.0)
RBC: 4.64 Mil/uL (ref 4.22–5.81)
RDW: 14 % (ref 11.5–15.5)
WBC: 5.3 10*3/uL (ref 4.0–10.5)

## 2022-04-13 LAB — LIPID PANEL
Cholesterol: 102 mg/dL (ref 0–200)
HDL: 42.9 mg/dL (ref >39.00)
LDL Cholesterol: 50 mg/dL (ref 0–99)
NonHDL: 59.05
Total CHOL/HDL Ratio: 2
Triglycerides: 45 mg/dL (ref 0.0–149.0)
VLDL: 9 mg/dL (ref 0.0–40.0)

## 2022-04-13 LAB — VITAMIN B12: Vitamin B-12: 331 pg/mL (ref 211–911)

## 2022-04-13 LAB — VITAMIN D 25 HYDROXY (VIT D DEFICIENCY, FRACTURES): VITD: 52.01 ng/mL (ref 30.00–100.00)

## 2022-04-13 MED ORDER — OMEPRAZOLE 40 MG PO CPDR: 40.0000 mg | DELAYED_RELEASE_CAPSULE | Freq: Every day | ORAL | 3 refills | Status: DC

## 2022-04-13 MED ORDER — TRAZODONE HCL 50 MG PO TABS: ORAL_TABLET | ORAL | 3 refills | Status: DC

## 2022-04-13 MED ORDER — TAMSULOSIN HCL 0.4 MG PO CAPS: 0.4000 mg | ORAL_CAPSULE | Freq: Every day | ORAL | 3 refills | Status: DC

## 2022-04-13 NOTE — Progress Notes (Signed)
Phone: [phone]   Subjective:  Patient presents today for their annual physical. Chief complaint-noted.   See problem oriented charting- ROS- full  review of systems was completed and negative  Per full ROS sheet completed by patient (reports stable nocturia and urgency)   The following were reviewed and entered/updated in epic: Past Medical History:  Diagnosis Date   Anxiety    Coronary artery disease    status post PTCA and stenting of his left circumflex artery and right coronary artery   GERD (gastroesophageal reflux disease)    H/O heart artery stent    Hyperlipidemia    Hypertension    Pt denies   Reflux    omeprazole 40mg    Patient Active Problem List   Diagnosis Date Noted   CAD (coronary artery disease) 06/21/2011    Priority: High   Aortic atherosclerosis (HCC) 12/17/2019    Priority: Medium    Insomnia 04/11/2019    Priority: Medium    Claudication of lower extremity (HCC) 10/24/2018    Priority: Medium    BPH (benign prostatic hyperplasia) 09/06/2016    Priority: Medium    Essential hypertension 08/14/2016    Priority: Medium    Hyperlipidemia 06/21/2011    Priority: Medium    Cholelithiasis with chronic cholecystitis 01/12/2021    Priority: Low   Allergic rhinitis 01/28/2020    Priority: Low   Plantar fasciitis of left foot 06/21/2018    Priority: Low   Anxiety state 09/06/2016    Priority: Low   History of adenomatous polyp of colon 08/14/2016    Priority: Low   Vitamin D deficiency 08/14/2016    Priority: Low   GERD (gastroesophageal reflux disease)     Priority: Low   Thyromegaly 04/08/2021   Past Surgical History:  Procedure Laterality Date   Benign Cyst     on back   CARDIOVASCULAR STRESS TEST  05/09/2007   EF 64%   carpal tunnel left     CATARACT EXTRACTION     bilateral- april 15th with repeat 3 weeks later   CHOLECYSTECTOMY N/A 01/12/2021   Procedure: LAPAROSCOPIC CHOLECYSTECTOMY WITH INTRAOPERATIVE CHOLANGIOGRAM;  Surgeon:  03/14/2021, MD;  Location: [facility];  Service: General;  Laterality: N/A;  90MIN/RM2   CORONARY ANGIOPLASTY WITH STENT PLACEMENT     CYSTECTOMY     removed from finger   cystoscopy     LIPOMA EXCISION     Back   right knee surgery     arthroscopyc 02/2010    Family History  Problem Relation Age of Onset   Bladder Cancer Mother        passed age 91.    Heart attack Father        29 passed from this   Alcohol abuse Father     Medications- reviewed and updated Current Outpatient Medications  Medication Sig Dispense Refill   aspirin 81 MG tablet Take 1 tablet (81 mg total) by mouth daily.     atorvastatin (LIPITOR) 40 MG tablet Take 1 tablet (40 mg total) by mouth daily. Call and schedule one year follow up appointment 90 tablet 0   Calcium Carbonate-Vitamin D (CALCIUM + D PO) Take 1 tablet by mouth daily.     fluticasone (FLONASE) 50 MCG/ACT nasal spray Place 2 sprays into both nostrils daily. (Patient taking differently: Place 2 sprays into both nostrils daily as needed for allergies.) 48 g 1   glucosamine-chondroitin 500-400 MG tablet Take 1 tablet by mouth 2 (two) times daily.  melatonin 5 MG TABS Take 5 mg by mouth at bedtime.     metoprolol tartrate (LOPRESSOR) 25 MG tablet Take 1 tablet (25 mg total) by mouth 2 (two) times daily. Call and schedule one year follow up appointment 180 tablet 0   Multiple Vitamin (MULTIVITAMIN PO) Take 1 tablet by mouth daily.     Omega-3 Fatty Acids (FISH OIL) 1200 MG CAPS Take 1,200 mg by mouth daily.     omeprazole (PRILOSEC) 40 MG capsule Take 1 capsule (40 mg total) by mouth daily. 90 capsule 3   tamsulosin (FLOMAX) 0.4 MG CAPS capsule Take 1 capsule (0.4 mg total) by mouth daily. 90 capsule 3   traZODone (DESYREL) 50 MG tablet TAKE 1/2 TO 1 TABLET AT BEDTIME AS NEEDED FOR SLEEP 90 tablet 3   No current facility-administered medications for this visit.    Allergies-reviewed and updated Allergies  Allergen Reactions   Hydrocodone Nausea  And Vomiting   Simvastatin Other (See Comments)    Leg pain     Social History   Social History Narrative   Widowed march 2023.  3 grown children, 3 grown stepkids. 9 grandkids.    Lives in [place] now      Retired from [employer]. Over 46 years.    Objective  Objective:  BP 127/70   Pulse 73   Temp (!) 97.1 F (36.2 C)   Ht 5\' 8"  (1.727 m)   Wt 163 lb (73.9 kg)   SpO2 98%   BMI 24.78 kg/m  Gen: NAD, resting comfortably HEENT: Mucous membranes are moist. Oropharynx normal Neck: stable thyromegaly CV: RRR no murmurs rubs or gallops Lungs: CTAB no crackles, wheeze, rhonchi Abdomen: soft/nontender/nondistended/normal bowel sounds. No rebound or guarding.  Ext: no edema Skin: warm, dry Neuro: grossly normal, moves all extremities, PERRLA    Assessment and Plan  81 y.o. male presenting for annual physical.  Health Maintenance counseling: 1. Anticipatory guidance: Patient counseled regarding regular dental exams -q6 months, eye exams -yearly,  avoiding smoking and second hand smoke , limiting alcohol to 2 beverages per day - does not drink, no illicit drugs .   2. Risk factor reduction:  Advised patient of need for regular exercise and diet rich and fruits and vegetables to reduce risk of heart attack and stroke.  Exercise- bowling 3 days a week plus working in yard- had been doing 3 days a week outside of these activities- encouraged to restart.  Diet/weight management-diet has changed with wife passing- trying to eat reasonably healthy.  -encouraged to try to limit artificial sweeteners- uses splenda and dirnks diet coke Wt Readings from Last 3 Encounters:  04/13/22 163 lb (73.9 kg)  10/13/21 163 lb 12.8 oz (74.3 kg)  06/09/21 154 lb 9.6 oz (70.1 kg)  3. Immunizations/screenings/ancillary studies-recommended for COVID and flu shot . Shingrix at pharmacy considering Immunization History  Administered Date(s) Administered   Fluad Quad(high Dose 65+) 06/13/2019,  06/24/2020, 08/18/2021   Influenza, High Dose Seasonal PF 08/28/2017   Influenza-Unspecified 07/28/2016   PFIZER(Purple Top)SARS-COV-2 Vaccination 10/15/2019, 11/04/2019, 07/11/2020   Pfizer Covid-19 Vaccine Bivalent Booster 54yrs & up 09/15/2021   Pneumococcal Conjugate-13 08/16/2014   Pneumococcal Polysaccharide-23 07/08/2010   Tdap 07/08/2010  4. Prostate cancer screening- past age based screening- we did check last year due to some worsening urinary symptoms but stable this year  Lab Results  Component Value Date   PSA 0.52 04/08/2021   PSA 0.74 10/20/2017   5. Colon cancer screening -  history  adenomatous polyps 09/16/2016 with Dr. Elnoria Howard- he will call for follow up (we had entered referral to [facility] but he should stay with same physician)- he will have them send [place] a copy when done 6. Skin cancer screening- no dermatologist. advised regular sunscreen use. Denies worrisome, changing, or new skin lesions.  7. Smoking associated screening (lung cancer screening, AAA screen 65-75, UA)- NEVER smoker 8. STD screening - not sexually active after passing of wife  Status of chronic or acute concerns   #CAD-follows with Dr. Elease Hashimoto with history of stent  #hyperlipidemia #Claudication lower extremity #Aortic atherosclerosis S: Medication: atorvastatin 40 mg ,asa 81 mg, metoprolol 25 mg twice daily -Claudication in thighs with climbing stairs-not doing as many stairs but he states when he does stairs- he goes quickly and actually helps  - no chest pain or shortness of breath- last saw Dr. Elease Hashimoto 9.6.23  A/P:  CAD-asymptomatic and up-to-date on cardiology follow-up-continue current medication hyperlipidemia-has been well controlled with LDL under 70-update with labs today-continue current medication for now Claudication lower extremity-actually improved-continue risk factor modification Aortic atherosclerosis-presumed stable-continue risk factor modification  #hypertension S: medication:  Metoprolol 25 mg twice daily Home readings #s: does not check A/P: Controlled. Continue current medications.   # GERD-with history of esophageal dilation with Dr. Kinnie Scales in 2011 (no longer sees) S:Medication: Omeprazole 40 mg- he states Dr. Elnoria Howard prefers higher dose B12 levels related to PPI use: Ordered 2023 A/P: Controlled. Continue current medications since sounds like GI prefers full dose- perhaps with dilation history    #BPH S: Medication: Tamsulosin 0.4 mg through [place] A/P: overall stable- continue current meds   #Vitamin D deficiency S: Medication: Multivitamin plus calcium plus vitamin D A/P: hopefully stable- update vitamin D today. Continue current meds for now   #Insomnia-takes melatonin 5 mg and/or trazodone 50 mg (half nightly)- doing reasonably well   #Thyromegaly-noted on imaging 10/31/2018-no nodules.  Will intermittently check TSH  - offered repeat ultrasound but he states stable- wants to monitor  #History of anxiety-on Paxil in the past   Recommended follow up: Return in about 6 months (around 10/14/2022) for followup or sooner if needed.Schedule b4 you leave. Future Appointments  Date Time Provider Department Center  03/11/2023  8:00 AM [facility]   Lab/Order associations: NOT fasting- coffee with sugar free creamer and OJ   ICD-10-CM   1. Preventative health care  Z00.00     2. Coronary artery disease involving native coronary artery of native heart without angina pectoris  I25.10     3. Aortic atherosclerosis (HCC)  I70.0     4. Essential hypertension  I10     5. Hyperlipidemia, unspecified hyperlipidemia type  E78.5 CBC with Differential/Platelet    Comprehensive metabolic panel    Lipid panel    TSH    6. Vitamin D deficiency  E55.9 VITAMIN D 25 Hydroxy (Vit-D Deficiency, Fractures)    7. High risk medication use  Z79.899 B12     Meds ordered this encounter  Medications   omeprazole (PRILOSEC) 40 MG capsule    Sig: Take 1  capsule (40 mg total) by mouth daily.    Dispense:  90 capsule    Refill:  3   traZODone (DESYREL) 50 MG tablet    Sig: TAKE 1/2 TO 1 TABLET AT BEDTIME AS NEEDED FOR SLEEP    Dispense:  90 tablet    Refill:  3   tamsulosin (FLOMAX) 0.4 MG CAPS capsule    Sig:  Take 1 capsule (0.4 mg total) by mouth daily.    Dispense:  90 capsule    Refill:  3   Return precautions advised.  [name], MD

## 2022-04-13 NOTE — Patient Instructions (Addendum)
Health Maintenance Due  Topic Date Due   COLONOSCOPY (Pts 45-50yrs Insurance coverage will need to be confirmed)  09/16/2021  Call Dr. Elnoria Howard and have them send [place] a copy after colonoscopy- we can enter new referral if needed  Please stop by lab before you go If you have mychart- we will send your results within 3 business days of [place] receiving them.  If you do not have mychart- we will call you about results within 5 business days of [place] receiving them.  *please also note that you will see labs on mychart as soon as they post. I will later go in and write notes on them- will say "notes from Dr. Durene Cal"   Recommended follow up: Return in about 6 months (around 10/14/2022) for followup or sooner if needed.Schedule b4 you leave.

## 2022-04-13 NOTE — Telephone Encounter (Signed)
Team please get a copy of colonoscopy from Dr. Elnoria Howard as well as pathology and notes on follow up   Yes reasonable to get Tdap at this time. Hold off on pneumonia

## 2022-04-13 NOTE — Telephone Encounter (Signed)
Patient states he received his Shingles vaccine today (04/13/22). Patient states regarding Colonoscopy-Dr. Audley Hose told Patient he is not due for a Colonoscopy until May of 2026.  Patient requests to be called at ph# [phone] to be advised on whether Patient  should get his Tetanus and Pneumonia vaccines.

## 2022-04-13 NOTE — Telephone Encounter (Signed)
FYI. Shingrix updated.

## 2022-04-16 NOTE — Telephone Encounter (Signed)
Patient states he received his Tetanus shot today (04/16/22) AT [facility]

## 2022-04-16 NOTE — Telephone Encounter (Signed)
Called and spoke with pt and below message given, pt working on getting records.

## 2022-04-19 NOTE — Telephone Encounter (Signed)
Tdap documented.

## 2022-05-13 DIAGNOSIS — H18413 Arcus senilis, bilateral: Secondary | ICD-10-CM | POA: Diagnosis not present

## 2022-05-13 DIAGNOSIS — H40013 Open angle with borderline findings, low risk, bilateral: Secondary | ICD-10-CM | POA: Diagnosis not present

## 2022-05-13 DIAGNOSIS — H18593 Other hereditary corneal dystrophies, bilateral: Secondary | ICD-10-CM | POA: Diagnosis not present

## 2022-05-13 DIAGNOSIS — Z961 Presence of intraocular lens: Secondary | ICD-10-CM | POA: Diagnosis not present

## 2022-06-09 ENCOUNTER — Ambulatory Visit: Payer: Medicare HMO | Attending: Cardiovascular Disease | Admitting: Cardiovascular Disease

## 2022-06-09 ENCOUNTER — Encounter: Payer: Self-pay | Admitting: Cardiovascular Disease

## 2022-06-09 VITALS — BP 102/70 | HR 86 | Ht 68.0 in | Wt 161.0 lb

## 2022-06-09 DIAGNOSIS — I2510 Atherosclerotic heart disease of native coronary artery without angina pectoris: Secondary | ICD-10-CM | POA: Diagnosis not present

## 2022-06-09 MED ORDER — METOPROLOL TARTRATE 25 MG PO TABS: 25.0000 mg | ORAL_TABLET | Freq: Two times a day (BID) | ORAL | 3 refills | Status: DC

## 2022-06-09 MED ORDER — SILDENAFIL CITRATE 100 MG PO TABS: 100.0000 mg | ORAL_TABLET | Freq: Every day | ORAL | 3 refills | Status: DC | PRN

## 2022-06-09 MED ORDER — ATORVASTATIN CALCIUM 40 MG PO TABS: 40.0000 mg | ORAL_TABLET | Freq: Every day | ORAL | 3 refills | Status: DC

## 2022-06-09 NOTE — Patient Instructions (Signed)
Medication Instructions:  TAKE Sildenafil as needed *If you need a refill on your cardiac medications before your next appointment, please call your pharmacy*   Lab Work: NONE If you have labs (blood work) drawn today and your tests are completely normal, you will receive your results only by: MyChart Message (if you have MyChart) OR A paper copy in the mail If you have any lab test that is abnormal or we need to change your treatment, we will call you to review the results.   Testing/Procedures: NONE   Follow-Up: At [facility], you and your health needs are our priority.  As part of our continuing mission to provide you with exceptional heart care, we have created designated Provider Care Teams.  These Care Teams include your primary Cardiologist (physician) and Advanced Practice Providers (APPs -  Physician Assistants and Nurse Practitioners) who all work together to provide you with the care you need, when you need it.  We recommend signing up for the patient portal called "MyChart".  Sign up information is provided on this After Visit Summary.  MyChart is used to connect with patients for Virtual Visits (Telemedicine).  Patients are able to view lab/test results, encounter notes, upcoming appointments, etc.  Non-urgent messages can be sent to your provider as well.   To learn more about what you can do with MyChart, go to [email or web address].    Your next appointment:   1 year(s)  The format for your next appointment:   In Person  Provider:   [name], MD       Important Information About Sugar

## 2022-06-09 NOTE — Progress Notes (Signed)
Cardiology Office Note   Date:  06/09/2022   ID:  [name] [name], [name] [name] [birth year], MRN [id number]  PCP:  Shelva Majestic, MD  Cardiologist:   [name], MD   Chief Complaint  Patient presents with   Coronary Artery Disease        1. Coronary artery disease-status post PTCA and stenting of his left circumflex artery and right coronary artery 2. Hyperlipidemia    Previous notes:  81 year old gentleman with a history of coronary artery disease. He status post PTCA and stenting of the left circumflex artery as well as PTCA of  the posterior descending artery and January 2002. He's done very well. He has not had any episodes of chest pain or shortness of breath.  January 16, 2014:  [name] [name] feels well - no angina, no dyspnea.    He is watching his diet   January 21, 2015:  [name] [name] is a 81 y.o. male who presents for follow-up of his coronary artery disease. Still bowling  [facility]  Does some cardio.   January 21, 2016:  [name] [name] is seen today for his CAD.    Has some muscle stiffness.  No muscle aches.  Has done some stretches.   January 27, 2017:  Having some muscle pull issus Having some ortho issues.  No CP  Is now changing Dr. [name] at [place].  Has stopped his Niacin .   Still goes to the [facility].  Does light cardio and some machines.   March 17, 2018:  No CP , some dyspnea.   Especially with walking . Has some thigh pain with exercise.   Does not walk regularly  [facility].   Rides a bike, walks on treadmill , works out on [employer] , seated ellipitical   Aug. 9, 2021: [name] [name] is seen for follow up of his CAD, HLD  Still very active. Bowls regularly  No CP , no dyspnea   Labs from Jan were reviewed  Wt today is 168.  Has lost weight on purpose - 25 lbs over the past 2 years.   Sept. 6, 2022: [name] [name] is seen today for follow up of his CAD, HLD, HTN. Has had cataract surgery ,  gall bladder  surgery   No angina ,    Depressed by the passing of his wife in March   No CP, getting exercise , bowls 3 days a week   Lipid levels drawn by Dr. Durene Cal in July, 2023 reveal a total cholesterol of 102 HDL is 42.9 LDL is 50 Triglyceride level is 45.  Hemoglobin is 14.6, potassium is 4.7.  Glucose is 88.  Past Medical History:  Diagnosis Date   Anxiety    Coronary artery disease    status post PTCA and stenting of his left circumflex artery and right coronary artery   GERD (gastroesophageal reflux disease)    H/O heart artery stent    Hyperlipidemia    Hypertension    Pt denies   Reflux    omeprazole 40mg     Past Surgical History:  Procedure Laterality Date   Benign Cyst     on back   CARDIOVASCULAR STRESS TEST  05/09/2007   EF 64%   carpal tunnel left     CATARACT EXTRACTION     bilateral- april 15th with repeat 3 weeks later   CHOLECYSTECTOMY N/A 01/12/2021   Procedure: LAPAROSCOPIC CHOLECYSTECTOMY WITH  INTRAOPERATIVE CHOLANGIOGRAM;  Surgeon: Armandina Gemma, MD;  Location: [facility];  Service: General;  Laterality: N/A;  90MIN/RM2   CORONARY ANGIOPLASTY WITH STENT PLACEMENT     CYSTECTOMY     removed from finger   cystoscopy     LIPOMA EXCISION     Back   right knee surgery     arthroscopyc 02/2010     Current Outpatient Medications  Medication Sig Dispense Refill   aspirin 81 MG tablet Take 1 tablet (81 mg total) by mouth daily.     atorvastatin (LIPITOR) 40 MG tablet Take 1 tablet (40 mg total) by mouth daily. Call and schedule one year follow up appointment 90 tablet 0   Calcium Carbonate-Vitamin D (CALCIUM + D PO) Take 1 tablet by mouth daily.     fluticasone (FLONASE) 50 MCG/ACT nasal spray Place 2 sprays into both nostrils daily. (Patient taking differently: Place 2 sprays into both nostrils daily as needed for allergies.) 48 g 1   glucosamine-chondroitin 500-400 MG tablet Take 1 tablet by mouth 2 (two) times daily.     melatonin 5 MG TABS Take 5 mg by mouth  at bedtime.     metoprolol tartrate (LOPRESSOR) 25 MG tablet Take 1 tablet (25 mg total) by mouth 2 (two) times daily. Call and schedule one year follow up appointment 180 tablet 0   Multiple Vitamin (MULTIVITAMIN PO) Take 1 tablet by mouth daily.     Omega-3 Fatty Acids (FISH OIL) 1200 MG CAPS Take 1,200 mg by mouth daily.     omeprazole (PRILOSEC) 40 MG capsule Take 1 capsule (40 mg total) by mouth daily. 90 capsule 3   tamsulosin (FLOMAX) 0.4 MG CAPS capsule Take 1 capsule (0.4 mg total) by mouth daily. 90 capsule 3   traZODone (DESYREL) 50 MG tablet TAKE 1/2 TO 1 TABLET AT BEDTIME AS NEEDED FOR SLEEP 90 tablet 3   No current facility-administered medications for this visit.    Allergies:   Hydrocodone and Simvastatin    Social History:  The patient  reports that he has never smoked. He has never used smokeless tobacco. He reports that he does not drink alcohol and does not use drugs.   Family History:  The patient's family history includes Alcohol abuse in his father; Bladder Cancer in his mother; Heart attack in his father.    ROS: Noted in current history, otherwise review of systems is negative.   Physical Exam: Blood pressure 102/70, pulse 86, height 5\' 8"  (1.727 m), weight 161 lb (73 kg).    GEN:  Well nourished, well developed in no acute distress HEENT: Normal NECK: No JVD; No carotid bruits LYMPHATICS: No lymphadenopathy CARDIAC: RRR , no murmurs, rubs, gallops RESPIRATORY:  Clear to auscultation without rales, wheezing or rhonchi  ABDOMEN: Soft, non-tender, non-distended MUSCULOSKELETAL:  No edema; No deformity  SKIN: Warm and dry NEUROLOGIC:  Alert and oriented x 3   EKG:      June 09, 2022: Normal sinus rhythm.  Possible inferior wall myocardial infarction.  Recent Labs: 04/13/2022: ALT 19; BUN 15; Creatinine, Ser 0.70; Hemoglobin 14.6; Platelets 186.0; Potassium 4.7; Sodium 136; TSH 0.84    Lipid Panel    Component Value Date/Time   CHOL 102  04/13/2022 0859   CHOL 110 06/09/2021 0936   TRIG 45.0 04/13/2022 0859   HDL 42.90 04/13/2022 0859   HDL 47 06/09/2021 0936   CHOLHDL 2 04/13/2022 0859   VLDL 9.0 04/13/2022 0859   LDLCALC 50 04/13/2022 0859  LDLCALC 51 06/09/2021 0936   LDLDIRECT 58.0 04/08/2021 1140      Wt Readings from Last 3 Encounters:  06/09/22 161 lb (73 kg)  04/13/22 163 lb (73.9 kg)  10/13/21 163 lb 12.8 oz (74.3 kg)      Other studies Reviewed: Additional studies/ records that were reviewed today include: . Review of the above records demonstrates:    ASSESSMENT AND PLAN:  1. Coronary artery disease-   no angina , cont meds    2. Hyperlipidemia -    labs were stable   3. ED :  would like a prescription for Sildenafil 100 mg ,  print prescription    Current medicines are reviewed at length with the patient today.  The patient does not have concerns regarding medicines.  The following changes have been made:  no change  Labs/ tests ordered today include:   No orders of the defined types were placed in this encounter.     Disposition:         Signed, [name], MD  06/09/2022 11:20 AM    [facility] [address] [place], [place], [place]  [address] Phone: [phone]; Fax: [phone]

## 2022-06-18 ENCOUNTER — Encounter: Payer: Self-pay | Admitting: Family Medicine

## 2022-10-01 IMAGING — MR MR ABDOMEN WO/W CM MRCP
19 of 21 series · 45 of 48 positions shown · IV contrast (Gadavist)
Comparison: 12/01/2020 abdominal ultrasound. Abdominopelvic CT of
12/01/2007 from [HOSPITAL] is also reviewed.

CLINICAL DATA: Right upper quadrant abdominal pain. Nondiagnostic
ultrasound. Gallbladder mass suspected.

EXAM:
MRI ABDOMEN WITHOUT AND WITH CONTRAST (INCLUDING MRCP)
TECHNIQUE: Multiplanar multisequence MR imaging of the abdomen was performed
both before and after the administration of intravenous contrast.
Heavily T2-weighted images of the biliary and pancreatic ducts were
obtained, and three-dimensional MRCP images were rendered by post
processing.
CONTRAST:  7.5mL GADAVIST GADOBUTROL 1 MMOL/ML IV SOLN

[Series 4: ax haste · axial · 6.0mm · 1.25mm/px · 1 of 30 slices shown]
[im 1/30]
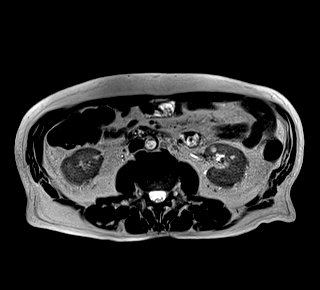

[Series 6: cor haste · coronal · 6.0mm · 1.31mm/px · 1 of 30 slices shown]
[im 1/30]
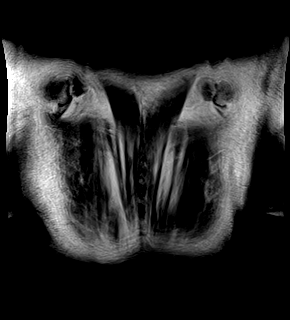

[Series 9: T2 fat-sat · axial · 6.0mm · 1.25mm/px · 1 of 30 slices shown]
[im 1/30]
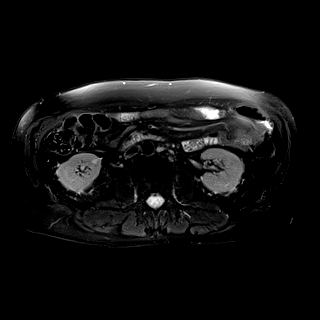

[Series 10: DWI · axial · 6.0mm · 1.53mm/px · z∈[-133,+76]mm · 3 of 90 slices shown (1 of 2)]
[im 1/90]
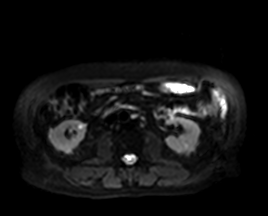
[im 45/90]
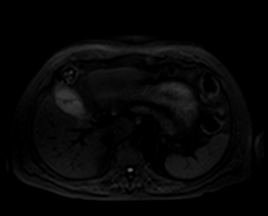
[im 90/90]
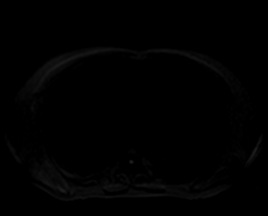

[Series 11: DWI · axial · 6.0mm · 1.53mm/px · 1 of 30 slices shown (2 of 2)]
[im 1/30]
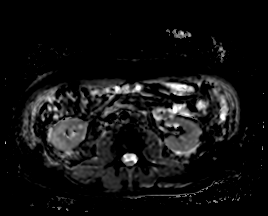

[Series 17: MRCP · coronal · 4.0mm · 1.12mm/px · 1 of 17 slices shown]
[im 1/17]
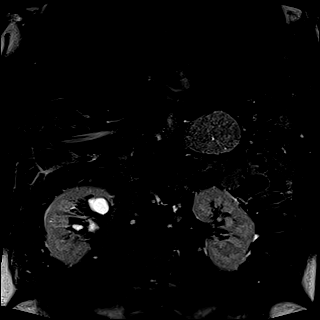

[Series 18: radials · coronal · 50.0mm · 0.78mm/px · 1 of 5 slices shown]
[im 1/5]
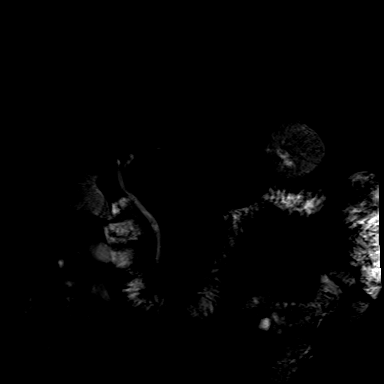

[Series 19: ax in and · axial · 3.0mm · 1.56mm/px · z∈[-148,+65]mm · 3 of 72 slices shown (1 of 2)]
[im 1/72]
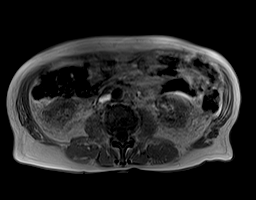
[im 36/72]
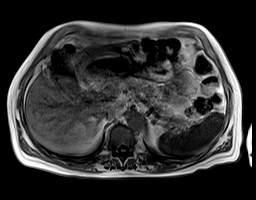
[im 72/72]
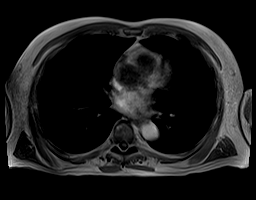

[Series 19: ax in and · axial · 3.0mm · 1.56mm/px · z∈[-148,+65]mm · 3 of 72 slices shown (2 of 2)]
[im 1/72]
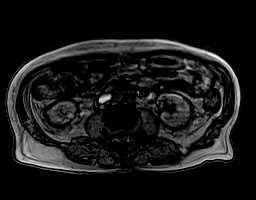
[im 36/72]
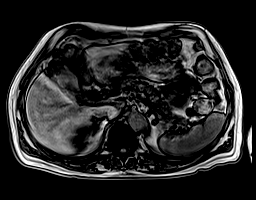
[im 72/72]
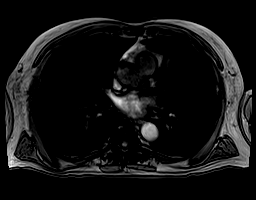

[Series 20: T1 dynamic · axial · non-contrast · 3.0mm · 1.48mm/px · z∈[-125,+64]mm · 3 of 64 slices shown (1 of 5)]
[im 1/64]
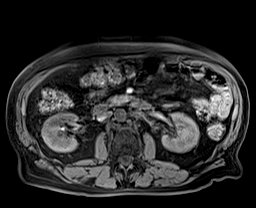
[im 32/64]
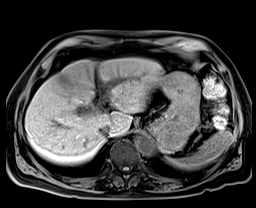
[im 64/64]
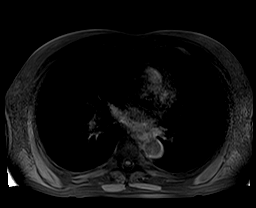

[Series 22: T1 dynamic post-contrast · axial · 3.0mm · 1.48mm/px · z∈[-125,+64]mm · 3 of 64 slices shown (1 of 5)]
[im 1/64]
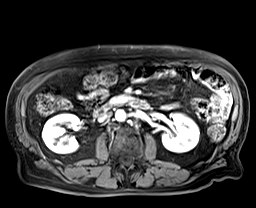
[im 32/64]
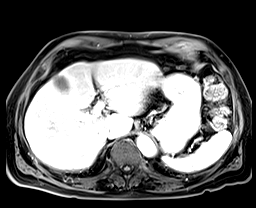
[im 64/64]
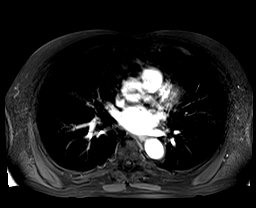

[Series 23: T1 dynamic · axial · 3.0mm · 1.48mm/px · z∈[-125,+64]mm · 3 of 64 slices shown (2 of 5)]
[im 1/64]
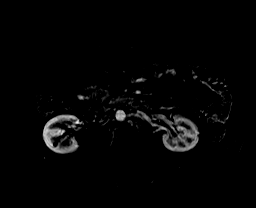
[im 32/64]
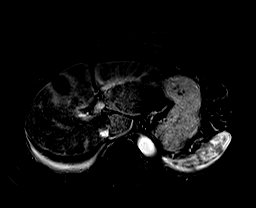
[im 64/64]
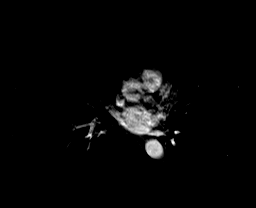

[Series 24: T1 dynamic post-contrast · axial · 3.0mm · 1.48mm/px · z∈[-125,+64]mm · 3 of 64 slices shown (2 of 5)]
[im 1/64]
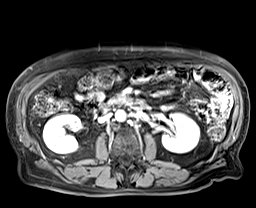
[im 32/64]
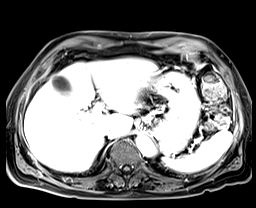
[im 64/64]
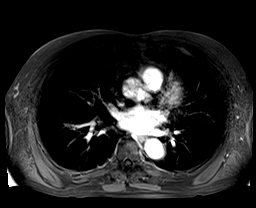

[Series 25: T1 dynamic · axial · 3.0mm · 1.48mm/px · z∈[-125,+64]mm · 3 of 64 slices shown (3 of 5)]
[im 1/64]
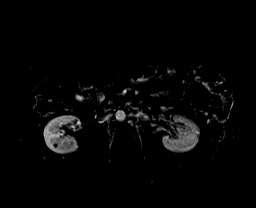
[im 32/64]
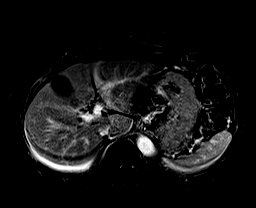
[im 64/64]
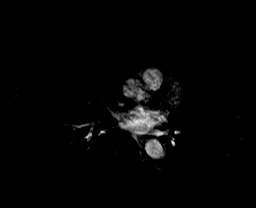

[Series 26: T1 dynamic post-contrast · axial · 3.0mm · 1.48mm/px · z∈[-125,+64]mm · 3 of 64 slices shown (3 of 5)]
[im 1/64]
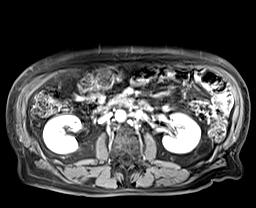
[im 32/64]
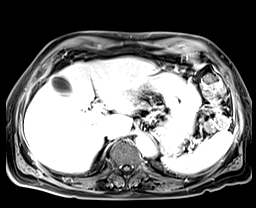
[im 64/64]
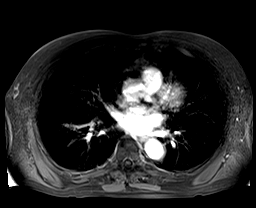

[Series 27: T1 dynamic · axial · 3.0mm · 1.48mm/px · z∈[-125,+64]mm · 3 of 64 slices shown (4 of 5)]
[im 1/64]
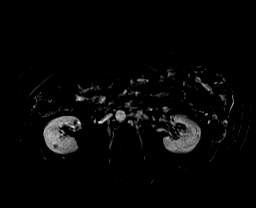
[im 32/64]
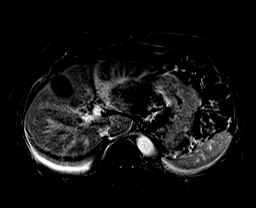
[im 64/64]
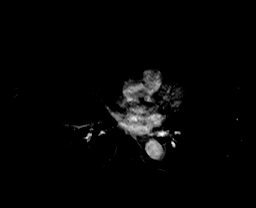

[Series 28: T1 dynamic post-contrast · axial · 3.0mm · 1.48mm/px · z∈[-125,+64]mm · 3 of 64 slices shown (4 of 5)]
[im 1/64]
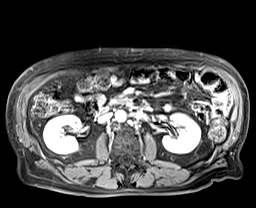
[im 32/64]
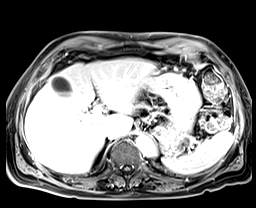
[im 64/64]
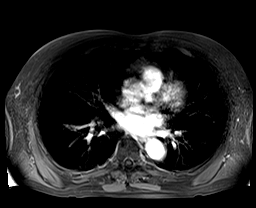

[Series 29: T1 dynamic · axial · 3.0mm · 1.48mm/px · z∈[-125,+64]mm · 3 of 64 slices shown (5 of 5)]
[im 1/64]
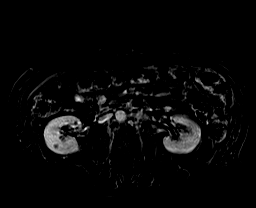
[im 32/64]
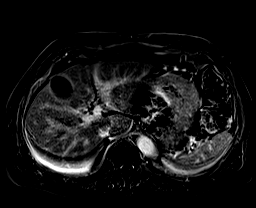
[im 64/64]
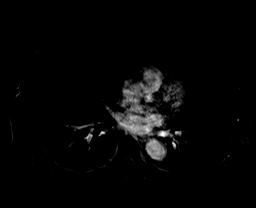

[Series 31: T1 dynamic post-contrast · coronal · 3.0mm · 1.64mm/px · 3 of 72 slices shown (5 of 5)]
[im 1/72]
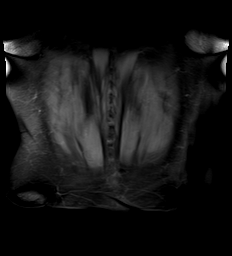
[im 36/72]
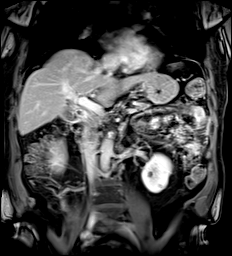
[im 72/72]
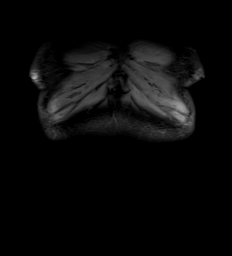

[45 of 48 positions shown; findings below may reference images not displayed]

FINDINGS: Portions of exam are mildly motion degraded.

Lower chest: Normal heart size without pericardial or pleural
effusion.

Hepatobiliary: Foci of arterial phase hepatic hyperenhancement
including within the pericholecystic region on 27/22 and subcapsular
right hepatic lobe on 46/22 are likely perfusion anomalies. No
suspicious liver lesion.

Gallstones and sludge, including on [DATE]. Stones are on the order of
4-5 mm. No evidence gallbladder mass. No acute cholecystitis. No
biliary duct dilatation or choledocholithiasis.

Pancreas:  Normal, without mass or ductal dilatation.

Spleen:  Normal in size, without focal abnormality.

Adrenals/Urinary Tract: Normal adrenal glands. Bilateral small
simple renal cyst. There is also an upper pole right renal 8 mm
lesion which demonstrates precontrast T1 hyperintensity on 47/20,
but no post-contrast enhancement, including on subtracted image
48/23. This consistent with a hemorrhagic/proteinaceous cyst. No
hydronephrosis.

Stomach/Bowel: The proximal stomach is underdistended, but appears
moderate to markedly thick walled. Example at up to 2.4 cm on [DATE]
and 32/22. No well-defined mass. Normal small bowel. Colonic stool
burden suggests constipation.

Vascular/Lymphatic: Aortic atherosclerosis. No retroperitoneal or
retrocrural adenopathy.

Other:  No ascites.

Musculoskeletal: No acute osseous abnormality.
IMPRESSION: 1. Mildly motion degraded exam.
2. Cholelithiasis without acute cholecystitis. No evidence of
gallbladder mass.
3. No biliary duct dilatation or choledocholithiasis.
4. Apparent gastric wall thickening, at least partially felt to be
due to underdistention. Recommend clinical correlation for gastritis
and consideration of endoscopy.
5. Simple renal cysts with an 8 mm upper pole right renal
hemorrhagic/proteinaceous cyst.
6.  Aortic Atherosclerosis ([id number]).

## 2022-10-12 ENCOUNTER — Encounter: Payer: Self-pay | Admitting: Family Medicine

## 2022-10-12 ENCOUNTER — Ambulatory Visit (INDEPENDENT_AMBULATORY_CARE_PROVIDER_SITE_OTHER): Payer: Medicare HMO | Admitting: Family Medicine

## 2022-10-12 VITALS — BP 108/64 | HR 82 | Temp 97.6°F | Ht 68.0 in | Wt 164.6 lb

## 2022-10-12 DIAGNOSIS — E785 Hyperlipidemia, unspecified: Secondary | ICD-10-CM | POA: Diagnosis not present

## 2022-10-12 DIAGNOSIS — I739 Peripheral vascular disease, unspecified: Secondary | ICD-10-CM | POA: Diagnosis not present

## 2022-10-12 DIAGNOSIS — I2510 Atherosclerotic heart disease of native coronary artery without angina pectoris: Secondary | ICD-10-CM

## 2022-10-12 DIAGNOSIS — I10 Essential (primary) hypertension: Secondary | ICD-10-CM | POA: Diagnosis not present

## 2022-10-12 DIAGNOSIS — I700 Atherosclerosis of aorta: Secondary | ICD-10-CM | POA: Diagnosis not present

## 2022-10-12 NOTE — Progress Notes (Signed)
Phone [phone] In person visit   Subjective:   [name] [name] is a 82 y.o. year old very pleasant male patient who presents for/with See problem oriented charting Chief Complaint  Patient presents with   Follow-up   Hypertension   Hyperlipidemia   Past Medical History-  Patient Active Problem List   Diagnosis Date Noted   CAD (coronary artery disease) 06/21/2011    Priority: High   Aortic atherosclerosis (HCC) 12/17/2019    Priority: Medium    Insomnia 04/11/2019    Priority: Medium    Claudication of lower extremity (HCC) 10/24/2018    Priority: Medium    BPH (benign prostatic hyperplasia) 09/06/2016    Priority: Medium    Essential hypertension 08/14/2016    Priority: Medium    Hyperlipidemia 06/21/2011    Priority: Medium    Cholelithiasis with chronic cholecystitis 01/12/2021    Priority: Low   Allergic rhinitis 01/28/2020    Priority: Low   Plantar fasciitis of left foot 06/21/2018    Priority: Low   Anxiety state 09/06/2016    Priority: Low   History of adenomatous polyp of colon 08/14/2016    Priority: Low   Vitamin D deficiency 08/14/2016    Priority: Low   GERD (gastroesophageal reflux disease)     Priority: Low   Thyromegaly 04/08/2021    Medications- reviewed and updated Current Outpatient Medications  Medication Sig Dispense Refill   aspirin 81 MG tablet Take 1 tablet (81 mg total) by mouth daily.     atorvastatin (LIPITOR) 40 MG tablet Take 1 tablet (40 mg total) by mouth daily. 90 tablet 3   Calcium Carbonate-Vitamin D (CALCIUM + D PO) Take 1 tablet by mouth daily.     fluticasone (FLONASE) 50 MCG/ACT nasal spray Place 2 sprays into both nostrils daily. (Patient taking differently: Place 2 sprays into both nostrils daily as needed for allergies.) 48 g 1   glucosamine-chondroitin 500-400 MG tablet Take 1 tablet by mouth 2 (two) times daily.     melatonin 5 MG TABS Take 5 mg by mouth at bedtime.     metoprolol tartrate (LOPRESSOR) 25 MG  tablet Take 1 tablet (25 mg total) by mouth 2 (two) times daily. 180 tablet 3   Multiple Vitamin (MULTIVITAMIN PO) Take 1 tablet by mouth daily.     Omega-3 Fatty Acids (FISH OIL) 1200 MG CAPS Take 1,200 mg by mouth daily.     omeprazole (PRILOSEC) 40 MG capsule Take 1 capsule (40 mg total) by mouth daily. 90 capsule 3   sildenafil (VIAGRA) 100 MG tablet Take 1 tablet (100 mg total) by mouth daily as needed for erectile dysfunction. 30 tablet 3   tamsulosin (FLOMAX) 0.4 MG CAPS capsule Take 1 capsule (0.4 mg total) by mouth daily. 90 capsule 3   traZODone (DESYREL) 50 MG tablet TAKE 1/2 TO 1 TABLET AT BEDTIME AS NEEDED FOR SLEEP 90 tablet 3   No current facility-administered medications for this visit.     Objective:  BP 108/64   Pulse 82   Temp 97.6 F (36.4 C)   Ht 5\' 8"  (1.727 m)   Wt 164 lb 9.6 oz (74.7 kg)   SpO2 98%   BMI 25.03 kg/m  Gen: NAD, resting comfortably CV: RRR no murmurs rubs or gallops Lungs: CTAB no crackles, wheeze, rhonchi Ext: no edema Skin: warm, dry     Assessment and Plan   #social update- lost wife grace -  12/28/21-  holidays were manageable overall  though thankfully.    #CAD-follows with Dr. Acie Fredrickson with history of stent  #hyperlipidemia #Claudication lower extremity #Aortic atherosclerosis S: Medication: atorvastatin 40 mg ,asa 81 mg, metoprolol 25 mg twice daily -Claudication in thighs with climbing stairs-slightly better but not many steps climbed No chest pain or shortness of breath- [employer] has dropped off- encouraged to restart Lab Results  Component Value Date   CHOL 102 04/13/2022   HDL 42.90 04/13/2022   LDLCALC 50 04/13/2022   LDLDIRECT 58.0 04/08/2021   TRIG 45.0 04/13/2022   CHOLHDL 2 04/13/2022    A/P:  CAD-asymptomatic-continue current medications hyperlipidemia-LDL looked excellent last check under 70 at goal for CAD and aortic atherosclerosis Claudication lower extremity-stable or improved. Continue current medications. Aortic  atherosclerosis-LDL under 70-continue current medication  #hypertension S: medication: Metoprolol 25 mg twice daily No lightheadedness BP Readings from Last 3 Encounters:  10/12/22 108/64  06/09/22 102/70  04/13/22 127/70   A/P: Controlled. Continue current medications.  # GERD-with history of esophageal dilation with Dr. Earlean Shawl in 2011 (no longer sees) S:Medication: Omeprazole 40 mg B12 levels related to PPI use: Ordered 2023 with low normal-recommended at least weekly B12- taking b complex every other day  A/P: GERD- stable- continue current medicines . Glad taking b12 supplement- recheck next visit    #BPH S: Medication: Tamsulosin 0.4 mg. Nocturia 3x a night A/P: imperfect control but continue current medications .  - does ok with combo with sildenafil- doesn't take much   #Vitamin D deficiency S: Medication: Multivitamin plus calcium plus vitamin D Last vitamin D Lab Results  Component Value Date   VD25OH 52.01 04/13/2022  A/P: check D next visit- has been controlled    #Insomnia-takes melatonin 5 mg and/or trazodone 50 mg (only takes half)- having some sleep issues- recommended trying full tablet of trazodone to see if helpful   #Colonoscopy- he reports done in 2020 when he called to confirm with Dr. Benson Norway- roi to get records per avs  #exercise goal to get restarted- still bowling but- " [employer] you committed to one day a week through end of february"  Recommended follow up: Return in about 6 months (around 04/12/2023) for physical or sooner if needed.Schedule b4 you leave. Future Appointments  Date Time Provider [employer]  03/11/2023  8:00 AM [facility]   Lab/Order associations:   ICD-10-CM   1. Coronary artery disease involving native coronary artery of native heart without angina pectoris  I25.10     2. Essential hypertension  I10     3. Hyperlipidemia, unspecified hyperlipidemia type  E78.5     4. Aortic atherosclerosis (HCC)  I70.0      5. Claudication of lower extremity ([place])  I73.9      Return precautions advised.  Garret Reddish, MD

## 2022-10-12 NOTE — Patient Instructions (Addendum)
Sign release of information at the check out desk for last colonoscopy from Dr. Benson Norway  Try full tablet of trazodone to see if helpful  [employer] you committed to one day a week through end of February  No labs today since things looked so good  Recommended follow up: Return in about 6 months (around 04/12/2023) for physical or sooner if needed.Schedule b4 you leave.

## 2022-11-11 IMAGING — RF DG CHOLANGIOGRAM OPERATIVE
1 series · 5 of 5 positions shown · non-contrast
Comparison: MRCP 12/02/2020

CLINICAL DATA: 79-year-old male undergoing laparoscopic
cholecystectomy with intraoperative cholangiogram

EXAM:
INTRAOPERATIVE CHOLANGIOGRAM
TECHNIQUE: Cholangiographic images from the C-arm fluoroscopic device were
submitted for interpretation post-operatively. Please see the
procedural report for the amount of contrast and the fluoroscopy
time utilized.

[Series 1: run · 2 acquisitions, 5 frames shown]
[im 1/2]
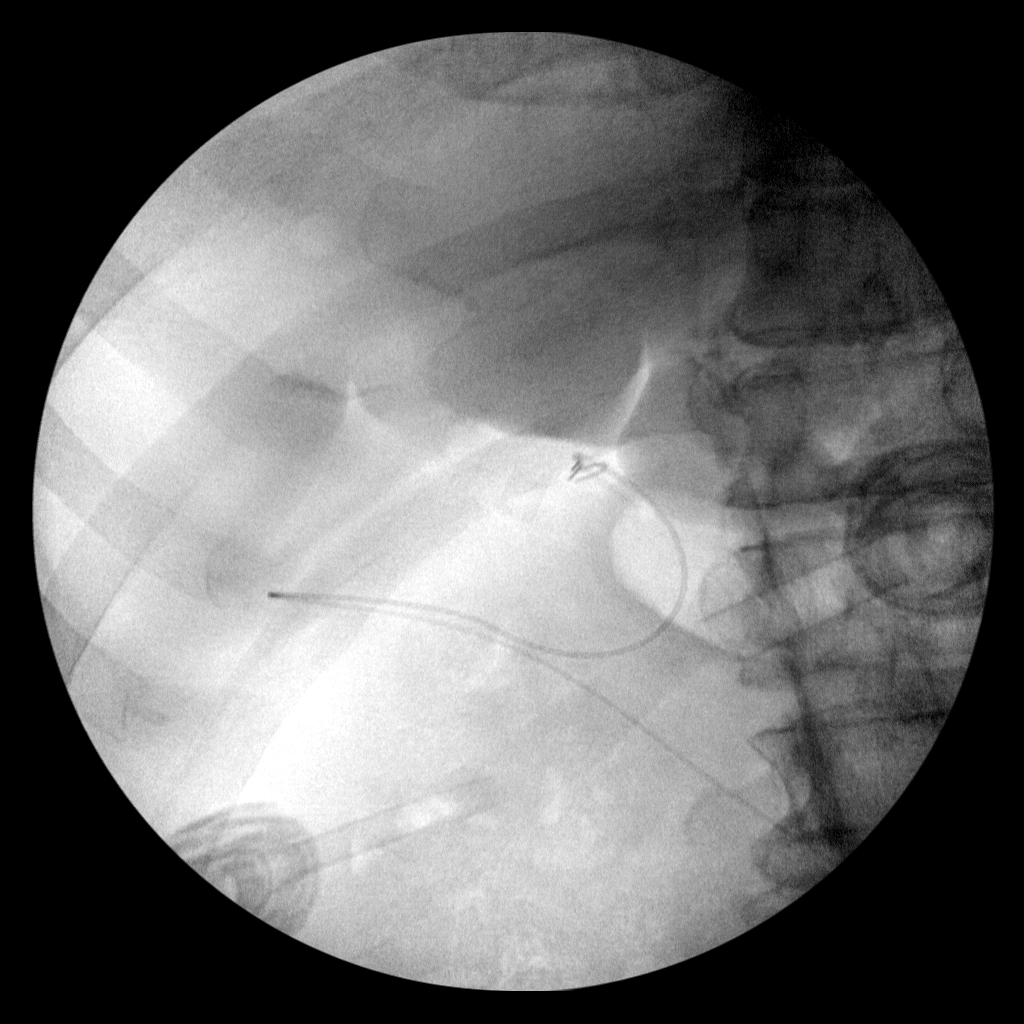
[im 2/2]
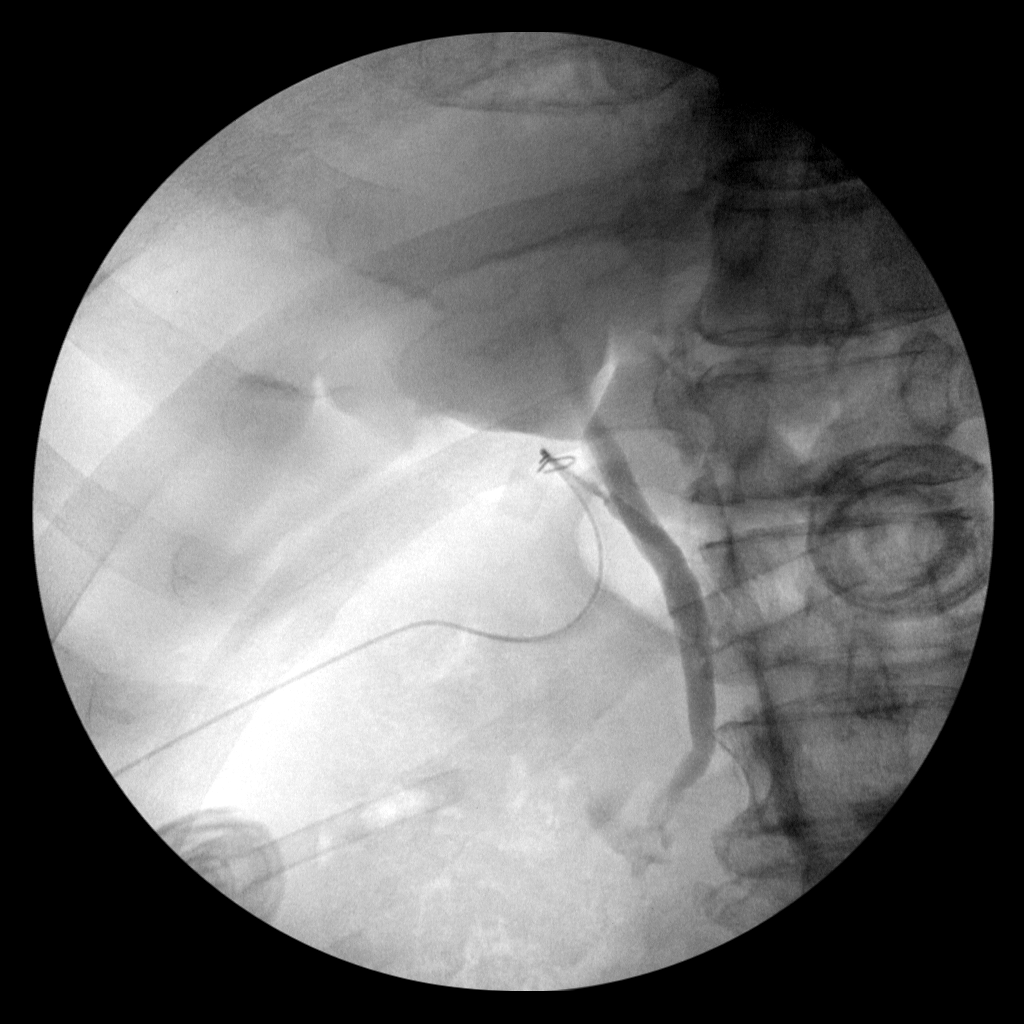
[im 2/2]
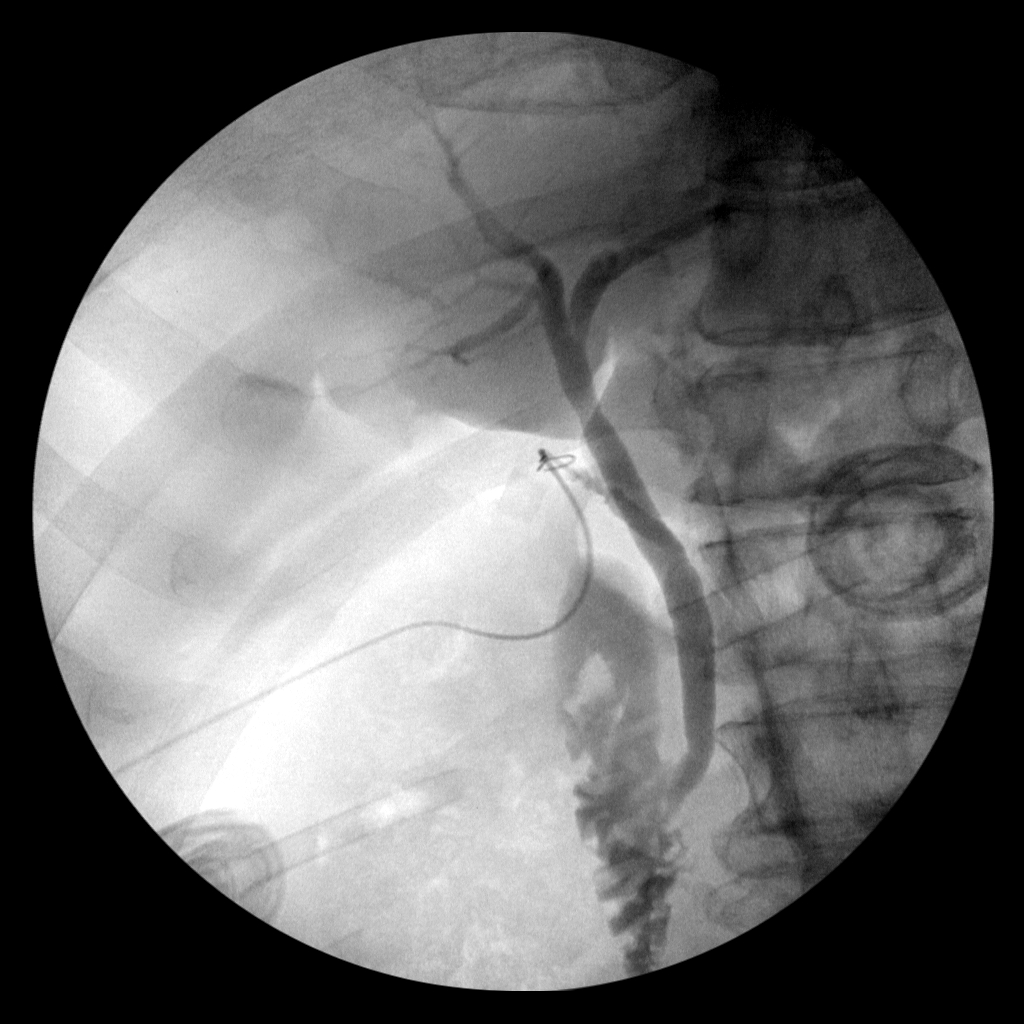
[im 2/2]
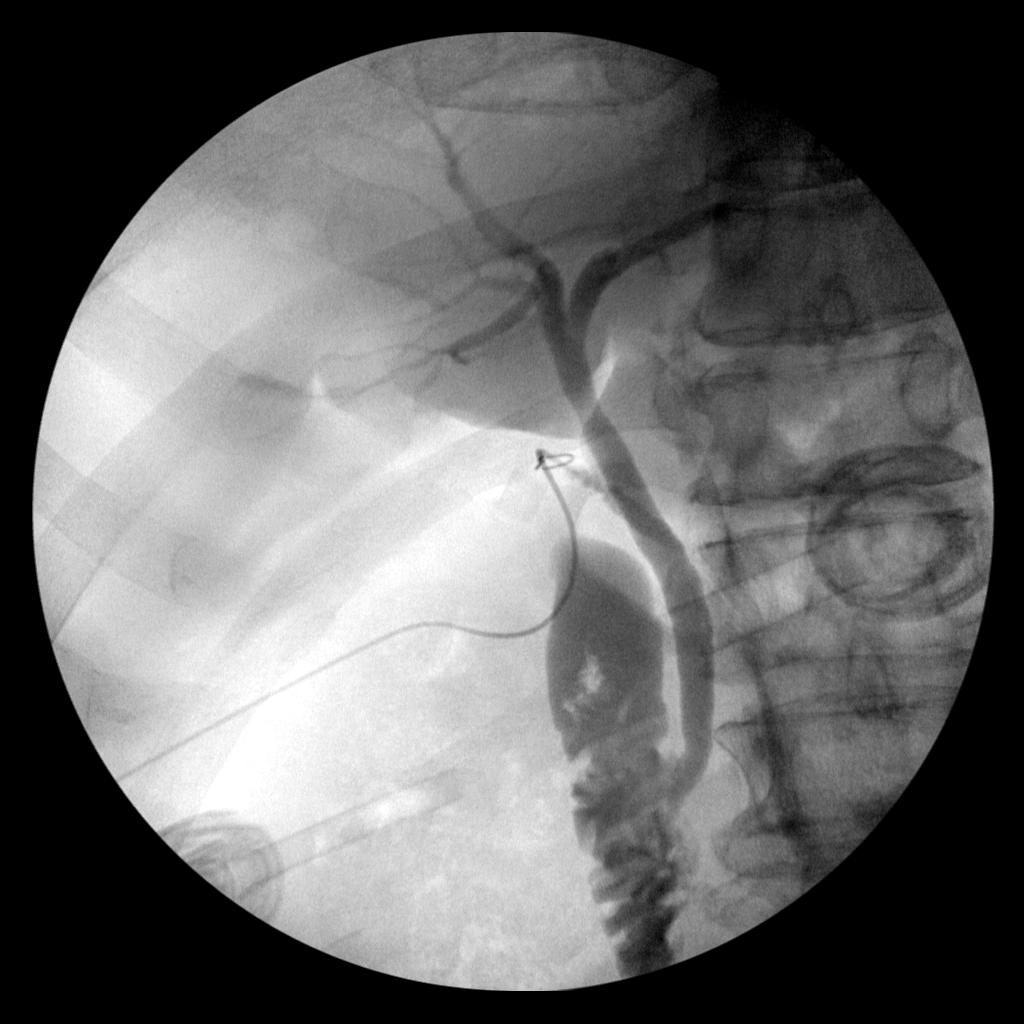
[im 2/2]
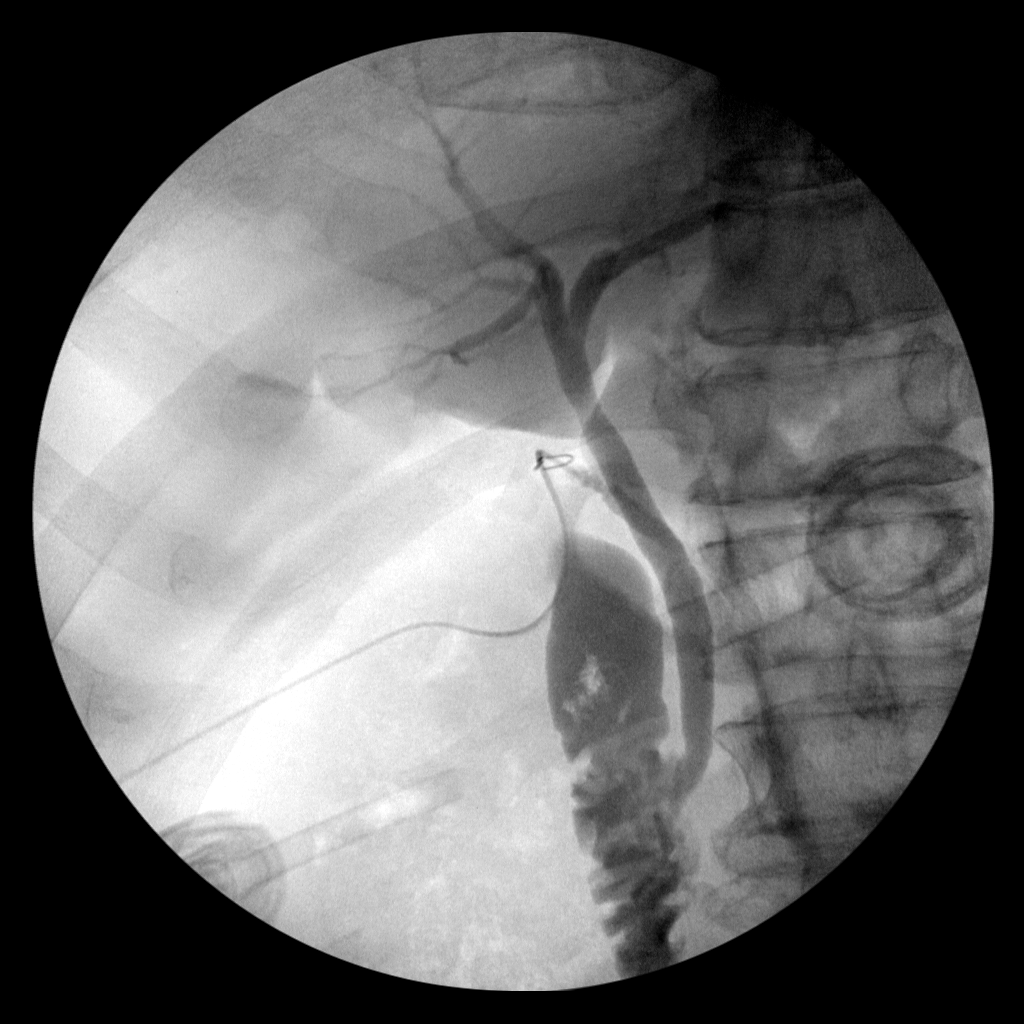

[5 of 5 positions shown; findings below may reference images not displayed]

FINDINGS: A cine lip this submitted for review. The images demonstrate
cannulation of the cystic duct remnant with opacification of the
biliary tree. No evidence of biliary ductal dilatation, stenosis,
stricture or choledocholithiasis. Contrast material passes freely
through the ampulla and into the duodenum.
IMPRESSION: Negative intraoperative cholangiogram.

## 2022-11-30 DIAGNOSIS — M25561 Pain in right knee: Secondary | ICD-10-CM | POA: Diagnosis not present

## 2022-11-30 DIAGNOSIS — M1711 Unilateral primary osteoarthritis, right knee: Secondary | ICD-10-CM | POA: Diagnosis not present

## 2022-12-15 DIAGNOSIS — H524 Presbyopia: Secondary | ICD-10-CM | POA: Diagnosis not present

## 2023-02-17 ENCOUNTER — Telehealth: Payer: Self-pay | Admitting: Family Medicine

## 2023-02-17 NOTE — Telephone Encounter (Signed)
Contacted Suezanne Jacquet II to schedule their annual wellness visit. Appointment made for 03/08/2023.  Gabriel Cirri [facility] TEAM Direct Dial [phone]

## 2023-03-08 ENCOUNTER — Ambulatory Visit (INDEPENDENT_AMBULATORY_CARE_PROVIDER_SITE_OTHER): Payer: Medicare HMO

## 2023-03-08 VITALS — Wt 164.0 lb

## 2023-03-08 DIAGNOSIS — Z0000 Encounter for general adult medical examination without abnormal findings: Secondary | ICD-10-CM

## 2023-03-08 NOTE — Progress Notes (Addendum)
I connected with  [name] [name] on 03/08/23 by a audio enabled telemedicine application and verified that I am speaking with the correct person using two identifiers.  Patient Location: Home  Provider Location: Office/Clinic  I discussed the limitations of evaluation and management by telemedicine. The patient expressed understanding and agreed to proceed.    Patient Medicare AWV questionnaire was completed by the patient on 03/07/23; I have confirmed that all information answered by patient is correct and no changes since this date.      Subjective:   [name] [name] is a 82 y.o. male who presents for Medicare Annual/Subsequent preventive examination.  Review of Systems     Cardiac Risk Factors include: advanced age (>66men, >14 women);male gender;dyslipidemia;hypertension     Objective:    Today's Vitals   03/08/23 0825  Weight: 164 lb (74.4 kg)   Body mass index is 24.94 kg/m.     03/08/2023    8:35 AM 03/05/2022    8:03 AM 01/12/2021   11:21 AM 01/09/2021    9:34 AM 01/28/2020   12:36 PM 05/14/2019    9:11 AM 03/02/2018   10:13 AM  Advanced Directives  Does Patient Have a Medical Advance Directive? Yes Yes Yes Yes Yes No No  Type of Estate agent of [place];Living will Healthcare Power of [place];Living will Healthcare Power of [place];Living will Healthcare Power of [place];Living will Living will;Healthcare Power of Attorney    Does patient want to make changes to medical advance directive?   No - Patient declined  No - Patient declined    Copy of Healthcare Power of Attorney in Chart? No - copy requested No - copy requested No - copy requested No - copy requested No - copy requested    Would patient like information on creating a medical advance directive?      Yes (MAU/Ambulatory/Procedural Areas - Information given)     Current Medications (verified) Outpatient Encounter Medications as of 03/08/2023  Medication Sig   aspirin 81 MG  tablet Take 1 tablet (81 mg total) by mouth daily.   atorvastatin (LIPITOR) 40 MG tablet Take 1 tablet (40 mg total) by mouth daily.   Calcium Carbonate-Vitamin D (CALCIUM + D PO) Take 1 tablet by mouth daily.   fluticasone (FLONASE) 50 MCG/ACT nasal spray Place 2 sprays into both nostrils daily. (Patient taking differently: Place 2 sprays into both nostrils daily as needed for allergies.)   glucosamine-chondroitin 500-400 MG tablet Take 1 tablet by mouth 2 (two) times daily.   melatonin 5 MG TABS Take 5 mg by mouth at bedtime.   metoprolol tartrate (LOPRESSOR) 25 MG tablet Take 1 tablet (25 mg total) by mouth 2 (two) times daily.   Multiple Vitamin (MULTIVITAMIN PO) Take 1 tablet by mouth daily.   Omega-3 Fatty Acids (FISH OIL) 1200 MG CAPS Take 1,200 mg by mouth daily.   omeprazole (PRILOSEC) 40 MG capsule Take 1 capsule (40 mg total) by mouth daily.   sildenafil (VIAGRA) 100 MG tablet Take 1 tablet (100 mg total) by mouth daily as needed for erectile dysfunction.   tamsulosin (FLOMAX) 0.4 MG CAPS capsule Take 1 capsule (0.4 mg total) by mouth daily.   traZODone (DESYREL) 50 MG tablet TAKE 1/2 TO 1 TABLET AT BEDTIME AS NEEDED FOR SLEEP   Influenza vac split quadrivalent PF (FLUZONE HIGH-DOSE) 0.5 ML injection inject 0.5 milliliter intramuscularly   No facility-administered encounter medications on file as of 03/08/2023.    Allergies (verified) Hydrocodone  and Simvastatin   History: Past Medical History:  Diagnosis Date   Anxiety    Coronary artery disease    status post PTCA and stenting of his left circumflex artery and right coronary artery   GERD (gastroesophageal reflux disease)    H/O heart artery stent    Hyperlipidemia    Hypertension    Pt denies   Reflux    omeprazole 40mg    Past Surgical History:  Procedure Laterality Date   Benign Cyst     on back   CARDIOVASCULAR STRESS TEST  05/09/2007   EF 64%   carpal tunnel left     CATARACT EXTRACTION     bilateral- april  15th with repeat 3 weeks later   CHOLECYSTECTOMY N/A 01/12/2021   Procedure: LAPAROSCOPIC CHOLECYSTECTOMY WITH INTRAOPERATIVE CHOLANGIOGRAM;  Surgeon: Darnell Level, MD;  Location: [facility];  Service: General;  Laterality: N/A;  90MIN/RM2   CORONARY ANGIOPLASTY WITH STENT PLACEMENT     CYSTECTOMY     removed from finger   cystoscopy     LIPOMA EXCISION     Back   right knee surgery     arthroscopyc 02/2010   Family History  Problem Relation Age of Onset   Bladder Cancer Mother        passed age 58.    Heart attack Father        5 passed from this   Alcohol abuse Father    Social History   Socioeconomic History   Marital status: Widowed    Spouse name: Not on file   Number of children: Not on file   Years of education: Not on file   Highest education level: Not on file  Occupational History   Not on file  Tobacco Use   Smoking status: Never   Smokeless tobacco: Never  Vaping Use   Vaping Use: Never used  Substance and Sexual Activity   Alcohol use: No   Drug use: No   Sexual activity: Not on file  Other Topics Concern   Not on file  Social History Narrative   Widowed march 2023.  3 grown children, 3 grown stepkids. 9 grandkids. [place] 3 in 2024.    Lives in [place] now      Retired from [employer]. Over 46 years.    Social Determinants of Health   Financial Resource Strain: Low Risk  (03/07/2023)   Overall Financial Resource Strain (CARDIA)    Difficulty of Paying Living Expenses: Not hard at all  Food Insecurity: No Food Insecurity (03/07/2023)   Hunger Vital Sign    Worried About Running Out of Food in the Last Year: Never true    Ran Out of Food in the Last Year: Never true  Transportation Needs: No Transportation Needs (03/07/2023)   PRAPARE - Administrator, Civil Service (Medical): No    Lack of Transportation (Non-Medical): No  Physical Activity: Sufficiently Active (03/07/2023)   Exercise Vital Sign    Days of Exercise per Week: 3 days     Minutes of Exercise per Session: 90 min  Stress: No Stress Concern Present (03/07/2023)   [employer] of Occupational Health - Occupational Stress Questionnaire    Feeling of Stress : Only a little  Social Connections: Moderately Isolated (03/07/2023)   Social Connection and Isolation Panel [NHANES]    Frequency of Communication with Friends and Family: More than three times a week    Frequency of Social Gatherings with Friends and Family: More than  three times a week    Attends Religious Services: Never    Active Member of Clubs or Organizations: Yes    Attends Banker Meetings: More than 4 times per year    Marital Status: Widowed    Tobacco Counseling Counseling given: Not Answered   Clinical Intake:  Pre-visit preparation completed: Yes  Pain : No/denies pain     BMI - recorded: 24.94 Nutritional Status: BMI of 19-24  Normal Nutritional Risks: None Diabetes: No  How often do you need to have someone help you when you read instructions, pamphlets, or other written materials from your doctor or pharmacy?: 1 - Never  Diabetic?no  Interpreter Needed?: No  Information entered by :: Lanier Ensign, LPN   Activities of Daily Living    03/07/2023    7:52 PM  In your present state of health, do you have any difficulty performing the following activities:  Hearing? 0  Vision? 0  Difficulty concentrating or making decisions? 0  Walking or climbing stairs? 0  Dressing or bathing? 0  Doing errands, shopping? 0  Preparing Food and eating ? N  Using the Toilet? N  In the past six months, have you accidently leaked urine? Y  Comment at times  Do you have problems with loss of bowel control? N  Managing your Medications? N  Managing your Finances? N  Housekeeping or managing your Housekeeping? N    Patient Care Team: Shelva Majestic, MD as PCP - General (Family Medicine) Nahser, Deloris Ping, MD as PCP - Cardiology (Cardiology) Blima Ledger, OD as  Consulting Physician (Optometry) Callie Fielding, MD as Consulting Physician (Physical Medicine and Rehabilitation) Rene Paci, MD as Consulting Physician (Urology) Jeani Hawking, MD as Consulting Physician (Gastroenterology)  Indicate any recent Medical Services you may have received from other than [facility] providers in the past year (date may be approximate).     Assessment:   This is a routine wellness examination for [name] [name].  Hearing/Vision screen Hearing Screening - Comments:: Pt denies any hearing issues  Vision Screening - Comments:: Pt follows up with Dr Blima Ledger for annual eye exams    Dietary issues and exercise activities discussed: Current Exercise Habits: Home exercise routine, Type of exercise: Other - see comments (bowling), Time (Minutes): > 60, Frequency (Times/Week): 3, Weekly Exercise (Minutes/Week): 0   Goals Addressed             This Visit's Progress    Patient Stated       Patient Stated       Patient Stated       Get back to exercising        Depression Screen    03/08/2023    8:33 AM 03/05/2022    8:02 AM 04/08/2021   10:22 AM 01/28/2020   12:37 PM 04/11/2019   10:00 AM 03/02/2018   10:05 AM 09/07/2017    8:25 AM  PHQ 2/9 Scores  PHQ - 2 Score 0 0 0 0 0 0 0    Fall Risk    03/07/2023    7:52 PM 03/05/2022    8:04 AM 04/08/2021   10:22 AM 01/28/2020   12:37 PM 01/28/2020   10:44 AM  Fall Risk   Falls in the past year? 0 1 0 0 0  Number falls in past yr: 0 1 1 0 0  Injury with Fall? 0 1 0 0 0  Comment  bruised hip     Risk for fall due to :  Impaired vision;Impaired balance/gait Impaired vision;Impaired balance/gait No Fall Risks    Follow up Falls prevention discussed Falls prevention discussed Falls evaluation completed Falls evaluation completed;Education provided;Falls prevention discussed     FALL RISK PREVENTION PERTAINING TO THE HOME:  Any stairs in or around the home? Yes  If so, are there any without handrails? No  Home free  of loose throw rugs in walkways, pet beds, electrical cords, etc? Yes  Adequate lighting in your home to reduce risk of falls? Yes   ASSISTIVE DEVICES UTILIZED TO PREVENT FALLS:  Life alert? No  Use of a cane, walker or w/c? No  Grab bars in the bathroom? Yes  Shower chair or bench in shower? Yes  Elevated toilet seat or a handicapped toilet? No   TIMED UP AND GO:  Was the test performed? No .   Cognitive Function:        03/08/2023    8:37 AM 03/05/2022    8:06 AM 01/28/2020   12:37 PM 05/14/2019    9:13 AM  6CIT Screen  What Year? 0 points 0 points 0 points 0 points  What month? 0 points 0 points 0 points 0 points  What time? 0 points 0 points 0 points 0 points  Count back from 20 0 points 0 points 0 points 0 points  Months in reverse 0 points 0 points 0 points 0 points  Repeat phrase 0 points 0 points 0 points 0 points  Total Score 0 points 0 points 0 points 0 points    Immunizations Immunization History  Administered Date(s) Administered   Fluad Quad(high Dose 65+) 06/13/2019, 06/24/2020, 08/18/2021   Influenza, High Dose Seasonal PF 08/28/2017   Influenza,inj,Quad PF,6+ Mos 06/18/2022   Influenza-Unspecified 07/28/2016   PFIZER(Purple Top)SARS-COV-2 Vaccination 10/15/2019, 11/04/2019, 07/11/2020   Pfizer Covid-19 Vaccine Bivalent Booster 66yrs & up 09/15/2021   Pneumococcal Conjugate-13 08/16/2014   Pneumococcal Polysaccharide-23 07/08/2010   Tdap 07/08/2010, 04/16/2022   Zoster Recombinat (Shingrix) 04/13/2022, 06/18/2022    TDAP status: Up to date  Flu Vaccine status: Up to date  Pneumococcal vaccine status: Up to date  Covid-19 vaccine status: Completed vaccines  Qualifies for Shingles Vaccine? Yes   Zostavax completed Yes   Shingrix Completed?: Yes  Screening Tests Health Maintenance  Topic Date Due   COVID-19 Vaccine (5 - 2023-24 season) 06/04/2022   INFLUENZA VACCINE  05/05/2023   Medicare Annual Wellness (AWV)  03/07/2024   Colonoscopy   02/11/2025   DTaP/Tdap/Td (3 - Td or Tdap) 04/16/2032   Pneumonia Vaccine 82+ Years old  Completed   Hepatitis C Screening  Completed   Zoster Vaccines- Shingrix  Completed   HPV VACCINES  Aged Out    Health Maintenance  Health Maintenance Due  Topic Date Due   COVID-19 Vaccine (5 - 2023-24 season) 06/04/2022    Colorectal cancer screening: Type of screening: Colonoscopy. Completed 02/12/20. Repeat every 5 years  Additional Screening:  Hepatitis C Screening:  Completed 04/08/21  Vision Screening: Recommended annual ophthalmology exams for early detection of glaucoma and other disorders of the eye. Is the patient up to date with their annual eye exam?  Yes  Who is the provider or what is the name of the office in which the patient attends annual eye exams? Dr Blima Ledger  If pt is not established with a provider, would they like to be referred to a provider to establish care? No .   Dental Screening: Recommended annual dental exams for proper oral hygiene  Community  Resource Referral / Chronic Care Management: CRR required this visit?  No   CCM required this visit?  No      Plan:     I have personally reviewed and noted the following in the patient's chart:   Medical and social history Use of alcohol, tobacco or illicit drugs  Current medications and supplements including opioid prescriptions. Patient is not currently taking opioid prescriptions. Functional ability and status Nutritional status Physical activity Advanced directives List of other physicians Hospitalizations, surgeries, and ER visits in previous 12 months Vitals Screenings to include cognitive, depression, and falls Referrals and appointments  In addition, I have reviewed and discussed with patient certain preventive protocols, quality metrics, and best practice recommendations. A written personalized care plan for preventive services as well as general preventive health recommendations were provided  to patient.     Marzella Schlein, LPN   10/09/1094   Nurse Notes: none

## 2023-03-08 NOTE — Patient Instructions (Signed)
[name] [name] , Thank you for taking time to come for your Medicare Wellness Visit. I appreciate your ongoing commitment to your health goals. Please review the following plan we discussed and let me know if I can assist you in the future.   These are the goals we discussed:  Goals      Exercise 150 min/wk Moderate Activity     Wants to work on getting back the [employer] activities in the near future      Patient Stated     Get back to exercise      Patient Stated     Patient Stated     Patient Stated     Get back to exercising      Weight (lb) < 188 lb (85.3 kg)     Stop eating late at hs. Curtail bread, sugar, sweets  Check out  online nutrition programs as WikiBlast.com.cy and LimitLaws.com.cy; fit58me; Look for foods with "whole" wheat; bran; oatmeal etc Shot at the farmer's markets in season for fresher choices  Watch for "hydrogenated" on the label of oils which are trans-fats.  Watch for "high fructose corn syrup" in snacks, yogurt or ketchup  Meats have less marbling; bright colored fruits and vegetables;  Canned; dump out liquid and wash vegetables. Be mindful of what we are eating  Portion control is essential to a health weight! Sit down; take a break and enjoy your meal; take smaller bites; put the fork down between bites;  It takes 20 minutes to get full; so check in with your fullness cues and stop eating when you start to fill full               This is a list of the screening recommended for you and due dates:  Health Maintenance  Topic Date Due   COVID-19 Vaccine (5 - 2023-24 season) 06/04/2022   Flu Shot  05/05/2023   Medicare Annual Wellness Visit  03/07/2024   Colon Cancer Screening  02/11/2025   DTaP/Tdap/Td vaccine (3 - Td or Tdap) 04/16/2032   Pneumonia Vaccine  Completed   Hepatitis C Screening  Completed   Zoster (Shingles) Vaccine  Completed   HPV Vaccine  Aged Out    Advanced directives: Please bring a copy of your health care power of  attorney and living will to the office at your convenience.  Conditions/risks identified: get to exercising  more   Next appointment: Follow up in one year for your annual wellness visit.   Preventive Care 3 Years and Older, Male  Preventive care refers to lifestyle choices and visits with your health care provider that can promote health and wellness. What does preventive care include? A yearly physical exam. This is also called an annual well check. Dental exams once or twice a year. Routine eye exams. Ask your health care provider how often you should have your eyes checked. Personal lifestyle choices, including: Daily care of your teeth and gums. Regular physical activity. Eating a healthy diet. Avoiding tobacco and drug use. Limiting alcohol use. Practicing safe sex. Taking low doses of aspirin every day. Taking vitamin and mineral supplements as recommended by your health care provider. What happens during an annual well check? The services and screenings done by your health care provider during your annual well check will depend on your age, overall health, lifestyle risk factors, and family history of disease. Counseling  Your health care provider may ask you questions about your: Alcohol use. Tobacco use. Drug use. Emotional  well-being. Home and relationship well-being. Sexual activity. Eating habits. History of falls. Memory and ability to understand (cognition). Work and work Astronomer. Screening  You may have the following tests or measurements: Height, weight, and BMI. Blood pressure. Lipid and cholesterol levels. These may be checked every 5 years, or more frequently if you are over 2 years old. Skin check. Lung cancer screening. You may have this screening every year starting at age 88 if you have a 30-pack-year history of smoking and currently smoke or have quit within the past 15 years. Fecal occult blood test (FOBT) of the stool. You may have this test  every year starting at age 42. Flexible sigmoidoscopy or colonoscopy. You may have a sigmoidoscopy every 5 years or a colonoscopy every 10 years starting at age 62. Prostate cancer screening. Recommendations will vary depending on your family history and other risks. Hepatitis C blood test. Hepatitis B blood test. Sexually transmitted disease (STD) testing. Diabetes screening. This is done by checking your blood sugar (glucose) after you have not eaten for a while (fasting). You may have this done every 1-3 years. Abdominal aortic aneurysm (AAA) screening. You may need this if you are a current or former smoker. Osteoporosis. You may be screened starting at age 27 if you are at high risk. Talk with your health care provider about your test results, treatment options, and if necessary, the need for more tests. Vaccines  Your health care provider may recommend certain vaccines, such as: Influenza vaccine. This is recommended every year. Tetanus, diphtheria, and acellular pertussis (Tdap, Td) vaccine. You may need a Td booster every 10 years. Zoster vaccine. You may need this after age 42. Pneumococcal 13-valent conjugate (PCV13) vaccine. One dose is recommended after age 64. Pneumococcal polysaccharide (PPSV23) vaccine. One dose is recommended after age 28. Talk to your health care provider about which screenings and vaccines you need and how often you need them. This information is not intended to replace advice given to you by your health care provider. Make sure you discuss any questions you have with your health care provider. Document Released: 10/17/2015 Document Revised: 06/09/2016 Document Reviewed: 07/22/2015 Elsevier Interactive Patient Education  2017 [employer].  Fall Prevention in the Home Falls can cause injuries. They can happen to people of all ages. There are many things you can do to make your home safe and to help prevent falls. What can I do on the outside of my  home? Regularly fix the edges of walkways and driveways and fix any cracks. Remove anything that might make you trip as you walk through a door, such as a raised step or threshold. Trim any bushes or trees on the path to your home. Use bright outdoor lighting. Clear any walking paths of anything that might make someone trip, such as rocks or tools. Regularly check to see if handrails are loose or broken. Make sure that both sides of any steps have handrails. Any raised decks and porches should have guardrails on the edges. Have any leaves, snow, or ice cleared regularly. Use sand or salt on walking paths during winter. Clean up any spills in your garage right away. This includes oil or grease spills. What can I do in the bathroom? Use night lights. Install grab bars by the toilet and in the tub and shower. Do not use towel bars as grab bars. Use non-skid mats or decals in the tub or shower. If you need to sit down in the shower, use a plastic,  non-slip stool. Keep the floor dry. Clean up any water that spills on the floor as soon as it happens. Remove soap buildup in the tub or shower regularly. Attach bath mats securely with double-sided non-slip rug tape. Do not have throw rugs and other things on the floor that can make you trip. What can I do in the bedroom? Use night lights. Make sure that you have a light by your bed that is easy to reach. Do not use any sheets or blankets that are too big for your bed. They should not hang down onto the floor. Have a firm chair that has side arms. You can use this for support while you get dressed. Do not have throw rugs and other things on the floor that can make you trip. What can I do in the kitchen? Clean up any spills right away. Avoid walking on wet floors. Keep items that you use a lot in easy-to-reach places. If you need to reach something above you, use a strong step stool that has a grab bar. Keep electrical cords out of the way. Do  not use floor polish or wax that makes floors slippery. If you must use wax, use non-skid floor wax. Do not have throw rugs and other things on the floor that can make you trip. What can I do with my stairs? Do not leave any items on the stairs. Make sure that there are handrails on both sides of the stairs and use them. Fix handrails that are broken or loose. Make sure that handrails are as long as the stairways. Check any carpeting to make sure that it is firmly attached to the stairs. Fix any carpet that is loose or worn. Avoid having throw rugs at the top or bottom of the stairs. If you do have throw rugs, attach them to the floor with carpet tape. Make sure that you have a light switch at the top of the stairs and the bottom of the stairs. If you do not have them, ask someone to add them for you. What else can I do to help prevent falls? Wear shoes that: Do not have high heels. Have rubber bottoms. Are comfortable and fit you well. Are closed at the toe. Do not wear sandals. If you use a stepladder: Make sure that it is fully opened. Do not climb a closed stepladder. Make sure that both sides of the stepladder are locked into place. Ask someone to hold it for you, if possible. Clearly mark and make sure that you can see: Any grab bars or handrails. First and last steps. Where the edge of each step is. Use tools that help you move around (mobility aids) if they are needed. These include: Canes. Walkers. Scooters. Crutches. Turn on the lights when you go into a dark area. Replace any light bulbs as soon as they burn out. Set up your furniture so you have a clear path. Avoid moving your furniture around. If any of your floors are uneven, fix them. If there are any pets around you, be aware of where they are. Review your medicines with your doctor. Some medicines can make you feel dizzy. This can increase your chance of falling. Ask your doctor what other things that you can do to  help prevent falls. This information is not intended to replace advice given to you by your health care provider. Make sure you discuss any questions you have with your health care provider. Document Released: 07/17/2009 Document Revised: 02/26/2016 Document  Reviewed: 10/25/2014 Elsevier Interactive Patient Education  2017 [employer].

## 2023-03-17 ENCOUNTER — Encounter: Payer: Self-pay | Admitting: Family Medicine

## 2023-03-17 ENCOUNTER — Ambulatory Visit (INDEPENDENT_AMBULATORY_CARE_PROVIDER_SITE_OTHER): Payer: Medicare HMO | Admitting: Family Medicine

## 2023-03-17 VITALS — BP 120/70 | HR 72 | Temp 97.0°F | Ht 68.0 in | Wt 164.0 lb

## 2023-03-17 DIAGNOSIS — I2510 Atherosclerotic heart disease of native coronary artery without angina pectoris: Secondary | ICD-10-CM | POA: Diagnosis not present

## 2023-03-17 DIAGNOSIS — R42 Dizziness and giddiness: Secondary | ICD-10-CM

## 2023-03-17 DIAGNOSIS — N401 Enlarged prostate with lower urinary tract symptoms: Secondary | ICD-10-CM | POA: Diagnosis not present

## 2023-03-17 DIAGNOSIS — I10 Essential (primary) hypertension: Secondary | ICD-10-CM

## 2023-03-17 DIAGNOSIS — R0789 Other chest pain: Secondary | ICD-10-CM

## 2023-03-17 DIAGNOSIS — E785 Hyperlipidemia, unspecified: Secondary | ICD-10-CM | POA: Diagnosis not present

## 2023-03-17 LAB — TSH: TSH: 0.66 u[IU]/mL (ref 0.35–5.50)

## 2023-03-17 LAB — CBC WITH DIFFERENTIAL/PLATELET
Basophils Absolute: 0 10*3/uL (ref 0.0–0.1)
Basophils Relative: 0.6 % (ref 0.0–3.0)
Eosinophils Absolute: 0.1 10*3/uL (ref 0.0–0.7)
Eosinophils Relative: 1.6 % (ref 0.0–5.0)
HCT: 44.2 % (ref 39.0–52.0)
Hemoglobin: 15 g/dL (ref 13.0–17.0)
Lymphocytes Relative: 27 % (ref 12.0–46.0)
Lymphs Abs: 1.7 10*3/uL (ref 0.7–4.0)
MCHC: 33.8 g/dL (ref 30.0–36.0)
MCV: 91.2 fl (ref 78.0–100.0)
Monocytes Absolute: 0.5 10*3/uL (ref 0.1–1.0)
Monocytes Relative: 8.3 % (ref 3.0–12.0)
Neutro Abs: 4 10*3/uL (ref 1.4–7.7)
Neutrophils Relative %: 62.5 % (ref 43.0–77.0)
Platelets: 204 10*3/uL (ref 150.0–400.0)
RBC: 4.85 Mil/uL (ref 4.22–5.81)
RDW: 13.2 % (ref 11.5–15.5)
WBC: 6.4 10*3/uL (ref 4.0–10.5)

## 2023-03-17 LAB — COMPREHENSIVE METABOLIC PANEL
ALT: 21 U/L (ref 0–53)
AST: 21 U/L (ref 0–37)
Albumin: 4.4 g/dL (ref 3.5–5.2)
Alkaline Phosphatase: 51 U/L (ref 39–117)
BUN: 18 mg/dL (ref 6–23)
CO2: 30 mEq/L (ref 19–32)
Calcium: 9.5 mg/dL (ref 8.4–10.5)
Chloride: 100 mEq/L (ref 96–112)
Creatinine, Ser: 0.78 mg/dL (ref 0.40–1.50)
GFR: 84.67 mL/min (ref >60.00)
Glucose, Bld: 94 mg/dL (ref 70–99)
Potassium: 4.5 mEq/L (ref 3.5–5.1)
Sodium: 136 mEq/L (ref 135–145)
Total Bilirubin: 0.8 mg/dL (ref 0.2–1.2)
Total Protein: 7.1 g/dL (ref 6.0–8.3)

## 2023-03-17 NOTE — Progress Notes (Signed)
Phone [phone] In person visit   Subjective:   [name] [name] is a 82 y.o. year old very pleasant male patient who presents for/with See problem oriented charting Chief Complaint  Patient presents with   Dizziness    Pt c/o dizziness that started about 2 weeks ago, he states this resulted from a fall a year ago, follows up with cardiology in sept.   Chest Pain    Pt c/o chest pressure at times with light headedness     Past Medical History-  Patient Active Problem List   Diagnosis Date Noted   CAD (coronary artery disease) 06/21/2011    Priority: High   Aortic atherosclerosis (HCC) 12/17/2019    Priority: Medium    Insomnia 04/11/2019    Priority: Medium    Claudication of lower extremity (HCC) 10/24/2018    Priority: Medium    BPH (benign prostatic hyperplasia) 09/06/2016    Priority: Medium    Essential hypertension 08/14/2016    Priority: Medium    Hyperlipidemia 06/21/2011    Priority: Medium    Cholelithiasis with chronic cholecystitis 01/12/2021    Priority: Low   Allergic rhinitis 01/28/2020    Priority: Low   Plantar fasciitis of left foot 06/21/2018    Priority: Low   Anxiety state 09/06/2016    Priority: Low   History of adenomatous polyp of colon 08/14/2016    Priority: Low   Vitamin D deficiency 08/14/2016    Priority: Low   GERD (gastroesophageal reflux disease)     Priority: Low   Thyromegaly 04/08/2021    Medications- reviewed and updated Current Outpatient Medications  Medication Sig Dispense Refill   aspirin 81 MG tablet Take 1 tablet (81 mg total) by mouth daily.     atorvastatin (LIPITOR) 40 MG tablet Take 1 tablet (40 mg total) by mouth daily. 90 tablet 3   Calcium Carbonate-Vitamin D (CALCIUM + D PO) Take 1 tablet by mouth daily.     fluticasone (FLONASE) 50 MCG/ACT nasal spray Place 2 sprays into both nostrils daily. (Patient taking differently: Place 2 sprays into both nostrils daily as needed for allergies.) 48 g 1    glucosamine-chondroitin 500-400 MG tablet Take 1 tablet by mouth 2 (two) times daily.     melatonin 5 MG TABS Take 5 mg by mouth at bedtime.     metoprolol tartrate (LOPRESSOR) 25 MG tablet Take 1 tablet (25 mg total) by mouth 2 (two) times daily. 180 tablet 3   Multiple Vitamin (MULTIVITAMIN PO) Take 1 tablet by mouth daily.     Omega-3 Fatty Acids (FISH OIL) 1200 MG CAPS Take 1,200 mg by mouth daily.     omeprazole (PRILOSEC) 40 MG capsule Take 1 capsule (40 mg total) by mouth daily. 90 capsule 3   sildenafil (VIAGRA) 100 MG tablet Take 1 tablet (100 mg total) by mouth daily as needed for erectile dysfunction. 30 tablet 3   tamsulosin (FLOMAX) 0.4 MG CAPS capsule Take 1 capsule (0.4 mg total) by mouth daily. 90 capsule 3   traZODone (DESYREL) 50 MG tablet TAKE 1/2 TO 1 TABLET AT BEDTIME AS NEEDED FOR SLEEP 90 tablet 3   No current facility-administered medications for this visit.     Objective:  BP 120/70   Pulse 72   Temp (!) 97 F (36.1 C)   Ht 5\' 8"  (1.727 m)   Wt 164 lb (74.4 kg)   SpO2 98%   BMI 24.94 kg/m  Gen: NAD, resting comfortably Stable thyromegaly  CV: RRR no murmurs rubs or gallops Lungs: CTAB no crackles, wheeze, rhonchi Abdomen: soft/nontender/nondistended/normal bowel sounds. No rebound or guarding.  Ext: no edema Skin: warm, dry Neuro: CN [name]-XII intact, sensation and reflexes normal throughout, 5/5 muscle strength in bilateral upper and lower extremities. Normal finger to nose. Normal rapid alternating movements. No pronator drift. Normal romberg. Normal gait.   EKG: sinus rhythm with rate 71, normal axis, normal intervals, no hypertrophy, no st or t wave changes- <1 box elevation v1-v6 in st segment     Assessment and Plan   # vertigo #chest pressure S:patient reports symptoms started 2 weeks ago. Came home from bowling- mailbox had been knocked down- went back to house and got what he needed to fix it but ultimately decided would need to work next day.  Went to [employer] to get what he needed- when he bent over to pick mailbox up and when he stood up he felt vertigo- spinning sensation and had to lean up against his car- without this would have fallen to ground- was able to get into his seat in the car- saturation there few minutes before driving into the home.  He was able to bowl the next day.  No chest pain or shortness of breath with episode.    For the week after even though room was not spinning just felt general lightheadedness/dizzy even at rest- no room motion. Does not feel like on a boat with walking.   Occasional central chest pressure  every 2-3 days for last 2-3 months but not associated with dizziness- predated dizziness and vertigo. He was nto having this discomfort when saw cardiology 06/09/22. Just a few seconds of pressure - usually happens with activity. No shortness of breath or palpations with any of this. No left arm or neck pain. Sees cardiology in September. Has not checked heart rate when having dizziness but is feeling mildly dizzy constantly including today and heart rate normal. Since the first episode has tried to drink more water and has felt improved overall- no more episodes that severe as the first one- and no recurrent vertigo. Was more active today with gardening and dizziness slightly worse today.  He had a fall about a yaer ago and wondered if they could be related- had gotten up and was dizzy at night and fell down. Occasionally he will also have some chest pressure with the dizziness. Only mild increase with standing. No headache(s).  A/P: 82 year old male with significant episode of vertigo about 2 weeks ago similar to an episode he had about a year prior-since that time with persistent low-grade but improving dizziness with reassuring neurological exam today - Update labs including CBC, CMP, TSH (history of thyromegaly) -EKG reassuring while experiencing dizziness and no palpitations-do not think cardiac monitoring  would be helpful - Severe vertigo was positional related and could have been BPPV but with persistent even low-grade dizziness discussed doing MRI to rule out stroke-he declines at this time but agrees to let me know if new or worsening symptoms - Since symptoms are better with hydration wants to trial that first and proceed with throat imaging only if labs okay and symptoms fail to improve or worsen -Another idea if dizziness persistent at follow-up would be to consider B12 testing-thought about this after the visit -For chest pressure discussion-see CAD section below  #CAD-follows with Dr. Elease Hashimoto with history of stent  #hyperlipidemia S: Medication: atorvastatin 40 mg ,asa 81 mg, metoprolol 25 mg twice daily Lab Results  Component Value Date   CHOL 102 04/13/2022   HDL 42.90 04/13/2022   LDLCALC 50 04/13/2022   LDLDIRECT 58.0 04/08/2021   TRIG 45.0 04/13/2022   CHOLHDL 2 04/13/2022   A/P:  CAD-largely asymptomatic-intermittent twinges of chest pressure not associated with recent dizziness or vertigo and predating those but they do seem to be associated with exertion.  EKG largely reassuring today but still recommended calling to schedule earlier follow-up with cardiology for their opinion hyperlipidemia-at goal with LDL under 70-continue current medication #hypertension S: medication: Metoprolol 25 mg twice daily A/P: Blood pressure well-controlled and orthostatic element to symptoms are very minimal-continue current medication.  No bradycardia to suggest need to reduce metoprolol  #BPH S: Medication: Tamsulosin 0.4 mg A/P: Reasonable control-continue current medication-could contribute to orthostatic symptoms but does seem to be minimal  Recommended follow up: Return for next already scheduled visit or sooner if needed. Future Appointments  Date Time Provider Department Center  04/19/2023  8:00 AM Shelva Majestic, MD [facility]  06/08/2023  9:40 AM Nahser, Deloris Ping, MD  [facility]  03/13/2024  8:45 AM [facility] ANNUAL WELLNESS VISIT 1 [facility]    Lab/Order associations:   ICD-10-CM   1. Chest pressure  R07.89 EKG 12-Lead    2. Dizziness  R42     3. Essential hypertension  I10 Comprehensive metabolic panel    CBC with Differential/Platelet    TSH    4. Coronary artery disease involving native coronary artery of native heart without angina pectoris  I25.10     5. Hyperlipidemia, unspecified hyperlipidemia type  E78.5     6. Benign prostatic hyperplasia with lower urinary tract symptoms, symptom details unspecified  N40.1       No orders of the defined types were placed in this encounter.   Return precautions advised.  [name], MD

## 2023-03-17 NOTE — Patient Instructions (Addendum)
Please stop by lab before you go If you have mychart- we will send your results within 3 business days of [place] receiving them.  If you do not have mychart- we will call you about results within 5 business days of [place] receiving them.  *please also note that you will see labs on mychart as soon as they post. I will later go in and write notes on them- will say "notes from Dr. Durene Cal"   Call cardiology for follow up - I want their opinion on the chest pressure issue though glad it does not line up with dizziness  We also discussed even with normal neurological exam considering MRI brain to rule out stroke with persistent dizziness but since it is overall mild and improving you wanted to hold off for now unless worsens, other symptoms occur, or not continuing- you will reach out to me  Recommended follow up: Return for next already scheduled visit or sooner if needed. - new or worsening symptoms- seek care immediately

## 2023-03-21 ENCOUNTER — Encounter: Payer: Self-pay | Admitting: Cardiovascular Disease

## 2023-03-21 NOTE — H&P (View-Only) (Signed)
 Cardiology Office Note   Date:  03/22/2023   ID:  [name] [name], DOB 06/19/1941, MRN [id number]  PCP:  Hunter, Stephen O, MD  Cardiologist:   Murdock Jellison, MD   Chief Complaint  Patient presents with   Coronary Artery Disease   1. Coronary artery disease-status post PTCA and stenting of his left circumflex artery and right coronary artery 2. Hyperlipidemia    Previous notes:  82-year-old gentleman with a history of coronary artery disease. He status post PTCA and stenting of the left circumflex artery as well as PTCA of  the posterior descending artery and January 2002. He's done very well. He has not had any episodes of chest pain or shortness of breath.  January 16, 2014:  [name] [name] feels well - no angina, no dyspnea.    He is watching his diet   January 21, 2015:  [name] [name] is a 81 y.o. male who presents for follow-up of his coronary artery disease. Still bowling  [facility]  Does some cardio.   January 21, 2016:  [name] [name] is seen today for his CAD.    Has some muscle stiffness.  No muscle aches.  Has done some stretches.   January 27, 2017:  Having some muscle pull issus Having some ortho issues.  No CP  Is now changing Dr. Steven Hunter at [facility].  Has stopped his Niacin .   Still goes to the [employer].  Does light cardio and some machines.   March 17, 2018:  No CP , some dyspnea.   Especially with walking . Has some thigh pain with exercise.   Does not walk regularly  [facility] 6 [facility] a [facility].   Rides a bike, walks on treadmill , works out on weight machine , seated ellipitical   Aug. 9, 2021: [name] [name] is seen for follow up of his CAD, HLD  Still very active. Bowls regularly  No CP , no dyspnea   Labs from Jan were reviewed  Wt today is 168.  Has lost weight on purpose - 25 lbs over the past 2 years.   Sept. 6, 2022: [name] [name] is seen today for follow up of his CAD, HLD, HTN. Has had cataract surgery ,  gall bladder surgery    No angina ,    Depressed by the passing of his wife in March   No CP, getting exercise , bowls 3 days a week   Lipid levels drawn by Dr. Hunter in July, 2023 reveal a total cholesterol of 102 HDL is 42.9 LDL is 50 Triglyceride level is 45.  Hemoglobin is 14.6, potassium is 4.7.  Glucose is 88.  March 22, 2023   [name] [name] is seen for follow up of his CAD, HLD , HTN Has been having some orthostasis ( dizziness when he stood up while fixing the mailbox)   Rare episodes of mid chest discomfort  - does not feel like his angina  Does worsen with exertion , gets better with rest   Past Medical History:  Diagnosis Date   Anxiety    Coronary artery disease    status post PTCA and stenting of his left circumflex artery and right coronary artery   GERD (gastroesophageal reflux disease)    H/O heart artery stent    Hyperlipidemia    Hypertension    Pt denies   Reflux    omeprazole 40mg    Past Surgical History:  Procedure Laterality Date     Benign Cyst     on back   CARDIOVASCULAR STRESS TEST  05/09/2007   EF 64%   carpal tunnel left     CATARACT EXTRACTION     bilateral- april 15th with repeat 3 weeks later   CHOLECYSTECTOMY N/A 01/12/2021   Procedure: LAPAROSCOPIC CHOLECYSTECTOMY WITH INTRAOPERATIVE CHOLANGIOGRAM;  Surgeon: Gerkin, Todd, MD;  Location: [facility];  Service: General;  Laterality: N/A;  90MIN/RM2   CORONARY ANGIOPLASTY WITH STENT PLACEMENT     CYSTECTOMY     removed from finger   cystoscopy     LIPOMA EXCISION     Back   right knee surgery     arthroscopyc 02/2010     Current Outpatient Medications  Medication Sig Dispense Refill   aspirin 81 MG tablet Take 1 tablet (81 mg total) by mouth daily.     atorvastatin (LIPITOR) 40 MG tablet Take 1 tablet (40 mg total) by mouth daily. 90 tablet 3   Calcium Carbonate-Vitamin D (CALCIUM + D PO) Take 1 tablet by mouth daily.     fluticasone (FLONASE) 50 MCG/ACT nasal spray Place 2 sprays into both nostrils daily.  (Patient taking differently: Place 2 sprays into both nostrils daily as needed for allergies.) 48 g 1   glucosamine-chondroitin 500-400 MG tablet Take 1 tablet by mouth 2 (two) times daily.     melatonin 5 MG TABS Take 5 mg by mouth at bedtime.     metoprolol tartrate (LOPRESSOR) 25 MG tablet Take 1 tablet (25 mg total) by mouth 2 (two) times daily. 180 tablet 3   Multiple Vitamin (MULTIVITAMIN PO) Take 1 tablet by mouth daily.     Omega-3 Fatty Acids (FISH OIL) 1200 MG CAPS Take 1,200 mg by mouth daily.     omeprazole (PRILOSEC) 40 MG capsule Take 1 capsule (40 mg total) by mouth daily. 90 capsule 3   sildenafil (VIAGRA) 100 MG tablet Take 1 tablet (100 mg total) by mouth daily as needed for erectile dysfunction. 30 tablet 3   tamsulosin (FLOMAX) 0.4 MG CAPS capsule Take 1 capsule (0.4 mg total) by mouth daily. 90 capsule 3   traZODone (DESYREL) 50 MG tablet TAKE 1/2 TO 1 TABLET AT BEDTIME AS NEEDED FOR SLEEP (Patient taking differently: Take 50 mg by mouth at bedtime.) 90 tablet 3   No current facility-administered medications for this visit.    Allergies:   Hydrocodone and Simvastatin    Social History:  The patient  reports that he has never smoked. He has never used smokeless tobacco. He reports that he does not drink alcohol and does not use drugs.   Family History:  The patient's family history includes Alcohol abuse in his father; Bladder Cancer in his mother; Heart attack in his father.    ROS: Noted in current history, otherwise review of systems is negative.   Physical Exam: Blood pressure 120/70, pulse 73, height 5' 8" (1.727 m), weight 165 lb 12.8 oz (75.2 kg), SpO2 96 %.       GEN:  Well nourished, well developed in no acute distress HEENT: Normal NECK: No JVD; No carotid bruits LYMPHATICS: No lymphadenopathy CARDIAC: RRR , no murmurs, rubs, gallops RESPIRATORY:  Clear to auscultation without rales, wheezing or rhonchi  ABDOMEN: Soft, non-tender,  non-distended MUSCULOSKELETAL:  No edema; No deformity  SKIN: Warm and dry NEUROLOGIC:  Alert and oriented x 3    EKG:         Recent Labs: 03/17/2023: ALT 21; BUN 18; Creatinine, Ser 0.78;   Hemoglobin 15.0; Platelets 204.0; Potassium 4.5; Sodium 136; TSH 0.66    Lipid Panel    Component Value Date/Time   CHOL 102 04/13/2022 0859   CHOL 110 06/09/2021 0936   TRIG 45.0 04/13/2022 0859   HDL 42.90 04/13/2022 0859   HDL 47 06/09/2021 0936   CHOLHDL 2 04/13/2022 0859   VLDL 9.0 04/13/2022 0859   LDLCALC 50 04/13/2022 0859   LDLCALC 51 06/09/2021 0936   LDLDIRECT 58.0 04/08/2021 1140      Wt Readings from Last 3 Encounters:  03/22/23 165 lb 12.8 oz (75.2 kg)  03/17/23 164 lb (74.4 kg)  03/08/23 164 lb (74.4 kg)      Other studies Reviewed: Additional studies/ records that were reviewed today include: . Review of the above records demonstrates:    ASSESSMENT AND PLAN:  1. Coronary artery disease-   he has been having some episodes of chest discomfort.  The symptoms are not all that similar to his previous episodes of angina but they do worsen with exertion and get better with rest.  Will refill his nitroglycerin. Will order a Lexiscan myoview   Will keep his appt with me in Sept.    2. Hyperlipidemia -    check lipids tomorrow ,  last lipids look good       Current medicines are reviewed at length with the patient today.  The patient does not have concerns regarding medicines.  The following changes have been made:  no change  Labs/ tests ordered today include:   Orders Placed This Encounter  Procedures   EKG 12-Lead   EKG 12-Lead      Disposition:         Signed, Haroldine Redler, MD  03/22/2023 2:31 PM    [facility] [address], [place], [place]  [address] Phone: [phone]; Fax: [phone]  

## 2023-03-21 NOTE — Progress Notes (Signed)
Cardiology Office Note   Date:  03/22/2023   ID:  CONAL, TREHARNE 1941-04-27, MRN [id number]  PCP:  Shelva Majestic, MD  Cardiologist:   [name], MD   Chief Complaint  Patient presents with   Coronary Artery Disease   1. Coronary artery disease-status post PTCA and stenting of his left circumflex artery and right coronary artery 2. Hyperlipidemia    Previous notes:  81 year old gentleman with a history of coronary artery disease. He status post PTCA and stenting of the left circumflex artery as well as PTCA of  the posterior descending artery and January 2002. He's done very well. He has not had any episodes of chest pain or shortness of breath.  January 16, 2014:  Keimarion feels well - no angina, no dyspnea.    He is watching his diet   January 21, 2015:  Tayvon Yousuf II is a 82 y.o. male who presents for follow-up of his coronary artery disease. Still bowling  [facility]  Does some cardio.   January 21, 2016:  Unique is seen today for his CAD.    Has some muscle stiffness.  No muscle aches.  Has done some stretches.   January 27, 2017:  Having some muscle pull issus Having some ortho issues.  No CP  Is now changing Dr. [name] at [facility].  Has stopped his Niacin .   Still goes to the [facility].  Does light cardio and some machines.   March 17, 2018:  No CP , some dyspnea.   Especially with walking . Has some thigh pain with exercise.   Does not walk regularly  [facility] 6 [facility].   Rides a bike, walks on treadmill , works out on [employer] , seated ellipitical   Aug. 9, 2021: Jaun is seen for follow up of his CAD, HLD  Still very active. Bowls regularly  No CP , no dyspnea   Labs from Jan were reviewed  Wt today is 168.  Has lost weight on purpose - 25 lbs over the past 2 years.   Sept. 6, 2022: Eura is seen today for follow up of his CAD, HLD, HTN. Has had cataract surgery ,  gall bladder surgery    No angina ,    Depressed by the passing of his wife in March   No CP, getting exercise , bowls 3 days a week   Lipid levels drawn by Dr. Durene Cal in July, 2023 reveal a total cholesterol of 102 HDL is 42.9 LDL is 50 Triglyceride level is 45.  Hemoglobin is 14.6, potassium is 4.7.  Glucose is 88.  March 22, 2023   Rashan is seen for follow up of his CAD, HLD , HTN Has been having some orthostasis ( dizziness when he stood up while fixing the mailbox)   Rare episodes of mid chest discomfort  - does not feel like his angina  Does worsen with exertion , gets better with rest   Past Medical History:  Diagnosis Date   Anxiety    Coronary artery disease    status post PTCA and stenting of his left circumflex artery and right coronary artery   GERD (gastroesophageal reflux disease)    H/O heart artery stent    Hyperlipidemia    Hypertension    Pt denies   Reflux    omeprazole 40mg     Past Surgical History:  Procedure Laterality Date  Benign Cyst     on back   CARDIOVASCULAR STRESS TEST  05/09/2007   EF 64%   carpal tunnel left     CATARACT EXTRACTION     bilateral- april 15th with repeat 3 weeks later   CHOLECYSTECTOMY N/A 01/12/2021   Procedure: LAPAROSCOPIC CHOLECYSTECTOMY WITH INTRAOPERATIVE CHOLANGIOGRAM;  Surgeon: Darnell Level, MD;  Location: [facility];  Service: General;  Laterality: N/A;  90MIN/RM2   CORONARY ANGIOPLASTY WITH STENT PLACEMENT     CYSTECTOMY     removed from finger   cystoscopy     LIPOMA EXCISION     Back   right knee surgery     arthroscopyc 02/2010     Current Outpatient Medications  Medication Sig Dispense Refill   aspirin 81 MG tablet Take 1 tablet (81 mg total) by mouth daily.     atorvastatin (LIPITOR) 40 MG tablet Take 1 tablet (40 mg total) by mouth daily. 90 tablet 3   Calcium Carbonate-Vitamin D (CALCIUM + D PO) Take 1 tablet by mouth daily.     fluticasone (FLONASE) 50 MCG/ACT nasal spray Place 2 sprays into both nostrils daily.  (Patient taking differently: Place 2 sprays into both nostrils daily as needed for allergies.) 48 g 1   glucosamine-chondroitin 500-400 MG tablet Take 1 tablet by mouth 2 (two) times daily.     melatonin 5 MG TABS Take 5 mg by mouth at bedtime.     metoprolol tartrate (LOPRESSOR) 25 MG tablet Take 1 tablet (25 mg total) by mouth 2 (two) times daily. 180 tablet 3   Multiple Vitamin (MULTIVITAMIN PO) Take 1 tablet by mouth daily.     Omega-3 Fatty Acids (FISH OIL) 1200 MG CAPS Take 1,200 mg by mouth daily.     omeprazole (PRILOSEC) 40 MG capsule Take 1 capsule (40 mg total) by mouth daily. 90 capsule 3   sildenafil (VIAGRA) 100 MG tablet Take 1 tablet (100 mg total) by mouth daily as needed for erectile dysfunction. 30 tablet 3   tamsulosin (FLOMAX) 0.4 MG CAPS capsule Take 1 capsule (0.4 mg total) by mouth daily. 90 capsule 3   traZODone (DESYREL) 50 MG tablet TAKE 1/2 TO 1 TABLET AT BEDTIME AS NEEDED FOR SLEEP (Patient taking differently: Take 50 mg by mouth at bedtime.) 90 tablet 3   No current facility-administered medications for this visit.    Allergies:   Hydrocodone and Simvastatin    Social History:  The patient  reports that he has never smoked. He has never used smokeless tobacco. He reports that he does not drink alcohol and does not use drugs.   Family History:  The patient's family history includes Alcohol abuse in his father; Bladder Cancer in his mother; Heart attack in his father.    ROS: Noted in current history, otherwise review of systems is negative.   Physical Exam: Blood pressure 120/70, pulse 73, height 5\' 8"  (1.727 m), weight 165 lb 12.8 oz (75.2 kg), SpO2 96 %.       GEN:  Well nourished, well developed in no acute distress HEENT: Normal NECK: No JVD; No carotid bruits LYMPHATICS: No lymphadenopathy CARDIAC: RRR , no murmurs, rubs, gallops RESPIRATORY:  Clear to auscultation without rales, wheezing or rhonchi  ABDOMEN: Soft, non-tender,  non-distended MUSCULOSKELETAL:  No edema; No deformity  SKIN: Warm and dry NEUROLOGIC:  Alert and oriented x 3    EKG:         Recent Labs: 03/17/2023: ALT 21; BUN 18; Creatinine, Ser 0.78;  Hemoglobin 15.0; Platelets 204.0; Potassium 4.5; Sodium 136; TSH 0.66    Lipid Panel    Component Value Date/Time   CHOL 102 04/13/2022 0859   CHOL 110 06/09/2021 0936   TRIG 45.0 04/13/2022 0859   HDL 42.90 04/13/2022 0859   HDL 47 06/09/2021 0936   CHOLHDL 2 04/13/2022 0859   VLDL 9.0 04/13/2022 0859   LDLCALC 50 04/13/2022 0859   LDLCALC 51 06/09/2021 0936   LDLDIRECT 58.0 04/08/2021 1140      Wt Readings from Last 3 Encounters:  03/22/23 165 lb 12.8 oz (75.2 kg)  03/17/23 164 lb (74.4 kg)  03/08/23 164 lb (74.4 kg)      Other studies Reviewed: Additional studies/ records that were reviewed today include: . Review of the above records demonstrates:    ASSESSMENT AND PLAN:  1. Coronary artery disease-   he has been having some episodes of chest discomfort.  The symptoms are not all that similar to his previous episodes of angina but they do worsen with exertion and get better with rest.  Will refill his nitroglycerin. Will order a Steffanie Dunn   Will keep his appt with me in Sept.    2. Hyperlipidemia -    check lipids tomorrow ,  last lipids look good       Current medicines are reviewed at length with the patient today.  The patient does not have concerns regarding medicines.  The following changes have been made:  no change  Labs/ tests ordered today include:   Orders Placed This Encounter  Procedures   EKG 12-Lead   EKG 12-Lead      Disposition:         Signed, [name], MD  03/22/2023 2:31 PM    [facility] [address] [place], [place], [place]  [address] Phone: [phone]; Fax: [phone]

## 2023-03-22 ENCOUNTER — Ambulatory Visit: Payer: Medicare HMO | Attending: Cardiovascular Disease | Admitting: Cardiovascular Disease

## 2023-03-22 ENCOUNTER — Encounter: Payer: Self-pay | Admitting: Cardiovascular Disease

## 2023-03-22 ENCOUNTER — Other Ambulatory Visit: Payer: Self-pay | Admitting: Cardiovascular Disease

## 2023-03-22 VITALS — BP 120/70 | HR 73 | Ht 68.0 in | Wt 165.8 lb

## 2023-03-22 DIAGNOSIS — I2510 Atherosclerotic heart disease of native coronary artery without angina pectoris: Secondary | ICD-10-CM

## 2023-03-22 DIAGNOSIS — R079 Chest pain, unspecified: Secondary | ICD-10-CM | POA: Insufficient documentation

## 2023-03-22 DIAGNOSIS — R0789 Other chest pain: Secondary | ICD-10-CM

## 2023-03-22 MED ORDER — NITROGLYCERIN 0.4 MG SL SUBL
0.4000 mg | SUBLINGUAL_TABLET | SUBLINGUAL | 6 refills | Status: AC | PRN
Start: 2023-03-22 — End: ?

## 2023-03-22 NOTE — Patient Instructions (Signed)
Medication Instructions:  Your physician recommends that you continue on your current medications as directed. Please refer to the Current Medication list given to you today.  *If you need a refill on your cardiac medications before your next appointment, please call your pharmacy*   Lab Work: Lipid lab tomorrow. Come fasting. If you have labs (blood work) drawn today and your tests are completely normal, you will receive your results only by: MyChart Message (if you have MyChart) OR A paper copy in the mail If you have any lab test that is abnormal or we need to change your treatment, we will call you to review the results.   Testing/Procedures: Lexiscan stress test Your physician has requested that you have a lexiscan myoview. For further information please visit [email or web address] Please follow instruction sheet, as given.    Follow-Up: At [facility], you and your health needs are our priority.  As part of our continuing mission to provide you with exceptional heart care, we have created designated Provider Care Teams.  These Care Teams include your primary Cardiologist (physician) and Advanced Practice Providers (APPs -  Physician Assistants and Nurse Practitioners) who all work together to provide you with the care you need, when you need it.  Your next appointment:   As scheduled.

## 2023-03-23 ENCOUNTER — Ambulatory Visit: Payer: Medicare HMO | Attending: Cardiovascular Disease

## 2023-03-23 DIAGNOSIS — R079 Chest pain, unspecified: Secondary | ICD-10-CM | POA: Diagnosis not present

## 2023-03-23 DIAGNOSIS — I2510 Atherosclerotic heart disease of native coronary artery without angina pectoris: Secondary | ICD-10-CM | POA: Diagnosis not present

## 2023-03-23 LAB — LIPID PANEL
Chol/HDL Ratio: 2.6 ratio (ref 0.0–5.0)
Cholesterol, Total: 103 mg/dL (ref 100–199)
HDL: 40 mg/dL (ref >39)
LDL Chol Calc (NIH): 49 mg/dL (ref 0–99)
Triglycerides: 61 mg/dL (ref 0–149)
VLDL Cholesterol Cal: 14 mg/dL (ref 5–40)

## 2023-03-30 ENCOUNTER — Telehealth (HOSPITAL_COMMUNITY): Payer: Self-pay | Admitting: *Deleted

## 2023-03-30 NOTE — Telephone Encounter (Signed)
Patient given detailed instructions per Myocardial Perfusion Study Information Sheet for the test on 04/04/2023 at 7:15. Patient notified to arrive 15 minutes early and that it is imperative to arrive on time for appointment to keep from having the test rescheduled.  If you need to cancel or reschedule your appointment, please call the office within 24 hours of your appointment. . Patient verbalized understanding.[name] [name]

## 2023-04-01 ENCOUNTER — Telehealth: Payer: Self-pay | Admitting: Cardiovascular Disease

## 2023-04-01 NOTE — Telephone Encounter (Signed)
Patient calling to speak with the nurse. He states that he pull his hamstring and wanted to know if he should cancel his appt on Monday. Please advise

## 2023-04-04 ENCOUNTER — Ambulatory Visit (HOSPITAL_COMMUNITY): Payer: Medicare HMO | Attending: Cardiovascular Disease

## 2023-04-04 DIAGNOSIS — I2510 Atherosclerotic heart disease of native coronary artery without angina pectoris: Secondary | ICD-10-CM | POA: Insufficient documentation

## 2023-04-04 DIAGNOSIS — R079 Chest pain, unspecified: Secondary | ICD-10-CM | POA: Insufficient documentation

## 2023-04-04 LAB — MYOCARDIAL PERFUSION IMAGING
LV dias vol: 102 mL (ref 62–150)
LV sys vol: 53 mL
Nuc Stress EF: 48 %
Peak HR: 75 {beats}/min
Rest HR: 63 {beats}/min
Rest Nuclear Isotope Dose: 9.9 mCi
SDS: 11
SRS: 7
SSS: 19
ST Depression (mm): 0 mm
Stress Nuclear Isotope Dose: 32.8 mCi
TID: 1.09

## 2023-04-04 MED ORDER — REGADENOSON 0.4 MG/5ML IV SOLN
0.4000 mg | Freq: Once | INTRAVENOUS | Status: AC
Start: 2023-04-04 — End: 2023-04-04
  Administered 2023-04-04: 0.4 mg via INTRAVENOUS

## 2023-04-04 MED ORDER — TECHNETIUM TC 99M TETROFOSMIN IV KIT
32.8000 | PACK | Freq: Once | INTRAVENOUS | Status: AC | PRN
  Administered 2023-04-04: 32.8 via INTRAVENOUS

## 2023-04-04 MED ORDER — TECHNETIUM TC 99M TETROFOSMIN IV KIT
9.9000 | PACK | Freq: Once | INTRAVENOUS | Status: AC | PRN
  Administered 2023-04-04: 9.9 via INTRAVENOUS

## 2023-04-06 ENCOUNTER — Telehealth: Payer: Self-pay

## 2023-04-06 ENCOUNTER — Telehealth: Payer: Self-pay | Admitting: Cardiovascular Disease

## 2023-04-06 NOTE — Telephone Encounter (Signed)
Pt stated he was contacted Monday night on MyChart regarding his results from Myocardial Perfusion on 7/1 and was told someone would call him yesterday to discuss the plan for moving forward but he hasn't heard anything. Pt would like a callback. Please advise

## 2023-04-06 NOTE — Telephone Encounter (Signed)
Called and spoke with patient who understands and agrees to plan. LHC scheduled for Monday, July 8th at 12pm with Thukkani. Instructions sent via MyChart.  Labs and EKG both updated during visit on 03/17/23.

## 2023-04-06 NOTE — Telephone Encounter (Signed)
-----   Message from Vesta Mixer, MD sent at 04/04/2023  7:52 PM EDT ----- Recent [name] [name] shows a large inferior fixed defect ( scar) in the inferior wall  He has been having episodes of CP - similar to his previous UAP Lets schedule him for cardiac cath this week if possible I will call tomorrow and discuss with him

## 2023-04-08 NOTE — Telephone Encounter (Signed)
Completed in separate encounter

## 2023-04-11 ENCOUNTER — Other Ambulatory Visit (HOSPITAL_COMMUNITY): Payer: Self-pay

## 2023-04-11 ENCOUNTER — Ambulatory Visit (HOSPITAL_COMMUNITY)
Admission: AD | Admit: 2023-04-11 | Discharge: 2023-04-12 | Disposition: A | Discharge status: Home / Self Care | Payer: Medicare HMO | Attending: Internal Medicine | Admitting: Internal Medicine

## 2023-04-11 ENCOUNTER — Ambulatory Visit (HOSPITAL_COMMUNITY)
Admission: AD | Disposition: A | Discharge status: Home / Self Care | Payer: Self-pay | Source: Home / Self Care | Attending: Internal Medicine

## 2023-04-11 ENCOUNTER — Ambulatory Visit (HOSPITAL_COMMUNITY): Payer: Medicare HMO

## 2023-04-11 ENCOUNTER — Other Ambulatory Visit: Payer: Self-pay

## 2023-04-11 DIAGNOSIS — T79A19A Traumatic compartment syndrome of unspecified upper extremity, initial encounter: Secondary | ICD-10-CM

## 2023-04-11 DIAGNOSIS — Z955 Presence of coronary angioplasty implant and graft: Secondary | ICD-10-CM | POA: Insufficient documentation

## 2023-04-11 DIAGNOSIS — I10 Essential (primary) hypertension: Secondary | ICD-10-CM | POA: Diagnosis not present

## 2023-04-11 DIAGNOSIS — Z79899 Other long term (current) drug therapy: Secondary | ICD-10-CM | POA: Insufficient documentation

## 2023-04-11 DIAGNOSIS — Z9861 Coronary angioplasty status: Principal | ICD-10-CM

## 2023-04-11 DIAGNOSIS — M96841 Postprocedural hematoma of a musculoskeletal structure following other procedure: Secondary | ICD-10-CM | POA: Diagnosis not present

## 2023-04-11 DIAGNOSIS — I25119 Atherosclerotic heart disease of native coronary artery with unspecified angina pectoris: Secondary | ICD-10-CM | POA: Diagnosis not present

## 2023-04-11 DIAGNOSIS — R2231 Localized swelling, mass and lump, right upper limb: Secondary | ICD-10-CM | POA: Diagnosis not present

## 2023-04-11 DIAGNOSIS — I2510 Atherosclerotic heart disease of native coronary artery without angina pectoris: Secondary | ICD-10-CM | POA: Diagnosis present

## 2023-04-11 DIAGNOSIS — M7989 Other specified soft tissue disorders: Secondary | ICD-10-CM | POA: Diagnosis not present

## 2023-04-11 DIAGNOSIS — E785 Hyperlipidemia, unspecified: Secondary | ICD-10-CM | POA: Insufficient documentation

## 2023-04-11 DIAGNOSIS — S5011XA Contusion of right forearm, initial encounter: Secondary | ICD-10-CM | POA: Insufficient documentation

## 2023-04-11 DIAGNOSIS — I2582 Chronic total occlusion of coronary artery: Secondary | ICD-10-CM | POA: Insufficient documentation

## 2023-04-11 DIAGNOSIS — Z006 Encounter for examination for normal comparison and control in clinical research program: Secondary | ICD-10-CM

## 2023-04-11 HISTORY — PX: CORONARY BALLOON ANGIOPLASTY: CATH118233

## 2023-04-11 HISTORY — PX: CORONARY PRESSURE/FFR STUDY: CATH118243

## 2023-04-11 HISTORY — PX: LEFT HEART CATH AND CORONARY ANGIOGRAPHY: CATH118249

## 2023-04-11 LAB — POCT ACTIVATED CLOTTING TIME
Activated Clotting Time: 195 seconds
Activated Clotting Time: 232 seconds
Activated Clotting Time: 232 seconds

## 2023-04-11 SURGERY — LEFT HEART CATH AND CORONARY ANGIOGRAPHY
Anesthesia: LOCAL

## 2023-04-11 MED ORDER — HEPARIN SODIUM (PORCINE) 1000 UNIT/ML IJ SOLN
INTRAMUSCULAR | Status: AC
  Filled 2023-04-11: qty 10

## 2023-04-11 MED ORDER — CLOPIDOGREL BISULFATE 75 MG PO TABS
75.0000 mg | ORAL_TABLET | Freq: Every day | ORAL | 12 refills | Status: DC
  Filled 2023-04-11 (×2): qty 30, 30d supply, fill #0

## 2023-04-11 MED ORDER — HEPARIN SODIUM (PORCINE) 1000 UNIT/ML IJ SOLN
INTRAMUSCULAR | Status: DC | PRN
  Administered 2023-04-11: 3000 [IU] via INTRAVENOUS
  Administered 2023-04-11: 2000 [IU] via INTRAVENOUS
  Administered 2023-04-11: 3000 [IU] via INTRAVENOUS
  Administered 2023-04-11: 2000 [IU] via INTRAVENOUS

## 2023-04-11 MED ORDER — MIDAZOLAM HCL 2 MG/2ML IJ SOLN
INTRAMUSCULAR | Status: AC
  Filled 2023-04-11: qty 2

## 2023-04-11 MED ORDER — PANTOPRAZOLE SODIUM 40 MG PO TBEC
40.0000 mg | DELAYED_RELEASE_TABLET | Freq: Every day | ORAL | 1 refills | Status: DC
  Filled 2023-04-11: qty 30, 30d supply, fill #0

## 2023-04-11 MED ORDER — LIDOCAINE HCL (PF) 1 % IJ SOLN
INTRAMUSCULAR | Status: DC | PRN
  Administered 2023-04-11 (×2): 5 mL

## 2023-04-11 MED ORDER — CLOPIDOGREL BISULFATE 75 MG PO TABS: 75.0000 mg | ORAL_TABLET | Freq: Every day | ORAL | 12 refills | Status: DC

## 2023-04-11 MED ORDER — VERAPAMIL HCL 2.5 MG/ML IV SOLN
INTRAVENOUS | Status: AC
  Filled 2023-04-11: qty 2

## 2023-04-11 MED ORDER — MIDAZOLAM HCL 2 MG/2ML IJ SOLN
INTRAMUSCULAR | Status: DC | PRN
  Administered 2023-04-11 (×2): 1 mg via INTRAVENOUS

## 2023-04-11 MED ORDER — ASPIRIN 81 MG PO CHEW: 81.0000 mg | CHEWABLE_TABLET | ORAL | Status: DC

## 2023-04-11 MED ORDER — FENTANYL CITRATE (PF) 100 MCG/2ML IJ SOLN
INTRAMUSCULAR | Status: DC | PRN
  Administered 2023-04-11 (×2): 25 ug via INTRAVENOUS

## 2023-04-11 MED ORDER — CLOPIDOGREL BISULFATE 75 MG PO TABS
75.0000 mg | ORAL_TABLET | Freq: Every day | ORAL | Status: DC
  Administered 2023-04-12: 75 mg via ORAL
  Filled 2023-04-11: qty 1

## 2023-04-11 MED ORDER — HEPARIN (PORCINE) IN NACL 1000-0.9 UT/500ML-% IV SOLN
INTRAVENOUS | Status: DC | PRN
  Administered 2023-04-11 (×2): 500 mL

## 2023-04-11 MED ORDER — ASPIRIN 81 MG PO CHEW
81.0000 mg | CHEWABLE_TABLET | Freq: Every day | ORAL | Status: DC
  Administered 2023-04-12: 81 mg via ORAL
  Filled 2023-04-11: qty 1

## 2023-04-11 MED ORDER — SODIUM CHLORIDE 0.9% FLUSH
3.0000 mL | Freq: Two times a day (BID) | INTRAVENOUS | Status: DC
  Administered 2023-04-11 – 2023-04-12 (×2): 3 mL via INTRAVENOUS

## 2023-04-11 MED ORDER — SODIUM CHLORIDE 0.9 % WEIGHT BASED INFUSION: 1.0000 mL/kg/h | INTRAVENOUS | Status: DC

## 2023-04-11 MED ORDER — ASPIRIN 81 MG PO TBEC
81.0000 mg | DELAYED_RELEASE_TABLET | Freq: Every day | ORAL | 0 refills | Status: DC
Start: 2023-04-11 — End: 2023-04-11

## 2023-04-11 MED ORDER — SODIUM CHLORIDE 0.9 % IV SOLN: INTRAVENOUS | Status: AC

## 2023-04-11 MED ORDER — ACETAMINOPHEN 325 MG PO TABS
650.0000 mg | ORAL_TABLET | ORAL | Status: DC | PRN
  Administered 2023-04-11 – 2023-04-12 (×2): 650 mg via ORAL
  Filled 2023-04-11 (×2): qty 2

## 2023-04-11 MED ORDER — SODIUM CHLORIDE 0.9 % WEIGHT BASED INFUSION
3.0000 mL/kg/h | INTRAVENOUS | Status: DC
  Administered 2023-04-11: 3 mL/kg/h via INTRAVENOUS

## 2023-04-11 MED ORDER — ASPIRIN 81 MG PO TBEC
81.0000 mg | DELAYED_RELEASE_TABLET | Freq: Every day | ORAL | 0 refills | Status: DC
  Filled 2023-04-11: qty 30, 30d supply, fill #0

## 2023-04-11 MED ORDER — TRAMADOL HCL 50 MG PO TABS
50.0000 mg | ORAL_TABLET | Freq: Once | ORAL | Status: AC
  Administered 2023-04-11: 50 mg via ORAL
  Filled 2023-04-11: qty 1

## 2023-04-11 MED ORDER — SODIUM CHLORIDE 0.9% FLUSH: 3.0000 mL | INTRAVENOUS | Status: DC | PRN

## 2023-04-11 MED ORDER — FENTANYL CITRATE (PF) 100 MCG/2ML IJ SOLN
INTRAMUSCULAR | Status: AC
  Filled 2023-04-11: qty 2

## 2023-04-11 MED ORDER — CLOPIDOGREL BISULFATE 75 MG PO TABS
300.0000 mg | ORAL_TABLET | Freq: Once | ORAL | Status: AC
  Administered 2023-04-11: 300 mg via ORAL
  Filled 2023-04-11: qty 4

## 2023-04-11 MED ORDER — LABETALOL HCL 5 MG/ML IV SOLN: 10.0000 mg | INTRAVENOUS | Status: AC | PRN

## 2023-04-11 MED ORDER — ONDANSETRON HCL 4 MG/2ML IJ SOLN: 4.0000 mg | Freq: Four times a day (QID) | INTRAMUSCULAR | Status: DC | PRN

## 2023-04-11 MED ORDER — HYDRALAZINE HCL 20 MG/ML IJ SOLN: 10.0000 mg | INTRAMUSCULAR | Status: AC | PRN

## 2023-04-11 MED ORDER — SODIUM CHLORIDE 0.9 % IV SOLN: 250.0000 mL | INTRAVENOUS | Status: DC | PRN

## 2023-04-11 MED ORDER — LIDOCAINE HCL (PF) 1 % IJ SOLN
INTRAMUSCULAR | Status: AC
  Filled 2023-04-11: qty 30

## 2023-04-11 MED ORDER — IOHEXOL 350 MG/ML SOLN
INTRAVENOUS | Status: DC | PRN
  Administered 2023-04-11: 110 mL

## 2023-04-11 SURGICAL SUPPLY — 26 items
BALLN EMERGE MR 2.5X12 (BALLOONS) ×2
BALLN WOLVERINE 3.00X10 (BALLOONS) ×4
BALLN WOLVERINE 3.00X6 (BALLOONS) ×2
BALLOON EMERGE MR 2.5X12 (BALLOONS) IMPLANT
BALLOON WOLVERINE 3.00X10 (BALLOONS) IMPLANT
BALLOON WOLVERINE 3.00X6 (BALLOONS) IMPLANT
CATH GUIDELINER COAST (CATHETERS) IMPLANT
CATH INFINITI 5FR MULTPACK ANG (CATHETERS) IMPLANT
CATH INFINITI AMBI 6FR TG (CATHETERS) IMPLANT
CATH LAUNCHER 6FR EBU3.5 (CATHETERS) IMPLANT
CLOSURE PERCLOSE PROSTYLE (VASCULAR PRODUCTS) IMPLANT
DEVICE RAD COMP TR BAND LRG (VASCULAR PRODUCTS) IMPLANT
GLIDESHEATH SLEND SS 6F .021 (SHEATH) IMPLANT
GUIDEWIRE PRESSURE X 175 (WIRE) IMPLANT
KIT ENCORE 26 ADVANTAGE (KITS) IMPLANT
KIT HEART LEFT (KITS) ×2 IMPLANT
PACK CARDIAC CATHETERIZATION (CUSTOM PROCEDURE TRAY) ×2 IMPLANT
SHEATH PINNACLE 5F 10CM (SHEATH) IMPLANT
SHEATH PINNACLE 6F 10CM (SHEATH) IMPLANT
SHEATH PROBE COVER 6X72 (BAG) IMPLANT
TRANSDUCER W/STOPCOCK (MISCELLANEOUS) ×2 IMPLANT
TUBING CIL FLEX 10 FLL-RA (TUBING) ×2 IMPLANT
WIRE ASAHI PROWATER 180CM (WIRE) IMPLANT
WIRE EMERALD 3MM-J .035X260CM (WIRE) IMPLANT
WIRE MICRO SET SILHO 5FR 7 (SHEATH) IMPLANT
WIRE MICROINTRODUCER 60CM (WIRE) IMPLANT

## 2023-04-11 NOTE — Interval H&P Note (Signed)
History and Physical Interval Note:  04/11/2023 9:52 AM  Suezanne Jacquet II  has presented today for surgery, with the diagnosis of abnormal stress test.  The various methods of treatment have been discussed with the patient and family. After consideration of risks, benefits and other options for treatment, the patient has consented to  Procedure(s): LEFT HEART CATH AND CORONARY ANGIOGRAPHY (N/A) as a surgical intervention.  The patient's history has been reviewed, patient examined, no change in status, stable for surgery.  I have reviewed the patient's chart and labs.  Questions were answered to the patient's satisfaction.    Cath Lab Visit (complete for each Cath Lab visit)  Clinical Evaluation Leading to the Procedure:   ACS: No.  Non-ACS:    Anginal Classification: CCS II  Anti-ischemic medical therapy: Maximal Therapy (2 or more classes of medications)  Non-Invasive Test Results: No non-invasive testing performed  Prior CABG: No previous CABG        Orbie Pyo

## 2023-04-11 NOTE — Progress Notes (Signed)
Upper extremity arterial duplex right study completed.   Please see CV Proc for preliminary results.   Trinton Prewitt, RDMS, RVT  

## 2023-04-11 NOTE — Progress Notes (Addendum)
Received order for post pci however pt unable to receive pci. Will not see. CRPII n/a unless pt has new stable angina dx. MD could order from office. Ethelda Chick BS, ACSM-CEP 04/11/2023 1:50 PM  Notified from Laverda Page that the artery was in fact PTCA'd. Discussed with pt restrictions, walking, NTG and CRPII. Left heart healthy diet. Will refer to [place] CRPII. Encouraged pt to begin with building up his walking.  [id number] Ethelda Chick BS, ACSM-CEP 04/11/2023 3:39 PM

## 2023-04-11 NOTE — Consult Note (Signed)
Orthopedic Hand Surgery Consultation:  Reason for Consult: Right forearm swelling after cath, right radial access Referring Physician: Dr. Lynnette Caffey   HPI: [name] [name] is a(an) 82 y.o. male    Who underwent a cardiac catheterization via right radial artery access.  He is in the PACU and had swelling to his right forearm.  He was seen by vascular surgery as well as myself and there is no sign of compartment syndrome to the right forearm.  He has no numbness or tingling and minimal pain in the area.  He has a compression bandage over the right radial access site with no bleeding.  Physical exam right upper extremity shows no pain full range of motion of the elbow wrist and digits.  No pain with passive stretch of each individual digit.  His hand is warm well-perfused.  Brisk cap refill to tips of the digits.  No numbness or tingling in the digits with intact sensation to the median, radial, ulnar nerve distributions.  The compartments of the right forearm and and mobile wad are soft and compressible.  Mild swelling to the right forearm.  Overall the patient is comfortable in the PACU bed with minimal pain he has some mild swelling within the right forearm.  Plan to observe this overnight closely.  If any signs of worsening symptoms they will call me for reassessment.  Plan to elevate at the level of the heart.  Continue to work on finger range of motion.   [name] Dad, MD Orthopaedic Hand Surgeon [facility] Office number: [phone] [address], [address] [place], [place] [address]    Past Medical History:  Diagnosis Date   Anxiety    Coronary artery disease    status post PTCA and stenting of his left circumflex artery and right coronary artery   GERD (gastroesophageal reflux disease)    H/O heart artery stent    Hyperlipidemia    Hypertension    Pt denies   Reflux    omeprazole 40mg     Past Surgical History:  Procedure Laterality Date   Benign Cyst     on  back   CARDIOVASCULAR STRESS TEST  05/09/2007   EF 64%   carpal tunnel left     CATARACT EXTRACTION     bilateral- april 15th with repeat 3 weeks later   CHOLECYSTECTOMY N/A 01/12/2021   Procedure: LAPAROSCOPIC CHOLECYSTECTOMY WITH INTRAOPERATIVE CHOLANGIOGRAM;  Surgeon: Darnell Level, MD;  Location: [facility];  Service: General;  Laterality: N/A;  90MIN/RM2   CORONARY ANGIOPLASTY WITH STENT PLACEMENT     CYSTECTOMY     removed from finger   cystoscopy     LIPOMA EXCISION     Back   right knee surgery     arthroscopyc 02/2010    Family History  Problem Relation Age of Onset   Bladder Cancer Mother        passed age 69.    Heart attack Father        74 passed from this   Alcohol abuse Father     Social History:  reports that he has never smoked. He has never used smokeless tobacco. He reports that he does not drink alcohol and does not use drugs.  Allergies:  Allergies  Allergen Reactions   Hydrocodone Nausea And Vomiting   Simvastatin Other (See Comments)    Leg pain     Medications: reviewed, no changes to patient's home medications  Results for orders placed or performed during the hospital encounter  of 04/11/23 (from the past 48 hour(s))  POCT Activated clotting time     Status: None   Collection Time: 04/11/23 12:05 PM  Result Value Ref Range   Activated Clotting Time 195 seconds    Comment: Reference range 74-137 seconds for patients not on anticoagulant therapy.  POCT Activated clotting time     Status: None   Collection Time: 04/11/23 12:21 PM  Result Value Ref Range   Activated Clotting Time 232 seconds    Comment: Reference range 74-137 seconds for patients not on anticoagulant therapy.  POCT Activated clotting time     Status: None   Collection Time: 04/11/23 12:37 PM  Result Value Ref Range   Activated Clotting Time 232 seconds    Comment: Reference range 74-137 seconds for patients not on anticoagulant therapy.    VAS [place] UPPER EXTREMITY ARTERIAL  DUPLEX  Result Date: 04/11/2023  UPPER EXTREMITY DUPLEX STUDY Patient Name:  [name] [name]  Date of Exam:   04/11/2023 Medical Rec #: [id number]             Accession #:    [id number] Date of Birth: 1941/03/07             Patient Gender: M Patient Age:   62 years Exam Location[facility]  [facility] Procedure:      VAS [place] UPPER EXTREMITY ARTERIAL DUPLEX Referring Phys: Ivin Booty ROBINS --------------------------------------------------------------------------------  Indications: Pain/swelling s/p cath, concern for compartment syndrome/bleed. History:     Patient has a history of catheterization via right radial artery.  Comparison Study: No prior studies. Performing Technologist: Jean Rosenthal RDMS, RVT  Examination Guidelines: A complete evaluation includes B-mode imaging, spectral Doppler, color Doppler, and power Doppler as needed of all accessible portions of each vessel. Bilateral testing is considered an integral part of a complete examination. Limited examinations for reoccurring indications may be performed as noted.  Right Doppler Findings: +---------------+----------+--------+--------+-------------+ Site           PSV (cm/s)WaveformStenosisComments      +---------------+----------+--------+--------+-------------+ Subclavian Dist          biphasic                      +---------------+----------+--------+--------+-------------+ Axillary       100       biphasic                      +---------------+----------+--------+--------+-------------+ Brachial Prox  139       biphasic                      +---------------+----------+--------+--------+-------------+ Brachial Mid   133       biphasic                      +---------------+----------+--------+--------+-------------+ Brachial Dist  119       biphasic                      +---------------+----------+--------+--------+-------------+ Radial Prox    69        biphasic                       +---------------+----------+--------+--------+-------------+ Radial Mid     76        biphasic                      +---------------+----------+--------+--------+-------------+ Radial Dist    48  biphasic                      +---------------+----------+--------+--------+-------------+ Ulnar Prox     301       biphasic                      +---------------+----------+--------+--------+-------------+ Ulnar Mid      218       biphasic                      +---------------+----------+--------+--------+-------------+ Ulnar Dist     78        biphasic                      +---------------+----------+--------+--------+-------------+ Palmar Arch    18        biphasic        Flow reversal +---------------+----------+--------+--------+-------------+   A mixed echogenic structure is visualized at the mid forearm with ultrasound characteristics of a hematoma. 2.8 x 1.7 cm in short axis.    Preliminary    CARDIAC CATHETERIZATION  Result Date: 04/11/2023   Prox RCA lesion is 100% stenosed.   Prox Cx to Mid Cx lesion is 20% stenosed.   Prox Cx lesion is 80% stenosed.   Dist LAD lesion is 99% stenosed.   Mid LM lesion is 60% stenosed.   Post intervention, there is a 80% residual stenosis. 1.  Very calcified right radial artery unable to accommodate sheath. 2.  Chronic total occlusion of proximal right coronary artery collateralized by the left circumflex. 3.  Moderate left main disease with an associated RFR value of 0.91 with moderate diffuse distal dilute disease of the LAD; the distal LAD should be treated medically and does not appear to be a good target for surgical revascularization. 4.  Focal 80% proximal left circumflex lesion with patent mid left circumflex stents.  The associated RFR was 0.71.  Despite multiple wires and a guide liner, the angulation from the left main/LAD into the circumflex could not be negotiated.  Therefore PCI was deferred and optimal medical therapy  should be pursued. 5.  LVEDP of 9 mmHg. Recommendation: Optimal medical therapy.    ROS: 14 point review of systems negative except per HPI

## 2023-04-11 NOTE — Progress Notes (Signed)
C/o 8/10 arm pain, pa cardiology came to assess and inform Dr Lynnette Caffey. TR band was removed to see if it would reduce the discomfort, when pt arrived post procedure he stated his forearm felt tight, he also stated he bowls a lot and has stronger forearms on the right. No change in pulses, hand is not cyanotic. Arm remains elevated.

## 2023-04-11 NOTE — Consult Note (Signed)
Hospital Consult    Reason for Consult: Concern for right upper extremity compartment syndrome Requesting Physician: Dr. Lynnette Caffey MRN #:  [id number]  History of Present Illness: This is a 82 y.o. male with CAD now status post cardiac cath, initially attempted from the right radial artery, however met resistance, and therefore came in the groin.  Postoperatively, the patient was given Plavix, and swelling was noted in the right upper extremity with numbness in the fingertips.  Vascular surgery was called for further recommendations.  On exam, Aleksa was resting comfortably, daughter at bedside. He noted no sensory changes in the right hand.  Noted pain in the forearm which was limiting his flexion and extension.  Past Medical History:  Diagnosis Date   Anxiety    Coronary artery disease    status post PTCA and stenting of his left circumflex artery and right coronary artery   GERD (gastroesophageal reflux disease)    H/O heart artery stent    Hyperlipidemia    Hypertension    Pt denies   Reflux    omeprazole 40mg     Past Surgical History:  Procedure Laterality Date   Benign Cyst     on back   CARDIOVASCULAR STRESS TEST  05/09/2007   EF 64%   carpal tunnel left     CATARACT EXTRACTION     bilateral- april 15th with repeat 3 weeks later   CHOLECYSTECTOMY N/A 01/12/2021   Procedure: LAPAROSCOPIC CHOLECYSTECTOMY WITH INTRAOPERATIVE CHOLANGIOGRAM;  Surgeon: Darnell Level, MD;  Location: [facility];  Service: General;  Laterality: N/A;  90MIN/RM2   CORONARY ANGIOPLASTY WITH STENT PLACEMENT     CYSTECTOMY     removed from finger   cystoscopy     LIPOMA EXCISION     Back   right knee surgery     arthroscopyc 02/2010    Allergies  Allergen Reactions   Hydrocodone Nausea And Vomiting   Simvastatin Other (See Comments)    Leg pain     Prior to Admission medications   Medication Sig Start Date End Date Taking? Authorizing Provider  aspirin 81 MG tablet Take 1 tablet (81 mg  total) by mouth daily. 01/16/14  Yes Nahser, Deloris Ping, MD  atorvastatin (LIPITOR) 40 MG tablet Take 1 tablet (40 mg total) by mouth daily. 06/09/22  Yes Nahser, Deloris Ping, MD  Calcium Carbonate-Vitamin D (CALCIUM + D PO) Take 1 tablet by mouth daily.   Yes [provider]  glucosamine-chondroitin 500-400 MG tablet Take 1 tablet by mouth daily.   Yes [provider]  ibuprofen (ADVIL) 200 MG tablet Take 400 mg by mouth every 6 (six) hours as needed for moderate pain or mild pain.   Yes [provider]  Melatonin 10 MG TABS Take 10 mg by mouth at bedtime.   Yes [provider]  metoprolol tartrate (LOPRESSOR) 25 MG tablet Take 1 tablet (25 mg total) by mouth 2 (two) times daily. 06/09/22  Yes Nahser, Deloris Ping, MD  Multiple Vitamin (MULTIVITAMIN PO) Take 1 tablet by mouth daily.   Yes [provider]  Omega-3 Fatty Acids (FISH OIL) 1200 MG CAPS Take 1,200 mg by mouth daily.   Yes [provider]  omeprazole (PRILOSEC) 40 MG capsule Take 1 capsule (40 mg total) by mouth daily. 04/13/22  Yes Shelva Majestic, MD  pantoprazole (PROTONIX) 40 MG tablet Take 1 tablet (40 mg total) by mouth daily. 04/11/23 04/10/24 Yes Orbie Pyo, MD  sildenafil (VIAGRA) 100 MG tablet Take 1  tablet (100 mg total) by mouth daily as needed for erectile dysfunction. 06/09/22  Yes Nahser, Deloris Ping, MD  tamsulosin (FLOMAX) 0.4 MG CAPS capsule Take 1 capsule (0.4 mg total) by mouth daily. 04/13/22  Yes Shelva Majestic, MD  traZODone (DESYREL) 50 MG tablet TAKE 1/2 TO 1 TABLET AT BEDTIME AS NEEDED FOR SLEEP Patient taking differently: Take 50 mg by mouth at bedtime. 04/13/22  Yes Shelva Majestic, MD  trolamine salicylate (BLUE-EMU HEMP) 10 % cream Apply 1 Application topically daily as needed for muscle pain.   Yes [provider]  aspirin EC 81 MG tablet Take 1 tablet (81 mg total) by mouth daily. Swallow whole. 04/11/23   Orbie Pyo, MD  clopidogrel (PLAVIX) 75 MG  tablet Take 1 tablet (75 mg total) by mouth daily. 04/11/23   Orbie Pyo, MD  fluticasone (FLONASE) 50 MCG/ACT nasal spray Place 2 sprays into both nostrils daily. Patient taking differently: Place 2 sprays into both nostrils daily as needed for allergies. 10/30/19   Shelva Majestic, MD  nitroGLYCERIN (NITROSTAT) 0.4 MG SL tablet Place 1 tablet (0.4 mg total) under the tongue every 5 (five) minutes as needed for chest pain. 03/22/23   Nahser, Deloris Ping, MD    Social History   Socioeconomic History   Marital status: Widowed    Spouse name: Not on file   Number of children: Not on file   Years of education: Not on file   Highest education level: Not on file  Occupational History   Not on file  Tobacco Use   Smoking status: Never   Smokeless tobacco: Never  Vaping Use   Vaping Use: Never used  Substance and Sexual Activity   Alcohol use: No   Drug use: No   Sexual activity: Not on file  Other Topics Concern   Not on file  Social History Narrative   Widowed march 2023.  3 grown children, 3 grown stepkids. 9 grandkids. [place] 3 in 2024.    Lives in [place] now      Retired from [employer]. Over 46 years.    Social Determinants of Health   Financial Resource Strain: Low Risk  (03/07/2023)   Overall Financial Resource Strain (CARDIA)    Difficulty of Paying Living Expenses: Not hard at all  Food Insecurity: No Food Insecurity (03/07/2023)   Hunger Vital Sign    Worried About Running Out of Food in the Last Year: Never true    Ran Out of Food in the Last Year: Never true  Transportation Needs: No Transportation Needs (03/07/2023)   PRAPARE - Administrator, Civil Service (Medical): No    Lack of Transportation (Non-Medical): No  Physical Activity: Sufficiently Active (03/07/2023)   Exercise Vital Sign    Days of Exercise per Week: 3 days    Minutes of Exercise per Session: 90 min  Stress: No Stress Concern Present (03/07/2023)   [employer] of Occupational  Health - Occupational Stress Questionnaire    Feeling of Stress : Only a little  Social Connections: Moderately Isolated (03/07/2023)   Social Connection and Isolation Panel [NHANES]    Frequency of Communication with Friends and Family: More than three times a week    Frequency of Social Gatherings with Friends and Family: More than three times a week    Attends Religious Services: Never    Database administrator or Organizations: Yes    Attends Engineer, structural: More than 4  times per year    Marital Status: Widowed  Intimate Partner Violence: Not At Risk (03/08/2023)   Humiliation, Afraid, Rape, and Kick questionnaire    Fear of Current or Ex-Partner: No    Emotionally Abused: No    Physically Abused: No    Sexually Abused: No   Family History  Problem Relation Age of Onset   Bladder Cancer Mother        passed age 33.    Heart attack Father        85 passed from this   Alcohol abuse Father     ROS: Otherwise negative unless mentioned in HPI  Physical Examination  Vitals:   04/11/23 1600 04/11/23 1700  BP: 120/65 (!) 119/97  Pulse: 69 72  Resp: 16 19  Temp:    SpO2: 98% 98%   Body mass index is 25.09 kg/m.  General:  WDWN in NAD Gait: Not observed HENT: WNL, normocephalic Pulmonary: normal non-labored breathing, without Rales, rhonchi,  wheezing Cardiac: regular Abdomen: soft, NT/ND, no masses Skin: with rashes Vascular Exam/Pulses: 2+ Ulnar right Extremities: without ischemic changes, without Gangrene , without cellulitis; without open wounds;  Musculoskeletal: no muscle wasting or atrophy  Neurologic: A&O X 3;  No focal weakness or paresthesias are detected; speech is fluent/normal Sensory exam symmetric, motor exam with some weakness in the right arm-range of motion appeared limited due to pain. Psychiatric:  The pt has Normal affect. Lymph:  Unremarkable  CBC    Component Value Date/Time   WBC 6.4 03/17/2023 1220   RBC 4.85 03/17/2023 1220    HGB 15.0 03/17/2023 1220   HCT 44.2 03/17/2023 1220   PLT 204.0 03/17/2023 1220   MCV 91.2 03/17/2023 1220   MCH 30.3 01/09/2021 0942   MCHC 33.8 03/17/2023 1220   RDW 13.2 03/17/2023 1220   LYMPHSABS 1.7 03/17/2023 1220   MONOABS 0.5 03/17/2023 1220   EOSABS 0.1 03/17/2023 1220   BASOSABS 0.0 03/17/2023 1220    BMET    Component Value Date/Time   NA 136 03/17/2023 1220   NA 138 06/11/2020 0948   K 4.5 03/17/2023 1220   CL 100 03/17/2023 1220   CO2 30 03/17/2023 1220   GLUCOSE 94 03/17/2023 1220   BUN 18 03/17/2023 1220   BUN 15 06/11/2020 0948   CREATININE 0.78 03/17/2023 1220   CREATININE 0.84 01/21/2016 0914   CALCIUM 9.5 03/17/2023 1220   GFRNONAA >60 01/09/2021 0942   GFRAA 103 06/11/2020 0948    COAGS: No results found for: "INR", "PROTIME"  Imaging demonstrated right for hematoma  ASSESSMENT/PLAN: This is a 82 y.o. male status post coronary cath from attempted right radial access resulting in forearm hematoma. Hematoma was appreciated on ultrasound.  No extravasation noted, no pseudoaneurysm. No concern for compartment syndrome at this time -patient with no sensory deficit.  Some impaired flexion and extension of the fingers due to pain in the forearm.  No plan for vascular surgery intervention at this time. Recommend monitoring overnight with serial exams.  Patient and daughter aware that should new onset paresthesias occur, would discuss hematoma evacuation.    Fara Olden MD [facility] [phone] 04/11/2023  5:31 PM

## 2023-04-11 NOTE — Progress Notes (Addendum)
Called by short stay nurse, reports patient had hardened right forearm since cath. Patient examined at bedside, alert and oriented x3, c/o right forearm pain, tightness, and right arm and hand tingling sensation. TR band was removed, no external bleeding, radial pulse +, right hands cool to touch, no motor movement limitation. After TR band removed for 10 minutes, he no longer having right arm tingling, but right forearm pain and tightness remains. Discussed with Dr Lynnette Caffey, recommend vascular consult. Consult requested to vascular surgery Dr Theone Murdoch , concern about compartment syndrome, recommend consult to ortho hand surgery, will see the patient shortly. Consult requested to ortho hand surgery and reviewed above case with Dr Yehuda Budd, agreed to evaluate the patient shortly.

## 2023-04-12 ENCOUNTER — Encounter (HOSPITAL_COMMUNITY): Payer: Self-pay | Admitting: Internal Medicine

## 2023-04-12 ENCOUNTER — Other Ambulatory Visit: Payer: Self-pay

## 2023-04-12 DIAGNOSIS — S5011XA Contusion of right forearm, initial encounter: Secondary | ICD-10-CM | POA: Insufficient documentation

## 2023-04-12 DIAGNOSIS — Z79899 Other long term (current) drug therapy: Secondary | ICD-10-CM | POA: Diagnosis not present

## 2023-04-12 DIAGNOSIS — Z955 Presence of coronary angioplasty implant and graft: Secondary | ICD-10-CM | POA: Diagnosis not present

## 2023-04-12 DIAGNOSIS — I25119 Atherosclerotic heart disease of native coronary artery with unspecified angina pectoris: Secondary | ICD-10-CM | POA: Diagnosis not present

## 2023-04-12 DIAGNOSIS — E785 Hyperlipidemia, unspecified: Secondary | ICD-10-CM | POA: Diagnosis not present

## 2023-04-12 DIAGNOSIS — I200 Unstable angina: Secondary | ICD-10-CM | POA: Diagnosis not present

## 2023-04-12 DIAGNOSIS — I2582 Chronic total occlusion of coronary artery: Secondary | ICD-10-CM | POA: Diagnosis not present

## 2023-04-12 DIAGNOSIS — I10 Essential (primary) hypertension: Secondary | ICD-10-CM | POA: Diagnosis not present

## 2023-04-12 DIAGNOSIS — M96841 Postprocedural hematoma of a musculoskeletal structure following other procedure: Secondary | ICD-10-CM | POA: Diagnosis not present

## 2023-04-12 LAB — CBC
HCT: 38.3 % — ABNORMAL LOW (ref 39.0–52.0)
Hemoglobin: 13.1 g/dL (ref 13.0–17.0)
MCH: 31.4 pg (ref 26.0–34.0)
MCHC: 34.2 g/dL (ref 30.0–36.0)
MCV: 91.8 fL (ref 80.0–100.0)
Platelets: 189 10*3/uL (ref 150–400)
RBC: 4.17 MIL/uL — ABNORMAL LOW (ref 4.22–5.81)
RDW: 12.7 % (ref 11.5–15.5)
WBC: 7.4 10*3/uL (ref 4.0–10.5)
nRBC: 0 % (ref 0.0–0.2)

## 2023-04-12 LAB — BASIC METABOLIC PANEL
Anion gap: 5 (ref 5–15)
BUN: 16 mg/dL (ref 8–23)
CO2: 27 mmol/L (ref 22–32)
Calcium: 8.4 mg/dL — ABNORMAL LOW (ref 8.9–10.3)
Chloride: 100 mmol/L (ref 98–111)
Creatinine, Ser: 0.82 mg/dL (ref 0.61–1.24)
GFR, Estimated: >60 mL/min (ref >60)
Glucose, Bld: 102 mg/dL — ABNORMAL HIGH (ref 70–99)
Potassium: 3.6 mmol/L (ref 3.5–5.1)
Sodium: 132 mmol/L — ABNORMAL LOW (ref 135–145)

## 2023-04-12 MED FILL — Verapamil HCl IV Soln 2.5 MG/ML: INTRAVENOUS | Qty: 2 | Status: AC

## 2023-04-12 NOTE — Progress Notes (Signed)
  Progress Note    04/12/2023 7:39 AM 1 Day Post-Op  Subjective:  sitting up eating breakfast. Says forearm is feeling a little better. Still some soreness/tightness in forearm with gripping objects   Vitals:   04/11/23 2354 04/12/23 0416  BP: 107/66 114/71  Pulse: 70 75  Resp: 18 16  Temp: 98.3 F (36.8 C) 98.4 F (36.9 C)  SpO2: 98% 98%   Physical Exam: Cardiac:  regular Lungs:  non labored Extremities:  right forearm soft. 2+ radial and brachial pulses palpable. Hand warm with 4/5 grip strength due to soreness Neurologic: alert and oriented   CBC    Component Value Date/Time   WBC 7.4 04/12/2023 0150   RBC 4.17 (L) 04/12/2023 0150   HGB 13.1 04/12/2023 0150   HCT 38.3 (L) 04/12/2023 0150   PLT 189 04/12/2023 0150   MCV 91.8 04/12/2023 0150   MCH 31.4 04/12/2023 0150   MCHC 34.2 04/12/2023 0150   RDW 12.7 04/12/2023 0150   LYMPHSABS 1.7 03/17/2023 1220   MONOABS 0.5 03/17/2023 1220   EOSABS 0.1 03/17/2023 1220   BASOSABS 0.0 03/17/2023 1220    BMET    Component Value Date/Time   NA 132 (L) 04/12/2023 0150   NA 138 06/11/2020 0948   K 3.6 04/12/2023 0150   CL 100 04/12/2023 0150   CO2 27 04/12/2023 0150   GLUCOSE 102 (H) 04/12/2023 0150   BUN 16 04/12/2023 0150   BUN 15 06/11/2020 0948   CREATININE 0.82 04/12/2023 0150   CREATININE 0.84 01/21/2016 0914   CALCIUM 8.4 (L) 04/12/2023 0150   GFRNONAA >60 04/12/2023 0150   GFRAA 103 06/11/2020 0948    INR No results found for: "INR"   Intake/Output Summary (Last 24 hours) at 04/12/2023 0739 Last data filed at 04/11/2023 1259 Gross per 24 hour  Intake --  Output 100 ml  Net -100 ml     Assessment/Plan:  82 y.o. male is s/p cardiac cath with right forearm hematoma  Right forearm compartments soft Right arm well perfused with palpable radial and brachial pulses Will take some time for small forearm hematoma to resolve No concern for compartment syndrome. No need for vascular intervention at this  time  Graceann Congress, [place] [facility] [phone] 04/12/2023 7:39 AM

## 2023-04-12 NOTE — Plan of Care (Signed)

## 2023-04-12 NOTE — Discharge Summary (Addendum)
Discharge Summary    Patient ID: [name] [name] MRN: [id number]; DOB: 11-30-1940  Admit date: 04/11/2023 Discharge date: 04/12/2023  PCP:  Shelva Majestic, MD   [facility] Providers Cardiologist:  [name], MD      Discharge Diagnoses    Principal Problem:   CAD (coronary artery disease) Active Problems:   Hematoma of right forearm   Diagnostic Studies/Procedures    Cath: 04/11/2023    Prox RCA lesion is 100% stenosed.   Prox Cx to Mid Cx lesion is 20% stenosed.   Prox Cx lesion is 80% stenosed.   Dist LAD lesion is 99% stenosed.   Mid LM lesion is 60% stenosed.   Post intervention, there is a 80% residual stenosis.   1.  Very calcified right radial artery unable to accommodate sheath. 2.  Chronic total occlusion of proximal right coronary artery collateralized by the left circumflex. 3.  Moderate left main disease with an associated RFR value of 0.91 with moderate diffuse distal dilute disease of the LAD; the distal LAD should be treated medically and does not appear to be a good target for surgical revascularization. 4.  Focal 80% proximal left circumflex lesion with patent mid left circumflex stents.  The associated RFR was 0.71.  Despite multiple wires and a guide liner, the angulation from the left main/LAD into the circumflex could not be negotiated.  Therefore PCI was deferred and optimal medical therapy should be pursued. 5.  LVEDP of 9 mmHg.   Recommendation: Optimal medical therapy  .Diagnostic Dominance: Right  Intervention   _____________   History of Present Illness     [name] [name] is a 82 y.o. male with past medical history of CAD with stenting of the left circumflex and PDA artery, hypertension, hyperlipidemia who recently presented to the office on 6/18 and seen by Dr. Elease Hashimoto.  At this visit he reported some episodes of orthostasis as well as chest discomfort which was worse with exertion.  It was recommended that he  undergo outpatient stress test.  Myoview completed on 7/1 showed large defect in the inferior wall, inferior lateral wall, inferior septal wall and apex which were new changes in comparison from prior scan in 2008.  Given his symptoms he was set up for outpatient cardiac catheterization.  Hospital Course     Consultants: [facility], Ortho  CAD -- Cardiac catheterization noted above with CTO of RCA filling via collaterals from left circumflex, moderate left main disease with RFR of 0.91 with diffuse distal LAD disease with recommendations to treat medically.  Did have a focal 80% proximal left circumflex stenosis with patent mid left circumflex stents.  RFR was 0.71.  Despite multiple attempts with the wire and guide liner the angulation of the vessel could not be negotiated.  Did attempt balloon angioplasty with suboptimal result.   -- Recommendations for DAPT with aspirin/Plavix.  Cardiac rehab.  No recurrent chest pain prior to discharge.  Right forearm hematoma -- Initial cath access was attempted via the right radial artery, unable to accommodate she given heavily calcified area.  He did developed swelling with pain and numbness into his forearm.  He was evaluated by [facility]VS as well as orthopedics with no concern for compartment syndrome.  Site improved to the following day, patient reported significant improvement in symptoms.  Hyperlipidemia -- Continue atorvastatin 40 mg daily  She was seen by Dr. Allyson Sabal and deemed stable for discharge home.  Follow-up arranged in office.  Did the  patient have an acute coronary syndrome (MI, NSTEMI, STEMI, etc) this admission?:  No                               Did the patient have a percutaneous coronary intervention (stent / angioplasty)?:  No.          _____________  Discharge Vitals Blood pressure 124/64, pulse 80, temperature 97.9 F (36.6 C), temperature source Oral, resp. rate 16, height 5\' 8"  (1.727 m), weight 74.8 kg, SpO2 98 %.  Filed Weights    04/11/23 1006  Weight: 74.8 kg    Labs & Radiologic Studies    CBC Recent Labs    04/12/23 0150  WBC 7.4  HGB 13.1  HCT 38.3*  MCV 91.8  PLT 189   Basic Metabolic Panel Recent Labs    60/45/40 0150  NA 132*  K 3.6  CL 100  CO2 27  GLUCOSE 102*  BUN 16  CREATININE 0.82  CALCIUM 8.4*   Liver Function Tests No results for input(s): "AST", "ALT", "ALKPHOS", "BILITOT", "PROT", "ALBUMIN" in the last 72 hours. No results for input(s): "LIPASE", "AMYLASE" in the last 72 hours. High Sensitivity Troponin:   No results for input(s): "TROPONINIHS" in the last 720 hours.  BNP Invalid input(s): "POCBNP" D-Dimer No results for input(s): "DDIMER" in the last 72 hours. Hemoglobin A1C No results for input(s): "HGBA1C" in the last 72 hours. Fasting Lipid Panel No results for input(s): "CHOL", "HDL", "LDLCALC", "TRIG", "CHOLHDL", "LDLDIRECT" in the last 72 hours. Thyroid Function Tests No results for input(s): "TSH", "T4TOTAL", "T3FREE", "THYROIDAB" in the last 72 hours.  Invalid input(s): "FREET3" _____________  VAS [place] UPPER EXTREMITY ARTERIAL DUPLEX  Result Date: 04/12/2023  UPPER EXTREMITY DUPLEX STUDY Patient Name:  [name] [name]  Date of Exam:   04/11/2023 Medical Rec #: [id number]             Accession #:    [id number] Date of Birth: [birth year]             Patient Gender: M Patient Age:   70 years Exam Location:  [facility] Procedure:      VAS [place] UPPER EXTREMITY ARTERIAL DUPLEX Referring Phys: Ivin Booty ROBINS --------------------------------------------------------------------------------  Indications: Pain/swelling s/p cath, concern for compartment syndrome/bleed. History:     Patient has a history of catheterization via right radial artery.  Comparison Study: No prior studies. Performing Technologist: Jean Rosenthal RDMS, RVT  Examination Guidelines: A complete evaluation includes B-mode imaging, spectral Doppler, color Doppler, and power Doppler as needed of all  accessible portions of each vessel. Bilateral testing is considered an integral part of a complete examination. Limited examinations for reoccurring indications may be performed as noted.  Right Doppler Findings: +---------------+----------+--------+--------+-------------+ Site           PSV (cm/s)WaveformStenosisComments      +---------------+----------+--------+--------+-------------+ Subclavian Dist          biphasic                      +---------------+----------+--------+--------+-------------+ Axillary       100       biphasic                      +---------------+----------+--------+--------+-------------+ Brachial Prox  139       biphasic                      +---------------+----------+--------+--------+-------------+  Brachial Mid   133       biphasic                      +---------------+----------+--------+--------+-------------+ Brachial Dist  119       biphasic                      +---------------+----------+--------+--------+-------------+ Radial Prox    69        biphasic                      +---------------+----------+--------+--------+-------------+ Radial Mid     76        biphasic                      +---------------+----------+--------+--------+-------------+ Radial Dist    48        biphasic                      +---------------+----------+--------+--------+-------------+ Ulnar Prox     301       biphasic                      +---------------+----------+--------+--------+-------------+ Ulnar Mid      218       biphasic                      +---------------+----------+--------+--------+-------------+ Ulnar Dist     78        biphasic                      +---------------+----------+--------+--------+-------------+ Palmar Arch    18        biphasic        Flow reversal +---------------+----------+--------+--------+-------------+   A mixed echogenic structure is visualized at the mid forearm with ultrasound  characteristics of a hematoma. 2.8 x 1.7 cm in short axis.  Electronically signed by Gerarda Fraction on 04/12/2023 at 7:33:45 AM.    Final    CARDIAC CATHETERIZATION  Result Date: 04/11/2023   Prox RCA lesion is 100% stenosed.   Prox Cx to Mid Cx lesion is 20% stenosed.   Prox Cx lesion is 80% stenosed.   Dist LAD lesion is 99% stenosed.   Mid LM lesion is 60% stenosed.   Post intervention, there is a 80% residual stenosis. 1.  Very calcified right radial artery unable to accommodate sheath. 2.  Chronic total occlusion of proximal right coronary artery collateralized by the left circumflex. 3.  Moderate left main disease with an associated RFR value of 0.91 with moderate diffuse distal dilute disease of the LAD; the distal LAD should be treated medically and does not appear to be a good target for surgical revascularization. 4.  Focal 80% proximal left circumflex lesion with patent mid left circumflex stents.  The associated RFR was 0.71.  Despite multiple wires and a guide liner, the angulation from the left main/LAD into the circumflex could not be negotiated.  Therefore PCI was deferred and optimal medical therapy should be pursued. 5.  LVEDP of 9 mmHg. Recommendation: Optimal medical therapy.   MYOCARDIAL PERFUSION IMAGING  Result Date: 04/04/2023   Lexiscan stress shows no EKG changes   Myoview scan shows a large defect in the inferior wall (base, mid, distal), inferolateral wall (base, mid, distal) inferoseptal wall (base) and apex.  No change in the rest images   Consistent with large area of scar  and possible soft tissue attenuation (diaphragm)  No ischemia.   Nuclear stress EF: 48 %.   COmpared to images from scan from 05/09/2007, these changes are new   Disposition   Pt is being discharged home today in good condition.  Follow-up Plans & Appointments     Follow-up Information     Sharlene Dory, PA-C Follow up on 04/26/2023.   Specialty: Cardiology Why: at 8:50am for your cardiology follow up  appt Contact information: [address] [place] [address] [phone]                Discharge Instructions     Amb Referral to Cardiac Rehabilitation   Complete by: As directed    Diagnosis: PTCA   After initial evaluation and assessments completed: Virtual Based Care may be provided alone or in conjunction with Phase 2 Cardiac Rehab based on patient barriers.: Yes   Intensive Cardiac Rehabilitation (ICR) [facility] location only OR Traditional Cardiac Rehabilitation (TCR) *If criteria for ICR are not met will enroll in TCR [facility] only): Yes   Call MD for:  redness, tenderness, or signs of infection (pain, swelling, redness, odor or green/yellow discharge around incision site)   Complete by: As directed    Diet - low sodium heart healthy   Complete by: As directed    Discharge instructions   Complete by: As directed    Radial Site Care Refer to this sheet in the next few weeks. These instructions provide you with information on caring for yourself after your procedure. Your caregiver may also give you more specific instructions. Your treatment has been planned according to current medical practices, but problems sometimes occur. Call your caregiver if you have any problems or questions after your procedure. HOME CARE INSTRUCTIONS You may shower the day after the procedure. Remove the bandage (dressing) and gently wash the site with plain soap and water. Gently pat the site dry.  Do not apply powder or lotion to the site.  Do not submerge the affected site in water for 3 to 5 days.  Inspect the site at least twice daily.  Do not flex or bend the affected arm for 24 hours.  No lifting over 5 pounds (2.3 kg) for 5 days after your procedure.  Do not drive home if you are discharged the same day of the procedure. Have someone else drive you.  You may drive 24 hours after the procedure unless otherwise instructed by your caregiver.  What to expect: Any bruising will usually  fade within 1 to 2 weeks.  Blood that collects in the tissue (hematoma) may be painful to the touch. It should usually decrease in size and tenderness within 1 to 2 weeks.  SEEK IMMEDIATE MEDICAL CARE IF: You have unusual pain at the radial site.  You have redness, warmth, swelling, or pain at the radial site.  You have drainage (other than a small amount of blood on the dressing).  You have chills.  You have a fever or persistent symptoms for more than 72 hours.  You have a fever and your symptoms suddenly get worse.  Your arm becomes pale, cool, tingly, or numb.  You have heavy bleeding from the site. Hold pressure on the site.   Groin Site Care Refer to this sheet in the next few weeks. These instructions provide you with information on caring for yourself after your procedure. Your caregiver may also give you more specific instructions. Your treatment has been planned according  to current medical practices, but problems sometimes occur. Call your caregiver if you have any problems or questions after your procedure. HOME CARE INSTRUCTIONS You may shower 24 hours after the procedure. Remove the bandage (dressing) and gently wash the site with plain soap and water. Gently pat the site dry.  Do not apply powder or lotion to the site.  Do not sit in a bathtub, swimming pool, or whirlpool for 5 to 7 days.  No bending, squatting, or lifting anything over 10 pounds (4.5 kg) as directed by your caregiver.  Inspect the site at least twice daily.  Do not drive home if you are discharged the same day of the procedure. Have someone else drive you.  You may drive 24 hours after the procedure unless otherwise instructed by your caregiver.  What to expect: Any bruising will usually fade within 1 to 2 weeks.  Blood that collects in the tissue (hematoma) may be painful to the touch. It should usually decrease in size and tenderness within 1 to 2 weeks.  SEEK IMMEDIATE MEDICAL CARE IF: You have unusual  pain at the groin site or down the affected leg.  You have redness, warmth, swelling, or pain at the groin site.  You have drainage (other than a small amount of blood on the dressing).  You have chills.  You have a fever or persistent symptoms for more than 72 hours.  You have a fever and your symptoms suddenly get worse.  Your leg becomes pale, cool, tingly, or numb.  You have heavy bleeding from the site. Hold pressure on the site. .   Increase activity slowly   Complete by: As directed         Discharge Medications   Allergies as of 04/12/2023       Reactions   Hydrocodone Nausea And Vomiting   Simvastatin Other (See Comments)   Leg pain        Medication List     STOP taking these medications    aspirin 81 MG tablet Replaced by: aspirin EC 81 MG tablet   omeprazole 40 MG capsule Commonly known as: PRILOSEC       TAKE these medications    aspirin EC 81 MG tablet Take 1 tablet (81 mg total) by mouth daily. Swallow whole. Replaces: aspirin 81 MG tablet   atorvastatin 40 MG tablet Commonly known as: LIPITOR Take 1 tablet (40 mg total) by mouth daily.   Blue-Emu Hemp 10 % cream Generic drug: trolamine salicylate Apply 1 Application topically daily as needed for muscle pain.   CALCIUM + D PO Take 1 tablet by mouth daily.   clopidogrel 75 MG tablet Commonly known as: Plavix Take 1 tablet (75 mg total) by mouth daily.   Fish Oil 1200 MG Caps Take 1,200 mg by mouth daily.   fluticasone 50 MCG/ACT nasal spray Commonly known as: FLONASE Place 2 sprays into both nostrils daily. What changed:  when to take this reasons to take this   glucosamine-chondroitin 500-400 MG tablet Take 1 tablet by mouth daily.   ibuprofen 200 MG tablet Commonly known as: ADVIL Take 400 mg by mouth every 6 (six) hours as needed for moderate pain or mild pain.   Melatonin 10 MG Tabs Take 10 mg by mouth at bedtime.   metoprolol tartrate 25 MG tablet Commonly known as:  LOPRESSOR Take 1 tablet (25 mg total) by mouth 2 (two) times daily.   MULTIVITAMIN PO Take 1 tablet by mouth daily.  nitroGLYCERIN 0.4 MG SL tablet Commonly known as: NITROSTAT Place 1 tablet (0.4 mg total) under the tongue every 5 (five) minutes as needed for chest pain.   pantoprazole 40 MG tablet Commonly known as: Protonix Take 1 tablet (40 mg total) by mouth daily.   sildenafil 100 MG tablet Commonly known as: VIAGRA Take 1 tablet (100 mg total) by mouth daily as needed for erectile dysfunction.   tamsulosin 0.4 MG Caps capsule Commonly known as: FLOMAX Take 1 capsule (0.4 mg total) by mouth daily.   traZODone 50 MG tablet Commonly known as: DESYREL TAKE 1/2 TO 1 TABLET AT BEDTIME AS NEEDED FOR SLEEP What changed:  how much to take how to take this when to take this additional instructions           Outstanding Labs/Studies   N/a   Duration of Discharge Encounter   Greater than 30 minutes including physician time.  Signed, Laverda Page, NP 04/12/2023, 11:12 AM   Agree with note by Laverda Page NP-C  Patient mated for same-day cath because of progressive chest pain and abnormal stress test.  He had prior circumflex stent.  Dr.Thukkani initially attempted radial approach but this was unsuccessful and ultimately he went femoral.  The patient had moderate distal left main disease, and occluded RCA, patent circumflex stent with high-grade proximal circumflex disease.  Because of the angulation of the left main and calcification he was unable to deliver stent.  He did do balloon angioplasty.  The patient has some swelling in his right arm which was evaluated by vascular surgery and is since improved.  His groin is stable.  He is asymptomatic this morning and has ambulated.  Okay for discharge home.  He will be on clopidogrel.  TOC 7 after which she will see Dr. Elease Hashimoto back in follow-up.  Runell Gess, M.D., FACP, [facility], [facility] [address] [place], [place]  [address]  [phone] 04/12/2023 11:31 AM

## 2023-04-12 NOTE — Research (Addendum)
Selution Informed Consent   Subject Name: [name] [name]  Subject met inclusion and exclusion criteria.  The informed consent form, study requirements and expectations were reviewed with the subject and questions and concerns were addressed prior to the signing of the consent form.  The subject verbalized understanding of the trial requirements.  The subject agreed to participate in the Selution trial and signed the informed consent on 04/11/2023.  The informed consent was obtained prior to performance of any protocol-specific procedures for the subject.  A copy of the signed informed consent was given to the subject and a copy was placed in the subject's medical record.   Tristina Sahagian    Screen fail

## 2023-04-12 NOTE — Progress Notes (Signed)
Pt has been going to [facility], declined feeling he needed to ambulate hall. Sts his groin feels well. Reviewed exercise and CRPII. Encouraged elevation of forearm and ice. Eager for d/c.  1010-1020 Ethelda Chick BS, ACSM-CEP 04/12/2023 10:21 AM

## 2023-04-12 NOTE — Plan of Care (Signed)
Problem: Education: Goal: Understanding of CV disease, CV risk reduction, and recovery process will improve 04/12/2023 1127 by Georgiann Mccoy, RN Outcome: Adequate for Discharge 04/12/2023 0808 by Georgiann Mccoy, RN Outcome: Progressing Goal: Individualized Educational Video(s) 04/12/2023 1127 by Georgiann Mccoy, RN Outcome: Adequate for Discharge 04/12/2023 0808 by Georgiann Mccoy, RN Outcome: Progressing   Problem: Activity: Goal: Ability to return to baseline activity level will improve 04/12/2023 1127 by Georgiann Mccoy, RN Outcome: Adequate for Discharge 04/12/2023 0808 by Georgiann Mccoy, RN Outcome: Progressing   Problem: Cardiovascular: Goal: Ability to achieve and maintain adequate cardiovascular perfusion will improve 04/12/2023 1127 by Georgiann Mccoy, RN Outcome: Adequate for Discharge 04/12/2023 0808 by Georgiann Mccoy, RN Outcome: Progressing Goal: Vascular access site(s) Level 0-1 will be maintained 04/12/2023 1127 by Georgiann Mccoy, RN Outcome: Adequate for Discharge 04/12/2023 0808 by Georgiann Mccoy, RN Outcome: Progressing   Problem: Health Behavior/Discharge Planning: Goal: Ability to safely manage health-related needs after discharge will improve 04/12/2023 1127 by Georgiann Mccoy, RN Outcome: Adequate for Discharge 04/12/2023 0808 by Georgiann Mccoy, RN Outcome: Progressing   Problem: Education: Goal: Knowledge of General Education information will improve Description: Including pain rating scale, medication(s)/side effects and non-pharmacologic comfort measures 04/12/2023 1127 by Georgiann Mccoy, RN Outcome: Adequate for Discharge 04/12/2023 0808 by Georgiann Mccoy, RN Outcome: Progressing   Problem: Health Behavior/Discharge Planning: Goal: Ability to manage health-related needs will improve 04/12/2023 1127 by Georgiann Mccoy, RN Outcome: Adequate for Discharge 04/12/2023 0808 by Georgiann Mccoy, RN Outcome: Progressing   Problem: Clinical  Measurements: Goal: Ability to maintain clinical measurements within normal limits will improve 04/12/2023 1127 by Georgiann Mccoy, RN Outcome: Adequate for Discharge 04/12/2023 0808 by Georgiann Mccoy, RN Outcome: Progressing Goal: Will remain free from infection 04/12/2023 1127 by Georgiann Mccoy, RN Outcome: Adequate for Discharge 04/12/2023 0808 by Georgiann Mccoy, RN Outcome: Progressing Goal: Diagnostic test results will improve 04/12/2023 1127 by Georgiann Mccoy, RN Outcome: Adequate for Discharge 04/12/2023 0808 by Georgiann Mccoy, RN Outcome: Progressing Goal: Respiratory complications will improve 04/12/2023 1127 by Georgiann Mccoy, RN Outcome: Adequate for Discharge 04/12/2023 0808 by Georgiann Mccoy, RN Outcome: Progressing Goal: Cardiovascular complication will be avoided 04/12/2023 1127 by Georgiann Mccoy, RN Outcome: Adequate for Discharge 04/12/2023 0808 by Georgiann Mccoy, RN Outcome: Progressing   Problem: Activity: Goal: Risk for activity intolerance will decrease 04/12/2023 1127 by Georgiann Mccoy, RN Outcome: Adequate for Discharge 04/12/2023 0808 by Georgiann Mccoy, RN Outcome: Progressing   Problem: Nutrition: Goal: Adequate nutrition will be maintained 04/12/2023 1127 by Georgiann Mccoy, RN Outcome: Adequate for Discharge 04/12/2023 0808 by Georgiann Mccoy, RN Outcome: Progressing   Problem: Coping: Goal: Level of anxiety will decrease 04/12/2023 1127 by Georgiann Mccoy, RN Outcome: Adequate for Discharge 04/12/2023 0808 by Georgiann Mccoy, RN Outcome: Progressing   Problem: Elimination: Goal: Will not experience complications related to bowel motility 04/12/2023 1127 by Georgiann Mccoy, RN Outcome: Adequate for Discharge 04/12/2023 0808 by Georgiann Mccoy, RN Outcome: Progressing Goal: Will not experience complications related to urinary retention 04/12/2023 1127 by Georgiann Mccoy, RN Outcome: Adequate for Discharge 04/12/2023 0808 by Georgiann Mccoy,  RN Outcome: Progressing   Problem: Pain Managment: Goal: General experience of comfort will improve 04/12/2023 1127 by Georgiann Mccoy, RN Outcome: Adequate for Discharge 04/12/2023 0808 by Georgiann Mccoy, RN Outcome: Progressing   Problem:  Safety: Goal: Ability to remain free from injury will improve 04/12/2023 1127 by Georgiann Mccoy, RN Outcome: Adequate for Discharge 04/12/2023 0808 by Georgiann Mccoy, RN Outcome: Progressing   Problem: Skin Integrity: Goal: Risk for impaired skin integrity will decrease 04/12/2023 1127 by Georgiann Mccoy, RN Outcome: Adequate for Discharge 04/12/2023 0808 by Georgiann Mccoy, RN Outcome: Progressing

## 2023-04-14 LAB — LIPOPROTEIN A (LPA): Lipoprotein (a): 26.4 nmol/L (ref <75.0)

## 2023-04-15 ENCOUNTER — Telehealth (HOSPITAL_COMMUNITY): Payer: Self-pay

## 2023-04-15 NOTE — Telephone Encounter (Signed)
Pt insurance is active and benefits verified through [facility] Co-pay $25, DED 0/0 met, out of pocket $3,600/$295.64 met, co-insurance 0%. no pre-authorization required. Passport, 04/15/2023@10 :28, REF# [id number]   How many CR sessions are covered? (for ICR)72 Is this a lifetime maximum or an annual maximum? annual Has the member used any of these services to date? no Is there a time limit (weeks/months) on start of program and/or program completion? no     Will contact patient to see if he is interested in the Cardiac Rehab Program. If interested, patient will need to complete follow up appt. Once completed, patient will be contacted for scheduling upon review by the RN Navigator.

## 2023-04-19 ENCOUNTER — Telehealth: Payer: Self-pay | Admitting: Cardiovascular Disease

## 2023-04-19 ENCOUNTER — Encounter: Payer: Self-pay | Admitting: Family Medicine

## 2023-04-19 ENCOUNTER — Ambulatory Visit (INDEPENDENT_AMBULATORY_CARE_PROVIDER_SITE_OTHER): Payer: Medicare HMO | Admitting: Family Medicine

## 2023-04-19 VITALS — BP 120/70 | HR 69 | Temp 97.0°F | Ht 68.0 in | Wt 162.2 lb

## 2023-04-19 DIAGNOSIS — E559 Vitamin D deficiency, unspecified: Secondary | ICD-10-CM | POA: Diagnosis not present

## 2023-04-19 DIAGNOSIS — Z79899 Other long term (current) drug therapy: Secondary | ICD-10-CM | POA: Diagnosis not present

## 2023-04-19 DIAGNOSIS — I10 Essential (primary) hypertension: Secondary | ICD-10-CM | POA: Diagnosis not present

## 2023-04-19 DIAGNOSIS — E785 Hyperlipidemia, unspecified: Secondary | ICD-10-CM

## 2023-04-19 DIAGNOSIS — Z0000 Encounter for general adult medical examination without abnormal findings: Secondary | ICD-10-CM

## 2023-04-19 LAB — COMPREHENSIVE METABOLIC PANEL
ALT: 16 U/L (ref 0–53)
AST: 17 U/L (ref 0–37)
Albumin: 4.1 g/dL (ref 3.5–5.2)
Alkaline Phosphatase: 46 U/L (ref 39–117)
BUN: 22 mg/dL (ref 6–23)
CO2: 29 mEq/L (ref 19–32)
Calcium: 9.5 mg/dL (ref 8.4–10.5)
Chloride: 103 mEq/L (ref 96–112)
Creatinine, Ser: 0.83 mg/dL (ref 0.40–1.50)
GFR: 83.05 mL/min (ref >60.00)
Glucose, Bld: 95 mg/dL (ref 70–99)
Potassium: 4.1 mEq/L (ref 3.5–5.1)
Sodium: 136 mEq/L (ref 135–145)
Total Bilirubin: 0.8 mg/dL (ref 0.2–1.2)
Total Protein: 6.7 g/dL (ref 6.0–8.3)

## 2023-04-19 LAB — CBC WITH DIFFERENTIAL/PLATELET
Basophils Absolute: 0 10*3/uL (ref 0.0–0.1)
Basophils Relative: 0.8 % (ref 0.0–3.0)
Eosinophils Absolute: 0.4 10*3/uL (ref 0.0–0.7)
Eosinophils Relative: 5.8 % — ABNORMAL HIGH (ref 0.0–5.0)
HCT: 41.5 % (ref 39.0–52.0)
Hemoglobin: 14.1 g/dL (ref 13.0–17.0)
Lymphocytes Relative: 26 % (ref 12.0–46.0)
Lymphs Abs: 1.6 10*3/uL (ref 0.7–4.0)
MCHC: 34 g/dL (ref 30.0–36.0)
MCV: 90.5 fl (ref 78.0–100.0)
Monocytes Absolute: 0.6 10*3/uL (ref 0.1–1.0)
Monocytes Relative: 9.7 % (ref 3.0–12.0)
Neutro Abs: 3.5 10*3/uL (ref 1.4–7.7)
Neutrophils Relative %: 57.7 % (ref 43.0–77.0)
Platelets: 240 10*3/uL (ref 150.0–400.0)
RBC: 4.59 Mil/uL (ref 4.22–5.81)
RDW: 13.2 % (ref 11.5–15.5)
WBC: 6.1 10*3/uL (ref 4.0–10.5)

## 2023-04-19 LAB — VITAMIN B12: Vitamin B-12: 371 pg/mL (ref 211–911)

## 2023-04-19 LAB — VITAMIN D 25 HYDROXY (VIT D DEFICIENCY, FRACTURES): VITD: 49.27 ng/mL (ref 30.00–100.00)

## 2023-04-19 MED ORDER — PANTOPRAZOLE SODIUM 40 MG PO TBEC: 40.0000 mg | DELAYED_RELEASE_TABLET | Freq: Every day | ORAL | 3 refills | Status: DC

## 2023-04-19 NOTE — Progress Notes (Signed)
Phone: [phone]   Subjective:  Patient presents today for their annual physical. Chief complaint-noted.   See problem oriented charting- ROS- full  review of systems was completed and negative  except for: some chest pressure with activity- not worsening- unchanged after cath , sleep issues with pain with hematoma  The following were reviewed and entered/updated in epic: Past Medical History:  Diagnosis Date   Anxiety    Coronary artery disease    status post PTCA and stenting of his left circumflex artery and right coronary artery   GERD (gastroesophageal reflux disease)    H/O heart artery stent    Hyperlipidemia    Hypertension    Pt denies   Reflux    omeprazole 40mg    Patient Active Problem List   Diagnosis Date Noted   CAD (coronary artery disease) 06/21/2011    Priority: High   Aortic atherosclerosis (HCC) 12/17/2019    Priority: Medium    Insomnia 04/11/2019    Priority: Medium    Claudication of lower extremity (HCC) 10/24/2018    Priority: Medium    BPH (benign prostatic hyperplasia) 09/06/2016    Priority: Medium    Essential hypertension 08/14/2016    Priority: Medium    Hyperlipidemia 06/21/2011    Priority: Medium    Cholelithiasis with chronic cholecystitis 01/12/2021    Priority: Low   Allergic rhinitis 01/28/2020    Priority: Low   Plantar fasciitis of left foot 06/21/2018    Priority: Low   Anxiety state 09/06/2016    Priority: Low   History of adenomatous polyp of colon 08/14/2016    Priority: Low   Vitamin D deficiency 08/14/2016    Priority: Low   GERD (gastroesophageal reflux disease)     Priority: Low   Hematoma of right forearm 04/12/2023   Thyromegaly 04/08/2021   Past Surgical History:  Procedure Laterality Date   Benign Cyst     on back   CARDIOVASCULAR STRESS TEST  05/09/2007   EF 64%   carpal tunnel left     CATARACT EXTRACTION     bilateral- april 15th with repeat 3 weeks later   CHOLECYSTECTOMY N/A 01/12/2021    Procedure: LAPAROSCOPIC CHOLECYSTECTOMY WITH INTRAOPERATIVE CHOLANGIOGRAM;  Surgeon: Darnell Level, MD;  Location: [facility];  Service: General;  Laterality: N/A;  90MIN/RM2   CORONARY ANGIOPLASTY WITH STENT PLACEMENT     CORONARY BALLOON ANGIOPLASTY N/A 04/11/2023   Procedure: CORONARY BALLOON ANGIOPLASTY;  Surgeon: Orbie Pyo, MD;  Location: [facility];  Service: Cardiovascular;  Laterality: N/A;   CORONARY PRESSURE/FFR STUDY Left 04/11/2023   Procedure: CORONARY PRESSURE/FFR STUDY;  Surgeon: Orbie Pyo, MD;  Location: [facility];  Service: Cardiovascular;  Laterality: Left;   CYSTECTOMY     removed from finger   cystoscopy     LEFT HEART CATH AND CORONARY ANGIOGRAPHY N/A 04/11/2023   Procedure: LEFT HEART CATH AND CORONARY ANGIOGRAPHY;  Surgeon: Orbie Pyo, MD;  Location: [facility];  Service: Cardiovascular;  Laterality: N/A;   LIPOMA EXCISION     Back   right knee surgery     arthroscopyc 02/2010    Family History  Problem Relation Age of Onset   Bladder Cancer Mother        passed age 60.    Heart attack Father        30 passed from this   Alcohol abuse Father     Medications- reviewed and updated Current Outpatient Medications  Medication  Sig Dispense Refill   aspirin EC 81 MG tablet Take 1 tablet (81 mg total) by mouth daily. Swallow whole. 30 tablet 0   atorvastatin (LIPITOR) 40 MG tablet Take 1 tablet (40 mg total) by mouth daily. 90 tablet 3   Calcium Carbonate-Vitamin D (CALCIUM + D PO) Take 1 tablet by mouth daily.     clopidogrel (PLAVIX) 75 MG tablet Take 1 tablet (75 mg total) by mouth daily. 30 tablet 12   fluticasone (FLONASE) 50 MCG/ACT nasal spray Place 2 sprays into both nostrils daily. (Patient taking differently: Place 2 sprays into both nostrils daily as needed for allergies.) 48 g 1   glucosamine-chondroitin 500-400 MG tablet Take 1 tablet by mouth daily.     ibuprofen (ADVIL) 200 MG tablet Take 400 mg by mouth every 6 (six)  hours as needed for moderate pain or mild pain.     Melatonin 10 MG TABS Take 10 mg by mouth at bedtime.     metoprolol tartrate (LOPRESSOR) 25 MG tablet Take 1 tablet (25 mg total) by mouth 2 (two) times daily. 180 tablet 3   Multiple Vitamin (MULTIVITAMIN PO) Take 1 tablet by mouth daily.     nitroGLYCERIN (NITROSTAT) 0.4 MG SL tablet Place 1 tablet (0.4 mg total) under the tongue every 5 (five) minutes as needed for chest pain. 25 tablet 6   Omega-3 Fatty Acids (FISH OIL) 1200 MG CAPS Take 1,200 mg by mouth daily.     sildenafil (VIAGRA) 100 MG tablet Take 1 tablet (100 mg total) by mouth daily as needed for erectile dysfunction. 30 tablet 3   tamsulosin (FLOMAX) 0.4 MG CAPS capsule Take 1 capsule (0.4 mg total) by mouth daily. 90 capsule 3   traZODone (DESYREL) 50 MG tablet TAKE 1/2 TO 1 TABLET AT BEDTIME AS NEEDED FOR SLEEP (Patient taking differently: Take 50 mg by mouth at bedtime.) 90 tablet 3   trolamine salicylate (BLUE-EMU HEMP) 10 % cream Apply 1 Application topically daily as needed for muscle pain.     pantoprazole (PROTONIX) 40 MG tablet Take 1 tablet (40 mg total) by mouth daily. 90 tablet 3   No current facility-administered medications for this visit.    Allergies-reviewed and updated Allergies  Allergen Reactions   Hydrocodone Nausea And Vomiting   Simvastatin Other (See Comments)    Leg pain     Social History   Social History Narrative   Widowed march 2023.  3 grown children, 3 grown stepkids. 9 grandkids. [place] 3 in 2024.    Lives in [place] now      Retired from [employer]. Over 46 years.    Objective  Objective:  BP 120/70   Pulse 69   Temp (!) 97 F (36.1 C)   Ht 5\' 8"  (1.727 m)   Wt 162 lb 3.2 oz (73.6 kg)   SpO2 97%   BMI 24.66 kg/m  Gen: NAD, resting comfortably HEENT: Mucous membranes are moist. Oropharynx normal Neck: stable thyromegaly CV: RRR no murmurs rubs or gallops Lungs: CTAB no crackles, wheeze, rhonchi Abdomen:  soft/nontender/nondistended/normal bowel sounds. No rebound or guarding.  Ext: no edema Skin: warm, dry Neuro: grossly normal, moves all extremities, PERRLA   Assessment and Plan  82 y.o. male presenting for annual physical.  Health Maintenance counseling: 1. Anticipatory guidance: Patient counseled regarding regular dental exams -q6 months, eye exams -yearly,  avoiding smoking and second hand smoke, limiting alcohol to 2 beverages per day - doesn't drink at all, no illicit drugs .  2. Risk factor reduction:  Advised patient of need for regular exercise and diet rich and fruits and vegetables to reduce risk of heart attack and stroke.  Exercise- limited after recent cath and with hematoma- he's hoping to get back to bowling as he improves.  Diet/weight management-within 1 pounds of last year- tries to eat reasonably healthy.  Wt Readings from Last 3 Encounters:  04/19/23 162 lb 3.2 oz (73.6 kg)  04/11/23 165 lb (74.8 kg)  04/04/23 165 lb (74.8 kg)  3. Immunizations/screenings/ancillary studies-discussed flu and COVID shot in the fall  Immunization History  Administered Date(s) Administered   Fluad Quad(high Dose 65+) 06/13/2019, 06/24/2020, 08/18/2021   Influenza, High Dose Seasonal PF 08/28/2017   Influenza,inj,Quad PF,6+ Mos 06/18/2022   Influenza-Unspecified 07/28/2016   PFIZER(Purple Top)SARS-COV-2 Vaccination 10/15/2019, 11/04/2019, 07/11/2020   Pfizer Covid-19 Vaccine Bivalent Booster 41yrs & up 09/15/2021   Pneumococcal Conjugate-13 08/16/2014   Pneumococcal Polysaccharide-23 07/08/2010   Tdap 07/08/2010, 04/16/2022   Zoster Recombinant(Shingrix) 04/13/2022, 06/18/2022  4. Prostate cancer screening-   past age based screening recommendations-stable urinary symptoms Lab Results  Component Value Date   PSA 0.52 04/08/2021   PSA 0.74 10/20/2017   5. Colon cancer screening - history adenomatous polyps 09/16/2016 with Dr. Pierre Bali reports repeat in 2021 with 5 year repeat with Dr.  Elnoria Howard  6. Skin cancer screening- no recent visit-considering. advised regular sunscreen use. Denies worrisome, changing, or new skin lesions.  7. Smoking associated screening (lung cancer screening, AAA screen 65-75, UA)-never smoker 8. STD screening - not dating/not sexually active  Status of chronic or acute concerns   #CAD-follows with Dr. Elease Hashimoto with history of stent  #hyperlipidemia with Aortic atherosclerosis #Claudication lower extremity S: Medication: atorvastatin 40 mg ,asa 81 mg with addition of Plavix on 04/11/2023, metoprolol 25 mg twice daily -Claudication in thighs with climbing stairs-declines recently  -Most recent catheterization 04/11/2023-unable to place stent-balloon angioplasty completed with suboptimal result -had hematoma in right arm that has been uncomfortable- avoiding ibuprofen for most part but ended up taking one dose last night- can get comfy in day but bothers him at night. Not worsening but really making sleep difficult Lab Results  Component Value Date   CHOL 103 03/23/2023   HDL 40 03/23/2023   LDLCALC 49 03/23/2023   LDLDIRECT 58.0 04/08/2021   TRIG 61 03/23/2023   CHOLHDL 2.6 03/23/2023    A/P: CAD-stable angina- unfortunately not able to stent occlusion - and suboptimal balloon angioplasty recently- continue current medications including added plavix  hyperlipidemia- stable with LDL even under 55- Continue current meds for now   Claudication lower extremity-no recent major issues - continue maximal medical therapy  -lpa not elevated  #hypertension S: medication: Metoprolol 25 mg twice daily A/P: stable- continue current medicines   # GERD-with history of esophageal dilation with Dr. Kinnie Scales in 2011 (no longer sees) S:Medication: Omeprazole 40 mg --> pantoprazole 40 milligrams B12 levels related to PPI use: Ordered 2023 and he takes a B complex daily- no B12 specifically A/P: reflux stable- continue current medicines     #BPH S: Medication:  Tamsulosin 0.4 mg A/P: stable but imperfect control- continue current medicines  . Discussed increasing dose but risk of orthostasis and opts out  #Vitamin D deficiency S: Medication: Multivitamin plus calcium plus vitamin D Last vitamin D Lab Results  Component Value Date   VD25OH 52.01 04/13/2022  A/P: hopefully stable- update vitamin D today. Continue current meds for now     #Insomnia-takes melatonin 5 mg  and/or trazodone 50 mg- generally helps but with hematoma issues   #Thyromegaly-noted on imaging 10/31/2018-no nodules.  Will intermittently check TSH Lab Results  Component Value Date   TSH 0.66 03/17/2023   # tripped in yard and strained his hamstring- was carrying materials- encouraged to be very careful transporting things  Recommended follow up: Return in about 6 months (around 10/20/2023) for followup or sooner if needed.Schedule b4 you leave. Future Appointments  Date Time Provider Department Center  04/26/2023  8:50 AM Sharlene Dory, [place] [facility]  06/08/2023  9:40 AM Nahser, Deloris Ping, [place] [facility]  03/13/2024  8:45 AM [facility] ANNUAL WELLNESS VISIT 1 [facility]   Lab/Order associations: fasting   ICD-10-CM   1. Preventative health care  Z00.00     2. Hyperlipidemia, unspecified hyperlipidemia type  E78.5     3. Essential hypertension  I10 Comprehensive metabolic panel    CBC with Differential/Platelet    4. Vitamin D deficiency  E55.9 VITAMIN D 25 Hydroxy (Vit-D Deficiency, Fractures)    5. High risk medication use  Z79.899 Vitamin B12      Meds ordered this encounter  Medications   pantoprazole (PROTONIX) 40 MG tablet    Sig: Take 1 tablet (40 mg total) by mouth daily.    Dispense:  90 tablet    Refill:  3    Return precautions advised.  [name], MD

## 2023-04-19 NOTE — Telephone Encounter (Signed)
Spoke with patient who states he continues to have pain in right arm and wrist from heart cath. He states it is also bruised. He saw his PCP today, he assessed it and did not make any recommendations. Advised patient to use ice packs and/or heat for comfort, elevate and use tylenol as needed.  Patient verbalized understanding and had no questions.

## 2023-04-19 NOTE — Patient Instructions (Addendum)
Please stop by lab before you go If you have mychart- we will send your results within 3 business days of [place] receiving them.  If you do not have mychart- we will call you about results within 5 business days of [place] receiving them.  *please also note that you will see labs on mychart as soon as they post. I will later go in and write notes on them- will say "notes from Dr. Durene Cal"   No changes today unless labs lead [place] to make changes  Recommended follow up: Return in about 6 months (around 10/20/2023) for followup or sooner if needed.Schedule b4 you leave.

## 2023-04-19 NOTE — Telephone Encounter (Signed)
Pt called in stating his hand is still in pain from his cath last week and he would like something called in to the drug store. Please advise.

## 2023-04-25 NOTE — Progress Notes (Unsigned)
Cardiology Office Note:  .   Date:  04/26/2023  ID:  [name] [name], DOB 08/07/1941, MRN [id number] PCP: Shelva Majestic, MD  [facility] Providers Cardiologist:  [name], MD {  History of Present Illness: .   [name] [name] is a 82 y.o. male with a past medical history of coronary artery disease status post PTCA and stenting of the left circumflex artery as well as PTCA of the posterior descending artery January 2002, and hyperlipidemia.  He presents for follow-up today.  Has been seen by Dr. Elease Hashimoto since April 2015.  Seems like he has been very active these past few years going to the [facility] 6 days a week.  Bowling regularly.  Wife passed March 2022.  Last seen March 22, 2023.  He was having some orthostatic hypotension (dizziness when he stood up while fixing the mailbox).  Rare episodes of mid chest discomfort, does not feel like his angina.  Does worsen with exertion but gets better with rest.  Lexiscan Myoview ordered.  Lexiscan showed large area of scar in the inferior wall, inferior lateral wall, inferior septal wall, and apex.  Ultimately, cardiac catheterization was ordered.  No new stents were placed and medical optimization was recommended.  Today, he tells me that he still struggles with occasional chest tightness.  He is having pain down his back (cardiac catheterization side).  This has been improving with Voltaren gel, elevation, and ice.  Also suggested heat and a light Ace wrap bandage for support.  Ultrasound already performed in the hospital without DVT.  He does not have chest pain cardiac rehab.  We also encouraged him to keep track of his blood sugar and heart rate.  We discussed starting Imdur but not taking viagra while on Imdur.  He states he does not use it really anymore. Otherwise, doing well.  Reports no shortness of breath nor dyspnea on exertion. No edema, orthopnea, PND. Reports no palpitations.    ROS: Pertinent ROS in HPI  Studies  Reviewed: .       Cardiac catheterization 04/11/2023 Left Main  Mid LM lesion is 60% stenosed.    Left Anterior Descending  There is moderate diffuse disease throughout the vessel.  Dist LAD lesion is 99% stenosed.    Left Circumflex  Prox Cx lesion is 80% stenosed.  Prox Cx to Mid Cx lesion is 20% stenosed. The lesion was previously treated .    Right Coronary Artery  Prox RCA lesion is 100% stenosed.    Third Right Posterolateral Branch  Collaterals  3rd RPL filled by collaterals from 3rd Mrg.    Collaterals  3rd RPL filled by collaterals from 3rd Mrg.      Intervention   Prox Cx lesion  Angioplasty  Post-Intervention Lesion Assessment  The intervention was unsuccessful. Pre-interventional TIMI flow is 3. Post-intervention TIMI flow is 3.  There is a 80% residual stenosis post intervention.     Coronary Diagrams  Diagnostic Dominance: Right  Intervention         Physical Exam:   VS:  BP 104/60   Pulse 71   Ht 5\' 8"  (1.727 m)   Wt 161 lb 6.4 oz (73.2 kg)   SpO2 100%   BMI 24.54 kg/m    Wt Readings from Last 3 Encounters:  04/26/23 161 lb 6.4 oz (73.2 kg)  04/19/23 162 lb 3.2 oz (73.6 kg)  04/11/23 165 lb (74.8 kg)    GEN: Well nourished, well developed in  no acute distress NECK: No JVD; No carotid bruits CARDIAC: RRR, no murmurs, rubs, gallops RESPIRATORY:  Clear to auscultation without rales, wheezing or rhonchi  ABDOMEN: Soft, non-tender, non-distended EXTREMITIES: Small amount of edema right arm as well as ecchymosis  ASSESSMENT AND PLAN: .   1. coronary artery disease status postcardiac catheterization with no new stents -90 day Imdur to [facility] -Asked him to keep a close eye on his BP -Continue aspirin 81 g daily, Plavix 75 mg daily, Lipitor 40 mg daily, Lopressor 25 mg twice a day, nitro as needed, fish oil 1200 mg daily -Okay to start cardiac rehab  2.  Hyperlipidemia -Most recent LDL 49 (03/2023) -Continue current medication  regimen -Will be due for updated lipid panel next year   3. Right hematoma/ecchymosis -Continue ice, Voltaren gel, and elevation -Add heat, and a light Ace wrap for compression -Abstain from bowling for another 4 weeks    Cardiac Rehabilitation Eligibility Assessment  The patient is ready to start cardiac rehabilitation from a cardiac standpoint.      Dispo: He can follow-up in 2 months with Dr. Elease Hashimoto.  Signed, Sharlene Dory, PA-C

## 2023-04-26 ENCOUNTER — Encounter: Payer: Self-pay | Admitting: Physician Assistant

## 2023-04-26 ENCOUNTER — Ambulatory Visit: Payer: Medicare HMO | Attending: Physician Assistant | Admitting: Physician Assistant

## 2023-04-26 ENCOUNTER — Other Ambulatory Visit: Payer: Self-pay

## 2023-04-26 VITALS — BP 104/60 | HR 71 | Ht 68.0 in | Wt 161.4 lb

## 2023-04-26 DIAGNOSIS — E785 Hyperlipidemia, unspecified: Secondary | ICD-10-CM | POA: Diagnosis not present

## 2023-04-26 DIAGNOSIS — I2510 Atherosclerotic heart disease of native coronary artery without angina pectoris: Secondary | ICD-10-CM | POA: Diagnosis not present

## 2023-04-26 MED ORDER — CLOPIDOGREL BISULFATE 75 MG PO TABS: 75.0000 mg | ORAL_TABLET | Freq: Every day | ORAL | 3 refills | Status: DC

## 2023-04-26 MED ORDER — ISOSORBIDE MONONITRATE ER 30 MG PO TB24: 15.0000 mg | ORAL_TABLET | Freq: Every day | ORAL | 3 refills | Status: DC

## 2023-04-26 NOTE — Patient Instructions (Addendum)
Medication Instructions:   START TAKING:  IMDUR 15 MG ONCE A DAY   *If you need a refill on your cardiac medications before your next appointment, please call your pharmacy*   Lab Work: NONE ORDERED  TODAY    If you have labs (blood work) drawn today and your tests are completely normal, you will receive your results only by: MyChart Message (if you have MyChart) OR A paper copy in the mail If you have any lab test that is abnormal or we need to change your treatment, we will call you to review the results.   Testing/Procedures: NONE ORDERED  TODAY   Follow-Up: At [facility], you and your health needs are our priority.  As part of our continuing mission to provide you with exceptional heart care, we have created designated Provider Care Teams.  These Care Teams include your primary Cardiologist (physician) and Advanced Practice Providers (APPs -  Physician Assistants and Nurse Practitioners) who all work together to provide you with the care you need, when you need it.  We recommend signing up for the patient portal called "MyChart".  Sign up information is provided on this After Visit Summary.  MyChart is used to connect with patients for Virtual Visits (Telemedicine).  Patients are able to view lab/test results, encounter notes, upcoming appointments, etc.  Non-urgent messages can be sent to your provider as well.   To learn more about what you can do with MyChart, go to [email or web address].     Your next appointment: You have been referred to CARDIAC REHAB ( SOMEONE WILL CONTACT YOU WITH FURTHER STEPS)    AS SCHEDULED   Provider:   [name], MD     Other Instructions   USE ACE WRAP HEAT ICE WITH ALSO ELEVATING YOUR RIGHT ARM

## 2023-05-06 ENCOUNTER — Telehealth: Payer: Self-pay | Admitting: Physician Assistant

## 2023-05-06 NOTE — Telephone Encounter (Signed)
Called and clarified w pharmacist at [facility] okay to split the IMDUR.

## 2023-05-06 NOTE — Telephone Encounter (Signed)
Pt c/o medication issue:  1. Name of Medication:   isosorbide mononitrate (IMDUR) 30 MG 24 hr tablet    2. How are you currently taking this medication (dosage and times per day)?  Take 0.5 tablets (15 mg total) by mouth daily.       3. Are you having a reaction (difficulty breathing--STAT)? No  4. What is your medication issue? Pharmacy is requesting a callback for verification on how pt should be taking this medication since it's not usually cut in half. Please advise

## 2023-05-09 DIAGNOSIS — N401 Enlarged prostate with lower urinary tract symptoms: Secondary | ICD-10-CM | POA: Diagnosis not present

## 2023-05-09 DIAGNOSIS — N3000 Acute cystitis without hematuria: Secondary | ICD-10-CM | POA: Diagnosis not present

## 2023-05-09 DIAGNOSIS — R8279 Other abnormal findings on microbiological examination of urine: Secondary | ICD-10-CM | POA: Diagnosis not present

## 2023-05-09 DIAGNOSIS — R3915 Urgency of urination: Secondary | ICD-10-CM | POA: Diagnosis not present

## 2023-05-09 DIAGNOSIS — R351 Nocturia: Secondary | ICD-10-CM | POA: Diagnosis not present

## 2023-05-11 ENCOUNTER — Other Ambulatory Visit (HOSPITAL_COMMUNITY): Payer: Self-pay

## 2023-05-11 ENCOUNTER — Telehealth: Payer: Self-pay | Admitting: Cardiovascular Disease

## 2023-05-11 NOTE — Telephone Encounter (Signed)
  Pt c/o medication issue:  1. Name of Medication: pantoprazole (PROTONIX) 40 MG tablet   2. How are you currently taking this medication (dosage and times per day)? Take 1 tablet (40 mg total) by mouth daily.   3. Are you having a reaction (difficulty breathing--STAT)? No   4. What is your medication issue? Pt said, he needs prior auth for this medication

## 2023-05-12 ENCOUNTER — Other Ambulatory Visit (HOSPITAL_COMMUNITY): Payer: Self-pay

## 2023-05-13 ENCOUNTER — Other Ambulatory Visit (HOSPITAL_COMMUNITY): Payer: Self-pay

## 2023-05-13 ENCOUNTER — Telehealth: Payer: Self-pay

## 2023-05-13 NOTE — Telephone Encounter (Signed)
    Per pts plan (additional questions) needing to be answered to continue the P/A. I have done this verbally, A determination should be made in 72hrs  Ref# [id number] Contact# [phone]

## 2023-05-17 ENCOUNTER — Other Ambulatory Visit (HOSPITAL_COMMUNITY): Payer: Self-pay

## 2023-05-17 NOTE — Telephone Encounter (Signed)
Pharmacy Patient Advocate Encounter  Received notification from [facility] that Prior Authorization for Pantoprazole 40 MG has been APPROVED from 05/13/2023 to 10/04/2023   PA #/Case ID/Reference #: [id number]

## 2023-05-31 DIAGNOSIS — H40013 Open angle with borderline findings, low risk, bilateral: Secondary | ICD-10-CM | POA: Diagnosis not present

## 2023-05-31 DIAGNOSIS — H18413 Arcus senilis, bilateral: Secondary | ICD-10-CM | POA: Diagnosis not present

## 2023-05-31 DIAGNOSIS — H18593 Other hereditary corneal dystrophies, bilateral: Secondary | ICD-10-CM | POA: Diagnosis not present

## 2023-05-31 DIAGNOSIS — Z961 Presence of intraocular lens: Secondary | ICD-10-CM | POA: Diagnosis not present

## 2023-06-07 ENCOUNTER — Other Ambulatory Visit: Payer: Self-pay | Admitting: Family Medicine

## 2023-06-07 ENCOUNTER — Encounter: Payer: Self-pay | Admitting: Cardiovascular Disease

## 2023-06-07 NOTE — Progress Notes (Signed)
Cardiology Office Note   Date:  06/08/2023   ID:  [name], [name] 08-11-1941, MRN [id number]  PCP:  Shelva Majestic, MD  Cardiologist:   [name], MD   Chief Complaint  Patient presents with   Coronary Artery Disease        1. Coronary artery disease-status post PTCA and stenting of his left circumflex artery and right coronary artery 2. Hyperlipidemia    Previous notes:  82 year old gentleman with a history of coronary artery disease. [name] [name] status post PTCA and stenting of the left circumflex artery as well as PTCA of  the posterior descending artery and January 2002. [name] [name]'s done very well. [name] [name] has not had any episodes of chest pain or shortness of breath.  January 16, 2014:  Smayan feels well - no angina, no dyspnea.    [name] [name] is watching his diet   January 21, 2015:  [name] [name] is a 82 y.o. male who presents for follow-up of his coronary artery disease. Still bowling  [facility]  Does some cardio.   January 21, 2016:  [name] is seen today for his CAD.    Has some muscle stiffness.  No muscle aches.  Has done some stretches.   January 27, 2017:  Having some muscle pull issus Having some ortho issues.  No CP  Is now changing Dr. [name] at [place].  Has stopped his Niacin .   Still goes to the [facility].  Does light cardio and some machines.   March 17, 2018:  No CP , some dyspnea.   Especially with walking . Has some thigh pain with exercise.   Does not walk regularly  [facility].   Rides a bike, walks on treadmill , works out on [employer] , seated ellipitical   Aug. 9, 2021: [name] is seen for follow up of his CAD, HLD  Still very active. Bowls regularly  No CP , no dyspnea   Labs from Jan were reviewed  Wt today is 168.  Has lost weight on purpose - 25 lbs over the past 2 years.   Sept. 6, 2022: [name] [name] is seen today for follow up of his CAD, HLD, HTN. Has had cataract surgery ,  gall bladder  surgery   No angina ,    Depressed by the passing of his wife in March   No CP, getting exercise , bowls 3 days a week   Lipid levels drawn by Dr. Durene Cal in July, 2023 reveal a total cholesterol of 102 HDL is 42.9 LDL is 50 Triglyceride level is 45.  Hemoglobin is 14.6, potassium is 4.7.  Glucose is 88.  March 22, 2023   Hemal is seen for follow up of his CAD, HLD , HTN Has been having some orthostasis ( dizziness when [name] [name] stood up while fixing the mailbox)   Rare episodes of mid chest discomfort  - does not feel like his angina  Does worsen with exertion , gets better with rest    Sept. 4, 2024 [name] [name] was havign CP at his last visit Cath on April 11, 2023 shows CTO of RCA Moderate LM disease  LAD has distal disease - to be treated medically  Focal 80% LCx disease with patent mid LCx stents  Dr. AT was not able to wire the LCx. Medical therapy was recommended.  Is not having any significant CP  Had lots of arm pain  from his radial artery attempt  Lipid levels from March 23, 2023 reveal Total cholesterol is 103 HDL is 40 LDL is 49 Triglyceride level is 61        Past Medical History:  Diagnosis Date   Anxiety    Coronary artery disease    status post PTCA and stenting of his left circumflex artery and right coronary artery   GERD (gastroesophageal reflux disease)    H/O heart artery stent    Hyperlipidemia    Hypertension    Pt denies   Reflux    omeprazole 40mg     Past Surgical History:  Procedure Laterality Date   Benign Cyst     on back   CARDIOVASCULAR STRESS TEST  05/09/2007   EF 64%   carpal tunnel left     CATARACT EXTRACTION     bilateral- april 15th with repeat 3 weeks later   CHOLECYSTECTOMY N/A 01/12/2021   Procedure: LAPAROSCOPIC CHOLECYSTECTOMY WITH INTRAOPERATIVE CHOLANGIOGRAM;  Surgeon: Darnell Level, MD;  Location: [facility];  Service: General;  Laterality: N/A;  90MIN/RM2   CORONARY ANGIOPLASTY WITH STENT PLACEMENT     CORONARY BALLOON  ANGIOPLASTY N/A 04/11/2023   Procedure: CORONARY BALLOON ANGIOPLASTY;  Surgeon: Orbie Pyo, MD;  Location: [facility];  Service: Cardiovascular;  Laterality: N/A;   CORONARY PRESSURE/FFR STUDY Left 04/11/2023   Procedure: CORONARY PRESSURE/FFR STUDY;  Surgeon: Orbie Pyo, MD;  Location: [facility];  Service: Cardiovascular;  Laterality: Left;   CYSTECTOMY     removed from finger   cystoscopy     LEFT HEART CATH AND CORONARY ANGIOGRAPHY N/A 04/11/2023   Procedure: LEFT HEART CATH AND CORONARY ANGIOGRAPHY;  Surgeon: Orbie Pyo, MD;  Location: [facility];  Service: Cardiovascular;  Laterality: N/A;   LIPOMA EXCISION     Back   right knee surgery     arthroscopyc 02/2010     Current Outpatient Medications  Medication Sig Dispense Refill   aspirin EC 81 MG tablet Take 1 tablet (81 mg total) by mouth daily. Swallow whole. 30 tablet 0   atorvastatin (LIPITOR) 40 MG tablet Take 1 tablet (40 mg total) by mouth daily. 90 tablet 3   Calcium Carbonate-Vitamin D (CALCIUM + D PO) Take 1 tablet by mouth daily.     clopidogrel (PLAVIX) 75 MG tablet Take 1 tablet (75 mg total) by mouth daily. 90 tablet 3   fluticasone (FLONASE) 50 MCG/ACT nasal spray Place 2 sprays into both nostrils daily. (Patient taking differently: Place 2 sprays into both nostrils daily as needed for allergies.) 48 g 1   glucosamine-chondroitin 500-400 MG tablet Take 1 tablet by mouth daily.     ibuprofen (ADVIL) 200 MG tablet Take 400 mg by mouth every 6 (six) hours as needed for moderate pain or mild pain.     isosorbide mononitrate (IMDUR) 30 MG 24 hr tablet Take 0.5 tablets (15 mg total) by mouth daily. 45 tablet 3   Melatonin 10 MG TABS Take 10 mg by mouth at bedtime.     metoprolol tartrate (LOPRESSOR) 25 MG tablet Take 1 tablet (25 mg total) by mouth 2 (two) times daily. 180 tablet 3   Multiple Vitamin (MULTIVITAMIN PO) Take 1 tablet by mouth daily.     nitroGLYCERIN (NITROSTAT) 0.4 MG SL  tablet Place 1 tablet (0.4 mg total) under the tongue every 5 (five) minutes as needed for chest pain. 25 tablet 6   Omega-3 Fatty Acids (FISH OIL) 1200  MG CAPS Take 1,200 mg by mouth daily.     pantoprazole (PROTONIX) 40 MG tablet Take 1 tablet (40 mg total) by mouth daily. 90 tablet 3   sildenafil (VIAGRA) 100 MG tablet Take 1 tablet (100 mg total) by mouth daily as needed for erectile dysfunction. 30 tablet 3   tamsulosin (FLOMAX) 0.4 MG CAPS capsule Take 1 capsule (0.4 mg total) by mouth daily. 90 capsule 3   traZODone (DESYREL) 50 MG tablet TAKE 1/2 TO 1 TABLET AT BEDTIME AS NEEDED FOR SLEEP 90 tablet 3   trolamine salicylate (BLUE-EMU HEMP) 10 % cream Apply 1 Application topically daily as needed for muscle pain.     No current facility-administered medications for this visit.    Allergies:   Hydrocodone and Simvastatin    Social History:  The patient  reports that [name] [name] has never smoked. [name] [name] has never used smokeless tobacco. [name] [name] reports that [name] [name] does not drink alcohol and does not use drugs.   Family History:  The patient's family history includes Alcohol abuse in his father; Bladder Cancer in his mother; Heart attack in his father.    ROS: Noted in current history, otherwise review of systems is negative.   Physical Exam: Blood pressure 108/62, pulse 78, height 5\' 8"  (1.727 m), weight 159 lb 6.4 oz (72.3 kg), SpO2 97%.       GEN:  Well nourished, well developed in no acute distress HEENT: Normal NECK: No JVD; No carotid bruits LYMPHATICS: No lymphadenopathy CARDIAC: RRR , no murmurs, rubs, gallops RESPIRATORY:  Clear to auscultation without rales, wheezing or rhonchi  ABDOMEN: Soft, non-tender, non-distended MUSCULOSKELETAL:  No edema;  right radial pulse is intact .  SKIN: Warm and dry NEUROLOGIC:  Alert and oriented x 3   EKG:         Recent Labs: 03/17/2023: TSH 0.66 04/19/2023: ALT 16; BUN 22; Creatinine, Ser 0.83; Hemoglobin 14.1; Platelets 240.0; Potassium 4.1;  Sodium 136    Lipid Panel    Component Value Date/Time   CHOL 103 03/23/2023 0720   TRIG 61 03/23/2023 0720   HDL 40 03/23/2023 0720   CHOLHDL 2.6 03/23/2023 0720   CHOLHDL 2 04/13/2022 0859   VLDL 9.0 04/13/2022 0859   LDLCALC 49 03/23/2023 0720   LDLDIRECT 58.0 04/08/2021 1140      Wt Readings from Last 3 Encounters:  06/08/23 159 lb 6.4 oz (72.3 kg)  04/26/23 161 lb 6.4 oz (73.2 kg)  04/19/23 162 lb 3.2 oz (73.6 kg)      Other studies Reviewed: Additional studies/ records that were reviewed today include: . Review of the above records demonstrates:    ASSESSMENT AND PLAN:  1. Coronary artery disease-    Cath showed significant disease in his LCx .  We unable to pass a wire so no PCI was performed.  Cont ASA and Plavix Would DC ASA if [name] [name] starts to have any bleeding   No angina currently  Cont meds      2. Hyperlipidemia -    lipids look good  Continue current       Current medicines are reviewed at length with the patient today.  The patient does not have concerns regarding medicines.  The following changes have been made:  no change  Labs/ tests ordered today include:   No orders of the defined types were placed in this encounter.     Disposition:         Signed, [name], MD  06/08/2023 10:15 AM  [facility] [address] [place], [place], [place]  [address] Phone: [phone]; Fax: [phone]

## 2023-06-08 ENCOUNTER — Encounter: Payer: Self-pay | Admitting: Cardiovascular Disease

## 2023-06-08 ENCOUNTER — Ambulatory Visit: Payer: Medicare HMO | Attending: Cardiovascular Disease | Admitting: Cardiovascular Disease

## 2023-06-08 VITALS — BP 108/62 | HR 78 | Ht 68.0 in | Wt 159.4 lb

## 2023-06-08 DIAGNOSIS — E785 Hyperlipidemia, unspecified: Secondary | ICD-10-CM

## 2023-06-08 DIAGNOSIS — I2510 Atherosclerotic heart disease of native coronary artery without angina pectoris: Secondary | ICD-10-CM | POA: Diagnosis not present

## 2023-06-08 NOTE — Patient Instructions (Signed)

## 2023-06-13 NOTE — Telephone Encounter (Signed)
Pt called and needs RX to be filled.

## 2023-07-08 DIAGNOSIS — N401 Enlarged prostate with lower urinary tract symptoms: Secondary | ICD-10-CM | POA: Diagnosis not present

## 2023-07-08 DIAGNOSIS — R351 Nocturia: Secondary | ICD-10-CM | POA: Diagnosis not present

## 2023-07-08 DIAGNOSIS — N35816 Other urethral stricture, male, overlapping sites: Secondary | ICD-10-CM | POA: Diagnosis not present

## 2023-07-15 DIAGNOSIS — M1711 Unilateral primary osteoarthritis, right knee: Secondary | ICD-10-CM | POA: Diagnosis not present

## 2023-07-15 DIAGNOSIS — M7062 Trochanteric bursitis, left hip: Secondary | ICD-10-CM | POA: Diagnosis not present

## 2023-07-22 DIAGNOSIS — M1711 Unilateral primary osteoarthritis, right knee: Secondary | ICD-10-CM | POA: Diagnosis not present

## 2023-07-28 DIAGNOSIS — M1711 Unilateral primary osteoarthritis, right knee: Secondary | ICD-10-CM | POA: Diagnosis not present

## 2023-08-04 ENCOUNTER — Other Ambulatory Visit: Payer: Self-pay | Admitting: Urology

## 2023-08-04 ENCOUNTER — Telehealth: Payer: Self-pay

## 2023-08-04 NOTE — Telephone Encounter (Signed)
Dr. Elease Hashimoto,  You saw this patient on 06/08/2023. Per office protocol, will you please comment on medical clearance for cystoscopy with urethral dilation and transurethral resection of the prostate on 08/16/2023? Request includes holding Plavix and aspirin.   Please route your response to P CV DIV Preop. I will communicate with requesting office once you have given recommendations.   Thank you!  Carlos Levering, NP

## 2023-08-04 NOTE — Telephone Encounter (Signed)
   Pre-operative Risk Assessment    Patient Name: [name] [name]  DOB: 11-12-1940 MRN: [id number]  Last ov 06/08/2023  Upcoming visit: Unknown       Request for Surgical Clearance    Procedure:   Cysto, urethral dilation and transurethral resection of the prostate   Date of Surgery:  Clearance 08/16/23                                 Surgeon:  Dr. Rhoderick Moody  Surgeon's Group or Practice Name:  [facility]  Phone number:  [phone] Fax number:  [phone]   Type of Clearance Requested:   - Medical  - Pharmacy:  Hold Aspirin and Clopidogrel (Plavix) Aspirin 5 days prior to surgery and Plavix not indicated    Type of Anesthesia:  Not Indicated   Additional requests/questions:    [name]   08/04/2023, 10:43 AM

## 2023-08-05 NOTE — Telephone Encounter (Signed)
   Name: [name] [name]  DOB: [birth year]  MRN: [id number]   Primary Cardiologist: [name], MD  Chart reviewed as part of pre-operative protocol coverage. [name] [name] was last seen on 06/08/2023 by Dr. Elease Hashimoto.  Per Dr. Elease Hashimoto "Kalev is at low risk for his cystoscopy procedure. He may hold his ASA and Plavix for 5 days prior to the procedure."  Therefore, based on ACC/AHA guidelines, the patient would be at acceptable risk for the planned procedure without further cardiovascular testing.   Per office protocol, he may hold Plavix and aspirin for 5 days prior to procedure and should resume as soon as hemodynamically stable postoperatively.   I will route this recommendation to the requesting party via Epic fax function and remove from pre-op pool. Please call with questions.  Carlos Levering, NP 08/05/2023, 8:57 AM

## 2023-08-05 NOTE — Progress Notes (Signed)
Sent message, via epic in basket, requesting orders in epic from surgeon.  

## 2023-08-08 NOTE — Progress Notes (Addendum)
Anesthesia Review:  PCP: [name]- LOV 03/17/23 Cardiologist : DR [name] LOV 06/08/23  [name] Levering, NP Telephone encounter 08/04/23- clearance  Chest x-ray : EKG : 04/13/2023  Echo : Stress test: 04/04/23  Cardiac Cath :  04/11/23  Activity level: can do a flight of stairs without difficutly  Sleep Study/ CPAP : none  Fasting Blood Sugar :      / Checks Blood Sugar -- times a day:   Blood Thinner/ Instructions /Last Dose: ASA / Instructions/ Last Dose :    81 mg aspirin - last dose on 08/08/23 per pt  Plavix - last dose on 08/08/23 per pt    Urine culture done 08/09/23 routed to DR Cristal Deer Winter on 08/10/23.

## 2023-08-08 NOTE — Patient Instructions (Signed)
SURGICAL WAITING ROOM VISITATION  Patients having surgery or a procedure may have no more than 2 support people in the waiting area - these visitors may rotate.    Children under the age of 55 must have an adult with them who is not the patient.  Due to an increase in RSV and influenza rates and associated hospitalizations, children ages 55 and under may not visit patients in [facility] hospitals.  If the patient needs to stay at the hospital during part of their recovery, the visitor guidelines for inpatient rooms apply. Pre-op nurse will coordinate an appropriate time for 1 support person to accompany patient in pre-op.  This support person may not rotate.    Please refer to the [facility] website for the visitor guidelines for Inpatients (after your surgery is over and you are in a regular room).       Your procedure is scheduled on:  08/16/2023    Report to [facility] Main Entrance    Report to admitting at   [phone]   Call this number if you have problems the morning of surgery [phone]   Do not eat food  or drink liquids after midnite.                 If you have questions, please contact your surgeon's office.        Oral Hygiene is also important to reduce your risk of infection.                                    Remember - BRUSH YOUR TEETH THE MORNING OF SURGERY WITH YOUR REGULAR TOOTHPASTE  DENTURES WILL BE REMOVED PRIOR TO SURGERY PLEASE DO NOT APPLY "Poly grip" OR ADHESIVES!!!   Do NOT smoke after Midnight   Stop all vitamins and herbal supplements 7 days before surgery.   Take these medicines the morning of surgery with A SIP OF WATER:  imdur, metoprolol, protonix, flomax   DO NOT TAKE ANY ORAL DIABETIC MEDICATIONS DAY OF YOUR SURGERY  Bring CPAP mask and tubing day of surgery.                              You may not have any metal on your body including hair pins, jewelry, and body piercing             Do not wear make-up, lotions,  powders, perfumes/cologne, or deodorant  Do not wear nail polish including gel and S&S, artificial/acrylic nails, or any other type of covering on natural nails including finger and toenails. If you have artificial nails, gel coating, etc. that needs to be removed by a nail salon please have this removed prior to surgery or surgery may need to be canceled/ delayed if the surgeon/ anesthesia feels like they are unable to be safely monitored.   Do not shave  48 hours prior to surgery.               Men may shave face and neck.   Do not bring valuables to the hospital. [facility] IS NOT             RESPONSIBLE   FOR VALUABLES.   Contacts, glasses, dentures or bridgework may not be worn into surgery.   Bring small overnight bag day of surgery.   DO NOT [facility] HOME MEDICATIONS  TO THE HOSPITAL. PHARMACY WILL DISPENSE MEDICATIONS LISTED ON YOUR MEDICATION LIST TO YOU DURING YOUR ADMISSION IN THE HOSPITAL!    Patients discharged on the day of surgery will not be allowed to drive home.  Someone NEEDS to stay with you for the first 24 hours after anesthesia.   Special Instructions: Bring a copy of your healthcare power of attorney and living will documents the day of surgery if you haven't scanned them before.              Please read over the following fact sheets you were given: IF YOU HAVE QUESTIONS ABOUT YOUR PRE-OP INSTRUCTIONS PLEASE CALL [phone]   If you received a COVID test during your pre-op visit  it is requested that you wear a mask when out in public, stay away from anyone that may not be feeling well and notify your surgeon if you develop symptoms. If you test positive for Covid or have been in contact with anyone that has tested positive in the last 10 days please notify you surgeon.     - Preparing for Surgery Before surgery, you can play an important role.  Because skin is not sterile, your skin needs to be as free of germs as possible.  You can reduce the  number of germs on your skin by washing with CHG (chlorahexidine gluconate) soap before surgery.  CHG is an antiseptic cleaner which kills germs and bonds with the skin to continue killing germs even after washing. Please DO NOT use if you have an allergy to CHG or antibacterial soaps.  If your skin becomes reddened/irritated stop using the CHG and inform your nurse when you arrive at Short Stay. Do not shave (including legs and underarms) for at least 48 hours prior to the first CHG shower.  You may shave your face/neck. Please follow these instructions carefully:  1.  Shower with CHG Soap the night before surgery and the  morning of Surgery.  2.  If you choose to wash your hair, wash your hair first as usual with your  normal  shampoo.  3.  After you shampoo, rinse your hair and body thoroughly to remove the  shampoo.                           4.  Use CHG as you would any other liquid soap.  You can apply chg directly  to the skin and wash                       Gently with a scrungie or clean washcloth.  5.  Apply the CHG Soap to your body ONLY FROM THE NECK DOWN.   Do not use on face/ open                           Wound or open sores. Avoid contact with eyes, ears mouth and genitals (private parts).                       Wash face,  Genitals (private parts) with your normal soap.             6.  Wash thoroughly, paying special attention to the area where your surgery  will be performed.  7.  Thoroughly rinse your body with warm water from the neck down.  8.  DO NOT shower/wash with your  normal soap after using and rinsing off  the CHG Soap.                9.  Pat yourself dry with a clean towel.            10.  Wear clean pajamas.            11.  Place clean sheets on your bed the night of your first shower and do not  sleep with pets. Day of Surgery : Do not apply any lotions/deodorants the morning of surgery.  Please wear clean clothes to the hospital/surgery center.  FAILURE TO FOLLOW  THESE INSTRUCTIONS MAY RESULT IN THE CANCELLATION OF YOUR SURGERY PATIENT SIGNATURE_________________________________  NURSE SIGNATURE__________________________________  ________________________________________________________________________

## 2023-08-09 ENCOUNTER — Encounter (HOSPITAL_COMMUNITY): Payer: Self-pay

## 2023-08-09 ENCOUNTER — Telehealth: Payer: Self-pay | Admitting: Family Medicine

## 2023-08-09 ENCOUNTER — Other Ambulatory Visit: Payer: Self-pay

## 2023-08-09 ENCOUNTER — Encounter (HOSPITAL_COMMUNITY)
Admission: RE | Admit: 2023-08-09 | Discharge: 2023-08-09 | Disposition: A | Discharge status: Home / Self Care | Payer: Medicare HMO | Source: Ambulatory Visit | Attending: Urology | Admitting: Urology

## 2023-08-09 VITALS — BP 137/79 | HR 69 | Temp 97.7°F | Resp 16 | Ht 68.0 in | Wt 164.0 lb

## 2023-08-09 DIAGNOSIS — N400 Enlarged prostate without lower urinary tract symptoms: Secondary | ICD-10-CM | POA: Insufficient documentation

## 2023-08-09 DIAGNOSIS — N35919 Unspecified urethral stricture, male, unspecified site: Secondary | ICD-10-CM | POA: Insufficient documentation

## 2023-08-09 DIAGNOSIS — Z01818 Encounter for other preprocedural examination: Secondary | ICD-10-CM

## 2023-08-09 DIAGNOSIS — Z01812 Encounter for preprocedural laboratory examination: Secondary | ICD-10-CM | POA: Diagnosis not present

## 2023-08-09 DIAGNOSIS — I10 Essential (primary) hypertension: Secondary | ICD-10-CM | POA: Diagnosis not present

## 2023-08-09 DIAGNOSIS — I2510 Atherosclerotic heart disease of native coronary artery without angina pectoris: Secondary | ICD-10-CM | POA: Diagnosis not present

## 2023-08-09 HISTORY — DX: Unspecified osteoarthritis, unspecified site: M19.90

## 2023-08-09 LAB — CBC
HCT: 41.9 % (ref 39.0–52.0)
Hemoglobin: 14.3 g/dL (ref 13.0–17.0)
MCH: 31.3 pg (ref 26.0–34.0)
MCHC: 34.1 g/dL (ref 30.0–36.0)
MCV: 91.7 fL (ref 80.0–100.0)
Platelets: 191 10*3/uL (ref 150–400)
RBC: 4.57 MIL/uL (ref 4.22–5.81)
RDW: 13.2 % (ref 11.5–15.5)
WBC: 6.7 10*3/uL (ref 4.0–10.5)
nRBC: 0 % (ref 0.0–0.2)

## 2023-08-09 LAB — URINALYSIS, ROUTINE W REFLEX MICROSCOPIC
Bilirubin Urine: NEGATIVE
Glucose, UA: NEGATIVE mg/dL
Hgb urine dipstick: NEGATIVE
Ketones, ur: NEGATIVE mg/dL
Leukocytes,Ua: NEGATIVE
Nitrite: NEGATIVE
Protein, ur: NEGATIVE mg/dL
Specific Gravity, Urine: 1.015 (ref 1.005–1.030)
pH: 7 (ref 5.0–8.0)

## 2023-08-09 LAB — BASIC METABOLIC PANEL
Anion gap: 7 (ref 5–15)
BUN: 17 mg/dL (ref 8–23)
CO2: 25 mmol/L (ref 22–32)
Calcium: 9.2 mg/dL (ref 8.9–10.3)
Chloride: 105 mmol/L (ref 98–111)
Creatinine, Ser: 0.69 mg/dL (ref 0.61–1.24)
GFR, Estimated: >60 mL/min (ref >60)
Glucose, Bld: 99 mg/dL (ref 70–99)
Potassium: 4.4 mmol/L (ref 3.5–5.1)
Sodium: 137 mmol/L (ref 135–145)

## 2023-08-09 NOTE — Telephone Encounter (Signed)
Vaccines documented.  

## 2023-08-09 NOTE — Telephone Encounter (Signed)
Please tell him thank you for the update and I wish him the best with his surgery-hope it helps with his symptoms

## 2023-08-09 NOTE — Telephone Encounter (Signed)
Patient wanted to let PCP know that he has had updated flu and covid vaccine on 07/05/23 @[facility]. He is also wanted to let PCP know he is having surgery on 11/12, a Transurethral resection of the prostate.

## 2023-08-10 LAB — URINE CULTURE: Culture: <10000 — AB

## 2023-08-10 NOTE — Anesthesia Preprocedure Evaluation (Addendum)
Anesthesia Evaluation  Patient identified by MRN, date of birth, ID band Patient awake    Reviewed: Allergy & Precautions, NPO status , Patient's Chart, lab work & pertinent test results, reviewed documented beta blocker date and time   Airway Mallampati: II       Dental no notable dental hx. (+) Caps, Dental Advisory Given   Pulmonary neg pulmonary ROS   Pulmonary exam normal breath sounds clear to auscultation       Cardiovascular hypertension, Pt. on medications and Pt. on home beta blockers + CAD and + Cardiac Stents  Normal cardiovascular exam Rhythm:Regular Rate:Normal  Stents x 2 L Cx and RCA  Cardiac cath 04/11/23  Prox RCA lesion is 100% stenosed.   Prox Cx to Mid Cx lesion is 20% stenosed.   Prox Cx lesion is 80% stenosed.   Dist LAD lesion is 99% stenosed.   Mid LM lesion is 60% stenosed.   Post intervention, there is a 80% residual stenosis.   Nuclear stress test 04/04/23   Lexiscan stress shows no EKG changes   Myoview scan shows a large defect in the inferior wall (base, mid, distal), inferolateral wall (base, mid, distal) inferoseptal wall (base) and apex.  No change in the rest images   Consistent with large area of scar and possible soft tissue attenuation (diaphragm)  No ischemia.   Nuclear stress EF: 48 %.   COmpared to images from scan from 05/09/2007, these changes are new       Neuro/Psych   Anxiety     negative neurological ROS     GI/Hepatic Neg liver ROS,GERD  Medicated,,  Endo/Other  Hyperlipidemia  Renal/GU negative Renal ROS   BPH Urethral stricture    Musculoskeletal  (+) Arthritis , Osteoarthritis,    Abdominal   Peds  Hematology Plavix therapy- Last dose   Anesthesia Other Findings   Reproductive/Obstetrics                              Anesthesia Physical Anesthesia Plan  ASA: 3  Anesthesia Plan: General   Post-op Pain Management: Minimal  or no pain anticipated   Induction: Intravenous  PONV Risk Score and Plan: 4 or greater and Treatment may vary due to age or medical condition, Ondansetron and Dexamethasone  Airway Management Planned: LMA  Additional Equipment: None  Intra-op Plan:   Post-operative Plan: Extubation in OR  Informed Consent: I have reviewed the patients History and Physical, chart, labs and discussed the procedure including the risks, benefits and alternatives for the proposed anesthesia with the patient or authorized representative who has indicated his/her understanding and acceptance.     Dental advisory given  Plan Discussed with: CRNA and Anesthesiologist  Anesthesia Plan Comments: (See PAT note 08/09/2023)        Anesthesia Quick Evaluation

## 2023-08-10 NOTE — Progress Notes (Signed)
Anesthesia Chart Review   Case: [id number] Date/Time: 08/16/23 0715   Procedures:      TRANSURETHRAL RESECTION OF THE PROSTATE (TURP) - 90 MINUTES     CYSTOSCOPY WITH URETHRAL DILATATION   Anesthesia type: General   Pre-op diagnosis: BENIGN PROSTATIC HYPERPLASIA AND URETHRAL STRICTURE   Location: [facility] ROOM 04 / [facility]   Surgeons: Rene Paci, MD       DISCUSSION:82 y.o. never smoker with h/o HTN, CAD s/p stent, BPH and urethral stricture scheduled for above procedure 08/16/23 with Dr. Cristal Deer Liliane Shi.   Per cardiology preoperative evaluation 08/05/2023, "Chart reviewed as part of pre-operative protocol coverage. [name] [name] was last seen on 06/08/2023 by Dr. Elease Hashimoto.  Per Dr. Elease Hashimoto "[name] [name] is at low risk for his cystoscopy procedure. He may hold his ASA and Plavix for 5 days prior to the procedure."   Therefore, based on ACC/AHA guidelines, the patient would be at acceptable risk for the planned procedure without further cardiovascular testing.    Per office protocol, he may hold Plavix and aspirin for 5 days prior to procedure and should resume as soon as hemodynamically stable postoperatively."  VS: BP 137/79   Pulse 69   Temp 36.5 C (Oral)   Resp 16   Ht 5\' 8"  (1.727 m)   Wt 74.4 kg   SpO2 100%   BMI 24.94 kg/m   PROVIDERS: Shelva Majestic, MD is PCP   Laqueta Carina, MD is Cardiologist  LABS: Labs reviewed: Acceptable for surgery. (all labs ordered are listed, but only abnormal results are displayed)  Labs Reviewed  URINE CULTURE  URINALYSIS, ROUTINE W REFLEX MICROSCOPIC  CBC  BASIC METABOLIC PANEL     IMAGES:   EKG:   CV: Cardiac Cath 04/11/2023   Prox RCA lesion is 100% stenosed.   Prox Cx to Mid Cx lesion is 20% stenosed.   Prox Cx lesion is 80% stenosed.   Dist LAD lesion is 99% stenosed.   Mid LM lesion is 60% stenosed.   Post intervention, there is a 80% residual stenosis.   1.  Very calcified right radial artery unable to  accommodate sheath. 2.  Chronic total occlusion of proximal right coronary artery collateralized by the left circumflex. 3.  Moderate left main disease with an associated RFR value of 0.91 with moderate diffuse distal dilute disease of the LAD; the distal LAD should be treated medically and does not appear to be a good target for surgical revascularization. 4.  Focal 80% proximal left circumflex lesion with patent mid left circumflex stents.  The associated RFR was 0.71.  Despite multiple wires and a guide liner, the angulation from the left main/LAD into the circumflex could not be negotiated.  Therefore PCI was deferred and optimal medical therapy should be pursued. 5.  LVEDP of 9 mmHg.   Recommendation: Optimal medical therapy. Past Medical History:  Diagnosis Date   Anxiety    Arthritis    Coronary artery disease    status post PTCA and stenting of his left circumflex artery and right coronary artery   GERD (gastroesophageal reflux disease)    H/O heart artery stent    Hyperlipidemia    Hypertension    Pt denies   Reflux    omeprazole 40mg     Past Surgical History:  Procedure Laterality Date   Benign Cyst     on back   CARDIOVASCULAR STRESS TEST  05/09/2007   EF 64%   carpal tunnel left  CATARACT EXTRACTION     bilateral- april 15th with repeat 3 weeks later   CHOLECYSTECTOMY N/A 01/12/2021   Procedure: LAPAROSCOPIC CHOLECYSTECTOMY WITH INTRAOPERATIVE CHOLANGIOGRAM;  Surgeon: Darnell Level, MD;  Location: [facility];  Service: General;  Laterality: N/A;  90MIN/RM2   CORONARY ANGIOPLASTY WITH STENT PLACEMENT     CORONARY BALLOON ANGIOPLASTY N/A 04/11/2023   Procedure: CORONARY BALLOON ANGIOPLASTY;  Surgeon: Orbie Pyo, MD;  [facility];  Service: Cardiovascular;  Laterality: N/A;   CORONARY PRESSURE/FFR STUDY Left 04/11/2023   Procedure: CORONARY PRESSURE/FFR STUDY;  Surgeon: Orbie Pyo, MD;  [facility]: [facility];  Service: Cardiovascular;   Laterality: Left;   CYSTECTOMY     removed from finger   cystoscopy     LEFT HEART CATH AND CORONARY ANGIOGRAPHY N/A 04/11/2023   Procedure: LEFT HEART CATH AND CORONARY ANGIOGRAPHY;  Surgeon: Orbie Pyo, MD;  [facility];  Service: Cardiovascular;  Laterality: N/A;   LIPOMA EXCISION     Back   right knee surgery     arthroscopyc 02/2010    MEDICATIONS:  aspirin EC 81 MG tablet   atorvastatin (LIPITOR) 40 MG tablet   Calcium Carbonate-Vitamin D (CALCIUM + D PO)   clopidogrel (PLAVIX) 75 MG tablet   fluticasone (FLONASE) 50 MCG/ACT nasal spray   glucosamine-chondroitin 500-400 MG tablet   ibuprofen (ADVIL) 200 MG tablet   isosorbide mononitrate (IMDUR) 30 MG 24 hr tablet   Melatonin 10 MG TABS   metoprolol tartrate (LOPRESSOR) 25 MG tablet   Multiple Vitamin (MULTIVITAMIN PO)   nitroGLYCERIN (NITROSTAT) 0.4 MG SL tablet   Omega-3 Fatty Acids (FISH OIL) 1200 MG CAPS   pantoprazole (PROTONIX) 40 MG tablet   sildenafil (VIAGRA) 100 MG tablet   tamsulosin (FLOMAX) 0.4 MG CAPS capsule   traZODone (DESYREL) 50 MG tablet   trolamine salicylate (BLUE-EMU HEMP) 10 % cream   No current facility-administered medications for this encounter.     Jodell Cipro Ward, PA-C [facility] [phone]

## 2023-08-15 ENCOUNTER — Encounter (HOSPITAL_COMMUNITY): Payer: Self-pay | Admitting: Urology

## 2023-08-16 ENCOUNTER — Observation Stay (HOSPITAL_COMMUNITY)
Admission: RE | Admit: 2023-08-16 | Discharge: 2023-08-17 | Disposition: A | Discharge status: Home / Self Care | Payer: Medicare HMO | Attending: Urology | Admitting: Urology

## 2023-08-16 ENCOUNTER — Ambulatory Visit (HOSPITAL_BASED_OUTPATIENT_CLINIC_OR_DEPARTMENT_OTHER): Payer: Medicare HMO | Admitting: Anesthesiology

## 2023-08-16 ENCOUNTER — Encounter (HOSPITAL_COMMUNITY)
Admission: RE | Disposition: A | Discharge status: Home / Self Care | Payer: Self-pay | Source: Home / Self Care | Attending: Urology

## 2023-08-16 ENCOUNTER — Encounter (HOSPITAL_COMMUNITY): Payer: Self-pay | Admitting: Urology

## 2023-08-16 ENCOUNTER — Ambulatory Visit (HOSPITAL_COMMUNITY): Payer: Medicare HMO | Admitting: Physician Assistant

## 2023-08-16 ENCOUNTER — Other Ambulatory Visit: Payer: Self-pay

## 2023-08-16 DIAGNOSIS — N401 Enlarged prostate with lower urinary tract symptoms: Secondary | ICD-10-CM

## 2023-08-16 DIAGNOSIS — N400 Enlarged prostate without lower urinary tract symptoms: Secondary | ICD-10-CM | POA: Diagnosis not present

## 2023-08-16 DIAGNOSIS — C61 Malignant neoplasm of prostate: Secondary | ICD-10-CM | POA: Diagnosis not present

## 2023-08-16 DIAGNOSIS — N35919 Unspecified urethral stricture, male, unspecified site: Secondary | ICD-10-CM | POA: Diagnosis not present

## 2023-08-16 DIAGNOSIS — I10 Essential (primary) hypertension: Secondary | ICD-10-CM | POA: Insufficient documentation

## 2023-08-16 DIAGNOSIS — N35912 Unspecified bulbous urethral stricture, male: Secondary | ICD-10-CM | POA: Diagnosis not present

## 2023-08-16 DIAGNOSIS — E785 Hyperlipidemia, unspecified: Secondary | ICD-10-CM | POA: Diagnosis not present

## 2023-08-16 DIAGNOSIS — I2510 Atherosclerotic heart disease of native coronary artery without angina pectoris: Secondary | ICD-10-CM | POA: Diagnosis not present

## 2023-08-16 DIAGNOSIS — N411 Chronic prostatitis: Secondary | ICD-10-CM | POA: Diagnosis not present

## 2023-08-16 DIAGNOSIS — Z955 Presence of coronary angioplasty implant and graft: Secondary | ICD-10-CM | POA: Diagnosis not present

## 2023-08-16 DIAGNOSIS — N35011 Post-traumatic bulbous urethral stricture: Secondary | ICD-10-CM | POA: Diagnosis not present

## 2023-08-16 DIAGNOSIS — Z01818 Encounter for other preprocedural examination: Secondary | ICD-10-CM

## 2023-08-16 DIAGNOSIS — N410 Acute prostatitis: Secondary | ICD-10-CM | POA: Diagnosis not present

## 2023-08-16 DIAGNOSIS — R3912 Poor urinary stream: Secondary | ICD-10-CM | POA: Diagnosis not present

## 2023-08-16 DIAGNOSIS — N138 Other obstructive and reflux uropathy: Principal | ICD-10-CM | POA: Diagnosis present

## 2023-08-16 HISTORY — PX: TRANSURETHRAL RESECTION OF PROSTATE: SHX73

## 2023-08-16 HISTORY — PX: CYSTOSCOPY WITH URETHRAL DILATATION: SHX5125

## 2023-08-16 SURGERY — TURP (TRANSURETHRAL RESECTION OF PROSTATE)
Anesthesia: General | Site: Urethra

## 2023-08-16 MED ORDER — ISOSORBIDE MONONITRATE ER 30 MG PO TB24
15.0000 mg | ORAL_TABLET | Freq: Every day | ORAL | Status: DC
  Administered 2023-08-17: 15 mg via ORAL
  Filled 2023-08-16: qty 1

## 2023-08-16 MED ORDER — LACTATED RINGERS IV SOLN: INTRAVENOUS | Status: DC

## 2023-08-16 MED ORDER — FENTANYL CITRATE (PF) 100 MCG/2ML IJ SOLN
INTRAMUSCULAR | Status: DC | PRN
  Administered 2023-08-16: 100 ug via INTRAVENOUS

## 2023-08-16 MED ORDER — FENTANYL CITRATE PF 50 MCG/ML IJ SOSY
25.0000 ug | PREFILLED_SYRINGE | INTRAMUSCULAR | Status: DC | PRN
  Administered 2023-08-16: 50 ug via INTRAVENOUS

## 2023-08-16 MED ORDER — PROPOFOL 10 MG/ML IV BOLUS
INTRAVENOUS | Status: AC
  Filled 2023-08-16: qty 20

## 2023-08-16 MED ORDER — MORPHINE SULFATE (PF) 2 MG/ML IV SOLN: 2.0000 mg | INTRAVENOUS | Status: DC | PRN

## 2023-08-16 MED ORDER — MELATONIN 5 MG PO TABS
10.0000 mg | ORAL_TABLET | Freq: Every day | ORAL | Status: DC
  Administered 2023-08-16: 10 mg via ORAL
  Filled 2023-08-16: qty 2

## 2023-08-16 MED ORDER — OXYCODONE HCL 5 MG PO TABS
ORAL_TABLET | ORAL | Status: AC
  Filled 2023-08-16: qty 1

## 2023-08-16 MED ORDER — ONDANSETRON HCL 4 MG/2ML IJ SOLN: 4.0000 mg | Freq: Once | INTRAMUSCULAR | Status: DC | PRN

## 2023-08-16 MED ORDER — DEXAMETHASONE SODIUM PHOSPHATE 10 MG/ML IJ SOLN
INTRAMUSCULAR | Status: DC | PRN
  Administered 2023-08-16: 10 mg via INTRAVENOUS

## 2023-08-16 MED ORDER — CEFAZOLIN SODIUM-DEXTROSE 2-4 GM/100ML-% IV SOLN
2.0000 g | INTRAVENOUS | Status: AC
Start: 2023-08-16 — End: 2023-08-16
  Administered 2023-08-16: 2 g via INTRAVENOUS
  Filled 2023-08-16: qty 100

## 2023-08-16 MED ORDER — CHLORHEXIDINE GLUCONATE 0.12 % MT SOLN
15.0000 mL | Freq: Once | OROMUCOSAL | Status: AC
  Administered 2023-08-16: 15 mL via OROMUCOSAL

## 2023-08-16 MED ORDER — SODIUM CHLORIDE 0.9 % IR SOLN
3000.0000 mL | Status: DC
  Administered 2023-08-16 – 2023-08-17 (×4): 3000 mL

## 2023-08-16 MED ORDER — PANTOPRAZOLE SODIUM 40 MG PO TBEC
40.0000 mg | DELAYED_RELEASE_TABLET | Freq: Every day | ORAL | Status: DC
  Administered 2023-08-17: 40 mg via ORAL
  Filled 2023-08-16: qty 1

## 2023-08-16 MED ORDER — FENTANYL CITRATE PF 50 MCG/ML IJ SOSY
PREFILLED_SYRINGE | INTRAMUSCULAR | Status: AC
  Administered 2023-08-16: 50 ug via INTRAVENOUS
  Filled 2023-08-16: qty 2

## 2023-08-16 MED ORDER — PROPOFOL 10 MG/ML IV BOLUS
INTRAVENOUS | Status: DC | PRN
  Administered 2023-08-16: 110 mg via INTRAVENOUS

## 2023-08-16 MED ORDER — ONDANSETRON HCL 4 MG/2ML IJ SOLN
INTRAMUSCULAR | Status: DC | PRN
  Administered 2023-08-16: 4 mg via INTRAVENOUS

## 2023-08-16 MED ORDER — CEPHALEXIN 500 MG PO CAPS
500.0000 mg | ORAL_CAPSULE | Freq: Three times a day (TID) | ORAL | Status: DC
  Administered 2023-08-16 – 2023-08-17 (×4): 500 mg via ORAL
  Filled 2023-08-16 (×4): qty 1

## 2023-08-16 MED ORDER — NITROGLYCERIN 0.4 MG SL SUBL: 0.4000 mg | SUBLINGUAL_TABLET | SUBLINGUAL | Status: DC | PRN

## 2023-08-16 MED ORDER — DIPHENHYDRAMINE HCL 50 MG/ML IJ SOLN: 12.5000 mg | Freq: Four times a day (QID) | INTRAMUSCULAR | Status: DC | PRN

## 2023-08-16 MED ORDER — METOPROLOL TARTRATE 25 MG PO TABS
25.0000 mg | ORAL_TABLET | Freq: Two times a day (BID) | ORAL | Status: DC
  Administered 2023-08-16 – 2023-08-17 (×2): 25 mg via ORAL
  Filled 2023-08-16 (×2): qty 1

## 2023-08-16 MED ORDER — STERILE WATER FOR IRRIGATION IR SOLN
Status: DC | PRN
  Administered 2023-08-16: 500 mL

## 2023-08-16 MED ORDER — TRAZODONE HCL 50 MG PO TABS: 50.0000 mg | ORAL_TABLET | Freq: Every evening | ORAL | Status: DC | PRN

## 2023-08-16 MED ORDER — OXYCODONE HCL 5 MG/5ML PO SOLN: 5.0000 mg | Freq: Once | ORAL | Status: AC | PRN

## 2023-08-16 MED ORDER — DIPHENHYDRAMINE HCL 12.5 MG/5ML PO ELIX: 12.5000 mg | ORAL_SOLUTION | Freq: Four times a day (QID) | ORAL | Status: DC | PRN

## 2023-08-16 MED ORDER — SODIUM CHLORIDE 0.9 % IR SOLN
Status: DC | PRN
  Administered 2023-08-16 (×3): 3000 mL

## 2023-08-16 MED ORDER — FENTANYL CITRATE (PF) 100 MCG/2ML IJ SOLN
INTRAMUSCULAR | Status: AC
  Filled 2023-08-16: qty 2

## 2023-08-16 MED ORDER — OXYBUTYNIN CHLORIDE 5 MG PO TABS: 5.0000 mg | ORAL_TABLET | Freq: Three times a day (TID) | ORAL | Status: DC | PRN

## 2023-08-16 MED ORDER — ACETAMINOPHEN 325 MG PO TABS: 650.0000 mg | ORAL_TABLET | ORAL | Status: DC | PRN

## 2023-08-16 MED ORDER — OXYCODONE-ACETAMINOPHEN 5-325 MG PO TABS
1.0000 | ORAL_TABLET | ORAL | Status: DC | PRN
  Administered 2023-08-16 – 2023-08-17 (×2): 1 via ORAL
  Filled 2023-08-16: qty 1
  Filled 2023-08-16: qty 2

## 2023-08-16 MED ORDER — ATORVASTATIN CALCIUM 40 MG PO TABS
40.0000 mg | ORAL_TABLET | Freq: Every day | ORAL | Status: DC
  Administered 2023-08-16 – 2023-08-17 (×2): 40 mg via ORAL
  Filled 2023-08-16 (×2): qty 1

## 2023-08-16 MED ORDER — OXYCODONE HCL 5 MG PO TABS
5.0000 mg | ORAL_TABLET | Freq: Once | ORAL | Status: AC | PRN
  Administered 2023-08-16: 5 mg via ORAL

## 2023-08-16 MED ORDER — PHENYLEPHRINE 80 MCG/ML (10ML) SYRINGE FOR IV PUSH (FOR BLOOD PRESSURE SUPPORT)
PREFILLED_SYRINGE | INTRAVENOUS | Status: DC | PRN
  Administered 2023-08-16: 160 ug via INTRAVENOUS
  Administered 2023-08-16: 80 ug via INTRAVENOUS

## 2023-08-16 MED ORDER — ORAL CARE MOUTH RINSE: 15.0000 mL | Freq: Once | OROMUCOSAL | Status: AC

## 2023-08-16 MED ORDER — ONDANSETRON HCL 4 MG/2ML IJ SOLN
4.0000 mg | INTRAMUSCULAR | Status: DC | PRN
Start: 2023-08-16 — End: 2023-08-17

## 2023-08-16 MED ORDER — LIDOCAINE 2% (20 MG/ML) 5 ML SYRINGE
INTRAMUSCULAR | Status: DC | PRN
  Administered 2023-08-16: 80 mg via INTRAVENOUS

## 2023-08-16 SURGICAL SUPPLY — 32 items
BAG DRN RND TRDRP ANRFLXCHMBR (UROLOGICAL SUPPLIES) ×1
BAG URINE DRAIN 2000ML AR STRL (UROLOGICAL SUPPLIES) ×1 IMPLANT
BAG URO CATCHER STRL LF (MISCELLANEOUS) ×1 IMPLANT
BALLN NEPHROSTOMY (BALLOONS)
BALLOON NEPHROSTOMY (BALLOONS) IMPLANT
CATH FOLEY 2W COUNCIL 20FR 5CC (CATHETERS) IMPLANT
CATH FOLEY 3WAY 30CC 22FR (CATHETERS) IMPLANT
CATH HEMA 3WAY 30CC 24FR COUDE (CATHETERS) IMPLANT
CATH ROBINSON RED A/P 14FR (CATHETERS) ×1 IMPLANT
CATH URET 5FR 70CM CONE TIP (BALLOONS) IMPLANT
CATH URETL OPEN 5X70 (CATHETERS) IMPLANT
CLOTH BEACON ORANGE TIMEOUT ST (SAFETY) ×1 IMPLANT
DRAPE FOOT SWITCH (DRAPES) ×1 IMPLANT
GLOVE BIO SURGEON STRL SZ7.5 (GLOVE) ×1 IMPLANT
GLOVE SURG LX STRL 7.5 STRW (GLOVE) ×1 IMPLANT
GOWN STRL REUS W/ TWL XL LVL3 (GOWN DISPOSABLE) ×1 IMPLANT
GOWN STRL REUS W/TWL XL LVL3 (GOWN DISPOSABLE) ×1
GUIDEWIRE ANG ZIPWIRE 038X150 (WIRE) IMPLANT
GUIDEWIRE STR DUAL SENSOR (WIRE) ×1 IMPLANT
HOLDER FOLEY CATH W/STRAP (MISCELLANEOUS) IMPLANT
KIT TURNOVER KIT A (KITS) IMPLANT
LOOP CUT BIPOLAR 24F LRG (ELECTROSURGICAL) IMPLANT
MANIFOLD NEPTUNE II (INSTRUMENTS) ×1 IMPLANT
NS IRRIG 1000ML POUR BTL (IV SOLUTION) IMPLANT
PACK CYSTO (CUSTOM PROCEDURE TRAY) ×1 IMPLANT
PAD PREP 24X48 CUFFED NSTRL (MISCELLANEOUS) ×1 IMPLANT
PENCIL SMOKE EVACUATOR (MISCELLANEOUS) IMPLANT
SYR TOOMEY IRRIG 70ML (MISCELLANEOUS) ×1
SYRINGE TOOMEY IRRIG 70ML (MISCELLANEOUS) ×1 IMPLANT
TUBING CONNECTING 10 (TUBING) ×1 IMPLANT
TUBING UROLOGY SET (TUBING) ×1 IMPLANT
WATER STERILE IRR 3000ML UROMA (IV SOLUTION) ×1 IMPLANT

## 2023-08-16 NOTE — Anesthesia Procedure Notes (Signed)
Procedure Name: LMA Insertion Date/Time: 08/16/2023 7:38 AM  Performed by: Kizzie Fantasia, CRNAPre-anesthesia Checklist: Patient identified, Emergency Drugs available, Suction available, Patient being monitored and Timeout performed Patient Re-evaluated:Patient Re-evaluated prior to induction Oxygen Delivery Method: Circle system utilized Preoxygenation: Pre-oxygenation with 100% oxygen Induction Type: IV induction Ventilation: Mask ventilation without difficulty LMA: LMA inserted LMA Size: 4.0 Number of attempts: 1 Placement Confirmation: positive ETCO2 and breath sounds checked- equal and bilateral Tube secured with: Tape Dental Injury: Teeth and Oropharynx as per pre-operative assessment

## 2023-08-16 NOTE — Op Note (Signed)
Operative Note  Preoperative diagnosis:  1.  Penile and bulbar urethral strictures  2.  BPH with LUTS  Postoperative diagnosis: 1.  Penile and bulbar urethral strictures  2.  BPH with LUTS  Procedure(s): 1.  Urethral dilation 2.  Repeat TURP  Surgeon: Rhoderick Moody, MD  Assistants:  None  Anesthesia:  General  Complications:  None  EBL:  10 mL   Specimens: 1.  Prostate chips  Drains/Catheters: 1.  22 French three-way Foley catheter with 30 mL in the balloon  Intraoperative findings:   Within penile and bulbar urethral strictures Prostate regrowth, especially involving the left lobe Widely patent prostatic urethra following TURP with minimal bleeding  Indication:  Author [name] [name] is a 83 y.o. male with a history of BPH with worsening LUTS.  The patient recently underwent a cystoscopy in the office and was found to have multiple thin penile and bulbar urethral strictures as well as prostatic regrowth following his TURP in 2009.  He has been consented for the above procedures, voices understanding and wishes to proceed.  Description of procedure:  After informed consent was obtained, the patient was brought to the operating room and general LMA anesthesia was administered. The patient was then placed in the dorsolithotomy position and prepped and draped in the usual sterile fashion. A timeout was performed.  Starting at 60 French, [employer] sounds were then used to incrementally dilate his urethra up to 28 [place], and 2 [place] increments.  A 21 French rigid cystoscope was then inserted into the urethra and advanced into the bladder.  There was no significant trauma following dilation of adjacent urethral strictures.  He was noted to have prostate regrowth, especially involving the left lobe with no other intravesical abnormalities.  The rigid cystoscope was then exchanged for a 26 French resectoscope with a bipolar loop working element.  The scope was then advanced  into the bladder.  The bipolar loop was then used to resect the prostatic urethra until it was widely patent.  All prostate chips were then evacuated from the lumen of the bladder through the sheath of the resectoscope.  The area of resection was then extensively fulgurated until hemostasis was achieved.  The resectoscope was then removed.  A 22 French three-way Foley catheter was then inserted with return of clear irrigant.  The Foley catheter was then extensively hand irrigated no residual prostate chips or hematuria was seen.  The Foley catheter was then hooked up to CBI.  The patient tolerated the procedure well and was transferred to the postanesthesia in stable condition.  Plan: Monitor on the floor overnight

## 2023-08-16 NOTE — Plan of Care (Signed)
  Problem: Clinical Measurements: Goal: Diagnostic test results will improve Outcome: Progressing   Problem: Activity: Goal: Risk for activity intolerance will decrease Outcome: Progressing   Problem: Nutrition: Goal: Adequate nutrition will be maintained Outcome: Progressing   Problem: Pain Management: Goal: General experience of comfort will improve Outcome: Progressing   Problem: Safety: Goal: Ability to remain free from injury will improve Outcome: Progressing

## 2023-08-16 NOTE — Transfer of Care (Signed)
Immediate Anesthesia Transfer of Care Note  Patient: [name] [name]  Procedure(s) Performed: TRANSURETHRAL RESECTION OF THE PROSTATE (TURP) (Urethra) CYSTOSCOPY WITH URETHRAL DILATATION (Urethra)  Patient Location: PACU  Anesthesia Type:General  Level of Consciousness: sedated, patient cooperative, and responds to stimulation  Airway & Oxygen Therapy: Patient Spontanous Breathing and Patient connected to face mask oxygen  Post-op Assessment: Report given to RN and Post -op Vital signs reviewed and stable  Post vital signs: Reviewed and stable  Last Vitals:  Vitals Value Taken Time  BP 126/81 08/16/23 0830  Temp    Pulse 68 08/16/23 0834  Resp 14 08/16/23 0834  SpO2 100 % 08/16/23 0834  Vitals shown include unfiled device data.  Last Pain:  Vitals:   08/16/23 0621  TempSrc: Oral  PainSc:          Complications: No notable events documented.

## 2023-08-16 NOTE — Anesthesia Postprocedure Evaluation (Signed)
Anesthesia Post Note  Patient: [name] [name]  Procedure(s) Performed: TRANSURETHRAL RESECTION OF THE PROSTATE (TURP) (Urethra) CYSTOSCOPY WITH URETHRAL DILATATION (Urethra)     Patient location during evaluation: PACU Anesthesia Type: General Level of consciousness: awake and alert, oriented and patient cooperative Pain management: pain level controlled Vital Signs Assessment: post-procedure vital signs reviewed and stable Respiratory status: spontaneous breathing, nonlabored ventilation and respiratory function stable Cardiovascular status: blood pressure returned to baseline and stable Postop Assessment: no apparent nausea or vomiting Anesthetic complications: no   No notable events documented.  Last Vitals:  Vitals:   08/16/23 0845 08/16/23 0900  BP: 123/76 107/64  Pulse: 69 65  Resp: 14 13  Temp:    SpO2: 100% 99%    Last Pain:  Vitals:   08/16/23 0900  TempSrc:   PainSc: 3                  Lannie Fields

## 2023-08-16 NOTE — H&P (Signed)
Urology Preoperative H&P   Chief Complaint: urinary urgency and weak force of stream  History of Present Illness: [name] [name] is a 82 y.o. male with [name] is a 82 year old male previously followed by Dr. Patsi Sears for BPH with LUTS (s/p TURP in 2009 and taking tamsulosin) and erectile dysfunction.   He was instructed to start tamsulosin twice daily at his last visit, but states that he did not start the medication as instructed. He continues to report a weak force of stream with persistent urgency and frequency every 2-3 hours. Urine culture at his last visit grew multiple organisms and was treated with a course of Bactrim. UA on 07/08/23 was clear.   Cystoscopy on 07/08/23 revealed multiple urethral strictures that were easily bypassed by the cystoscope as well as heavy prostatic regrowth.  He is here today for urethral dilation and repeat TURP.     Past Medical History:  Diagnosis Date   Anxiety    Arthritis    Coronary artery disease    status post PTCA and stenting of his left circumflex artery and right coronary artery   GERD (gastroesophageal reflux disease)    H/O heart artery stent    Hyperlipidemia    Hypertension    Pt denies   Reflux    omeprazole 40mg     Past Surgical History:  Procedure Laterality Date   Benign Cyst     on back   CARDIOVASCULAR STRESS TEST  05/09/2007   EF 64%   carpal tunnel left     CATARACT EXTRACTION     bilateral- april 15th with repeat 3 weeks later   CHOLECYSTECTOMY N/A 01/12/2021   Procedure: LAPAROSCOPIC CHOLECYSTECTOMY WITH INTRAOPERATIVE CHOLANGIOGRAM;  Surgeon: Darnell Level, MD;  Location[facility];  Service: General;  Laterality: N/A;  90MIN/RM2   CORONARY ANGIOPLASTY WITH STENT PLACEMENT     CORONARY BALLOON ANGIOPLASTY N/A 04/11/2023   Procedure: CORONARY BALLOON ANGIOPLASTY;  Surgeon: Orbie Pyo, MD;  [facility];  Service: Cardiovascular;  Laterality: N/A;   CORONARY PRESSURE/FFR STUDY Left 04/11/2023    Procedure: CORONARY PRESSURE/FFR STUDY;  Surgeon: Orbie Pyo, MD;  [facility];  Service: Cardiovascular;  Laterality: Left;   CYSTECTOMY     removed from finger   cystoscopy     LEFT HEART CATH AND CORONARY ANGIOGRAPHY N/A 04/11/2023   Procedure: LEFT HEART CATH AND CORONARY ANGIOGRAPHY;  Surgeon: Orbie Pyo, MD;  [facility];  Service: Cardiovascular;  Laterality: N/A;   LIPOMA EXCISION     Back   right knee surgery     arthroscopyc 02/2010    Allergies:  Allergies  Allergen Reactions   Hydrocodone Nausea And Vomiting   Simvastatin Other (See Comments)    Leg pain     Family History  Problem Relation Age of Onset   Bladder Cancer Mother        passed age 75.    Heart attack Father        8 passed from this   Alcohol abuse Father     Social History:  reports that he has never smoked. He has never used smokeless tobacco. He reports that he does not drink alcohol and does not use drugs.  ROS: A complete review of systems was performed.  All systems are negative except for pertinent findings as noted.  Physical Exam:  Vital signs in last 24 hours: Temp:  [98 F (36.7 C)] 98 F (  36.7 C) (11/12 0621) Pulse Rate:  [73] 73 (11/12 0621) Resp:  [18] 18 (11/12 0621) BP: (110)/(71) 110/71 (11/12 0621) SpO2:  [100 %] 100 % (11/12 0621) Weight:  [74.4 kg] 74.4 kg (11/12 0617) Constitutional:  Alert and oriented, No acute distress Cardiovascular: Regular rate and rhythm, No JVD Respiratory: Normal respiratory effort, Lungs clear bilaterally GI: Abdomen is soft, nontender, nondistended, no abdominal masses GU: No CVA tenderness Lymphatic: No lymphadenopathy Neurologic: Grossly intact, no focal deficits Psychiatric: Normal mood and affect  Laboratory Data:  No results for input(s): "WBC", "HGB", "HCT", "PLT" in the last 72 hours.  No results for input(s): "NA", "K", "CL", "GLUCOSE", "BUN", "CALCIUM", "CREATININE" in the last 72  hours.  Invalid input(s): "CO3"   No results found for this or any previous visit (from the past 24 hour(s)). Recent Results (from the past 240 hour(s))  Urine Culture     Status: Abnormal   Collection Time: 08/09/23  8:58 AM   Specimen: Urine, Clean Catch  Result Value Ref Range Status   Specimen Description   Final    URINE, CLEAN CATCH Performed at [facility], [address] [place], [place] [address]    Special Requests   Final    NONE Performed at [facility], [address], [place], [place] [address]    Culture (A)  Final    <10,000 COLONIES/mL INSIGNIFICANT GROWTH Performed at [facility], [address] [place], [place] [address]    Report Status 08/10/2023 FINAL  Final    Renal Function: Recent Labs    08/09/23 0858  CREATININE 0.69   Estimated Creatinine Clearance: 71.3 mL/min (by C-G formula based on SCr of 0.69 mg/dL).  Radiologic Imaging: No results found.  I independently reviewed the above imaging studies.  Assessment and Plan Christophere Kimpson [name] is a 82 y.o. male with the diagnoses listed below   1 GU:   BPH w/LUTS - N40.1 Chronic, Stable  2   Nocturia - R35.1 Chronic, Stable  3   Anterior urethral stricture - N35.013 Undiagnosed New Problem  4   Bulbar urethral stricture - N35.011 Undiagnosed New Problem   PLAN:           Schedule Return Visit/Planned Activity: Next Available Appointment - Schedule Surgery          Document Letter(s):  Created for Aldine Contes. Marti Sleigh, MD   Created for Patient: Clinical Summary         Notes:   - Cystoscopy revealed multiple distal, penile and bulbar urethral strictures that were able to be bypassed with the 16 French flexible cystoscope. He did have relatively heavy prostate regrowth, specifically at the left lobe. Mild bladder trabeculation with no other intravesical abnormalities   -The risks, benefits and alternatives of cystoscopy with balloon  dilation of urethral strictures and TURP was discussed with the patient. The risks included, but are not limited to, bleeding, urinary tract infection, bladder perforation requiring prolonged catheterization and/or open bladder repair, ureteral injury, ureteral obstruction, urethral stricture disease, new or worsening voiding dysfunction, retrograde ejaculation, MI, CVA, PE, DVT and the inherent risks of general anesthesia. We also discussed the need for Foley catheterization for at least 3 days post-op and the likely need for post-op observation in the hospital following the procedure. The patient voices understanding and wishes to proceed.

## 2023-08-17 ENCOUNTER — Encounter (HOSPITAL_COMMUNITY): Payer: Self-pay | Admitting: Urology

## 2023-08-17 DIAGNOSIS — N401 Enlarged prostate with lower urinary tract symptoms: Secondary | ICD-10-CM | POA: Diagnosis not present

## 2023-08-17 DIAGNOSIS — I10 Essential (primary) hypertension: Secondary | ICD-10-CM | POA: Diagnosis not present

## 2023-08-17 DIAGNOSIS — N35912 Unspecified bulbous urethral stricture, male: Secondary | ICD-10-CM | POA: Diagnosis not present

## 2023-08-17 DIAGNOSIS — I2510 Atherosclerotic heart disease of native coronary artery without angina pectoris: Secondary | ICD-10-CM | POA: Diagnosis not present

## 2023-08-17 DIAGNOSIS — Z955 Presence of coronary angioplasty implant and graft: Secondary | ICD-10-CM | POA: Diagnosis not present

## 2023-08-17 MED ORDER — CHLORHEXIDINE GLUCONATE CLOTH 2 % EX PADS
6.0000 | MEDICATED_PAD | Freq: Every day | CUTANEOUS | Status: DC
  Administered 2023-08-17: 6 via TOPICAL

## 2023-08-17 MED ORDER — DOCUSATE SODIUM 100 MG PO CAPS: 100.0000 mg | ORAL_CAPSULE | Freq: Two times a day (BID) | ORAL | 0 refills | Status: DC | PRN

## 2023-08-17 MED ORDER — BISACODYL 10 MG RE SUPP
10.0000 mg | Freq: Once | RECTAL | Status: DC
  Filled 2023-08-17: qty 1

## 2023-08-17 NOTE — Plan of Care (Signed)
CBI in place and appears to be draining well. Pt c/o mild pain this morning and pink tinged out put noted in collection mad. CBI rate was increased, pt tolerated increased without complaints. Pt. Reported controled pain upon reassessment of PRN pain medication. Call light within reach.   Problem: Education: Goal: Knowledge of General Education information will improve Description: Including pain rating scale, medication(s)/side effects and non-pharmacologic comfort measures Outcome: Progressing   Problem: Clinical Measurements: Goal: Ability to maintain clinical measurements within normal limits will improve Outcome: Progressing   Problem: Activity: Goal: Risk for activity intolerance will decrease Outcome: Progressing   Problem: Nutrition: Goal: Adequate nutrition will be maintained Outcome: Progressing   Problem: Coping: Goal: Level of anxiety will decrease Outcome: Progressing   Problem: Pain Management: Goal: General experience of comfort will improve Outcome: Progressing

## 2023-08-17 NOTE — Plan of Care (Signed)
  Problem: Health Behavior/Discharge Planning: Goal: Ability to manage health-related needs will improve Outcome: Progressing   Problem: Clinical Measurements: Goal: Ability to maintain clinical measurements within normal limits will improve Outcome: Progressing   Problem: Elimination: Goal: Will not experience complications related to bowel motility Outcome: Progressing   Problem: Pain Management: Goal: General experience of comfort will improve Outcome: Progressing   Problem: Urinary Elimination: Goal: Ability to avoid or minimize complications of infection will improve Outcome: Progressing   Problem: Education: Goal: Knowledge of General Education information will improve Description: Including pain rating scale, medication(s)/side effects and non-pharmacologic comfort measures Outcome: Adequate for Discharge   Problem: Clinical Measurements: Goal: Will remain free from infection Outcome: Adequate for Discharge Goal: Diagnostic test results will improve Outcome: Adequate for Discharge Goal: Respiratory complications will improve Outcome: Adequate for Discharge Goal: Cardiovascular complication will be avoided Outcome: Adequate for Discharge   Problem: Activity: Goal: Risk for activity intolerance will decrease Outcome: Adequate for Discharge   Problem: Nutrition: Goal: Adequate nutrition will be maintained Outcome: Adequate for Discharge   Problem: Coping: Goal: Level of anxiety will decrease Outcome: Adequate for Discharge   Problem: Elimination: Goal: Will not experience complications related to urinary retention Outcome: Adequate for Discharge   Problem: Safety: Goal: Ability to remain free from injury will improve Outcome: Adequate for Discharge   Problem: Skin Integrity: Goal: Risk for impaired skin integrity will decrease Outcome: Adequate for Discharge   Problem: Education: Goal: Knowledge of the prescribed therapeutic regimen will  improve Outcome: Adequate for Discharge   Problem: Bowel/Gastric: Goal: Gastrointestinal status for postoperative course will improve Outcome: Adequate for Discharge   Problem: Skin Integrity: Goal: Demonstration of wound healing without infection will improve Outcome: Adequate for Discharge

## 2023-08-17 NOTE — Progress Notes (Signed)
Mobility Specialist - Progress Note   08/17/23 1457  Mobility  Activity Ambulated with assistance in hallway  Level of Assistance Modified independent, requires aide device or extra time  Assistive Device Other (Comment) (IV Pole)  Distance Ambulated (ft) 400 ft  Activity Response Tolerated well  Mobility Referral Yes  $Mobility charge 1 Mobility  Mobility Specialist Start Time (ACUTE ONLY) 0240  Mobility Specialist Stop Time (ACUTE ONLY) 0251  Mobility Specialist Time Calculation (min) (ACUTE ONLY) 11 min   Pt received in bed and agreeable to mobility. No complaints during session. Pt to bed after session with all needs met.    [facility]

## 2023-08-17 NOTE — TOC CM/SW Note (Signed)
Transition of Care [facility]) - Inpatient Brief Assessment   Patient Details  Name: [name] [name] MRN: [id number] Date of Birth: 06-25-1941  [facility] of [facility]) CM/SW Contact:    Larrie Kass, LCSW Phone Number: 08/17/2023, 4:14 PM   Clinical Narrative:  Pt reports his wife will provided transportation upon d/c. TOC sign off  Transition of Care Asessment: Insurance and Status: Insurance coverage has been reviewed Patient has primary care physician: Yes Home environment has been reviewed: home with spouse Prior level of function:: independent Prior/Current Home Services: No current home services Social Determinants of Health Reivew: SDOH reviewed no interventions necessary Readmission risk has been reviewed: Yes Transition of care needs: no transition of care needs at this time

## 2023-08-17 NOTE — Care Management Obs Status (Signed)
MEDICARE OBSERVATION STATUS NOTIFICATION   Patient Details  Name: [name] [name] MRN: [id number] Date of Birth: 11-28-1940   Medicare Observation Status Notification Given:  Yes    Larrie Kass, LCSW 08/17/2023, 3:25 PM

## 2023-08-22 ENCOUNTER — Other Ambulatory Visit: Payer: Self-pay | Admitting: Cardiovascular Disease

## 2023-08-24 NOTE — Discharge Summary (Signed)
Date of admission: 08/16/2023  Date of discharge: 08/17/2023  Admission diagnosis: BPH with lower urinary tract symptoms and urethral stricture  Discharge diagnosis: Same  Procedures: Cystoscopy with urethral dilation and TURP  History and Physical: For full details, please see admission history and physical. Briefly, [name] [name] is a 82 y.o. year old patient with a history of urethral stricture disease and BPH with lower urinary tract symptoms.  He underwent an uneventful cystoscopy with urethral dilation and TURP on 08/16/2023.  Hospital Course: The patient was monitored on the floor postoperatively.  His three-way Foley catheter was draining clear urine without CBI  Physical Exam:  General: Alert and oriented CV: RRR, palpable distal pulses Lungs: CTAB, equal chest rise Abdomen: Soft, NTND, no rebound or guarding GU: 22 French three-way Foley catheter in place and draining clear urine Ext: NT, No erythema  Laboratory values: No results for input(s): "HGB", "HCT" in the last 72 hours. No results for input(s): "CREATININE" in the last 72 hours.  Disposition: Home  Discharge instruction: The patient was instructed to be ambulatory but told to refrain from heavy lifting, strenuous activity, or driving.  Discharge medications:  Allergies as of 08/17/2023       Reactions   Hydrocodone Nausea And Vomiting   Simvastatin Other (See Comments)   Leg pain        Medication List     STOP taking these medications    aspirin EC 81 MG tablet   clopidogrel 75 MG tablet Commonly known as: Plavix       TAKE these medications    atorvastatin 40 MG tablet Commonly known as: LIPITOR Take 1 tablet (40 mg total) by mouth daily.   Blue-Emu Hemp 10 % cream Generic drug: trolamine salicylate Apply 1 Application topically daily as needed for muscle pain.   CALCIUM + D PO Take 1 tablet by mouth daily.   docusate sodium 100 MG capsule Commonly known as: Colace Take 1  capsule (100 mg total) by mouth 2 (two) times daily as needed for mild constipation.   Fish Oil 1200 MG Caps Take 1,200 mg by mouth daily.   fluticasone 50 MCG/ACT nasal spray Commonly known as: FLONASE Place 2 sprays into both nostrils daily. What changed:  when to take this reasons to take this   glucosamine-chondroitin 500-400 MG tablet Take 1 tablet by mouth daily.   ibuprofen 200 MG tablet Commonly known as: ADVIL Take 400 mg by mouth every 6 (six) hours as needed for moderate pain or mild pain.   isosorbide mononitrate 30 MG 24 hr tablet Commonly known as: IMDUR Take 0.5 tablets (15 mg total) by mouth daily.   Melatonin 10 MG Tabs Take 10 mg by mouth at bedtime.   MULTIVITAMIN PO Take 1 tablet by mouth daily.   nitroGLYCERIN 0.4 MG SL tablet Commonly known as: NITROSTAT Place 1 tablet (0.4 mg total) under the tongue every 5 (five) minutes as needed for chest pain.   pantoprazole 40 MG tablet Commonly known as: Protonix Take 1 tablet (40 mg total) by mouth daily.   sildenafil 100 MG tablet Commonly known as: VIAGRA Take 1 tablet (100 mg total) by mouth daily as needed for erectile dysfunction.   tamsulosin 0.4 MG Caps capsule Commonly known as: FLOMAX TAKE 1 CAPSULE EVERY DAY   traZODone 50 MG tablet Commonly known as: DESYREL TAKE 1/2 TO 1 TABLET AT BEDTIME AS NEEDED FOR SLEEP What changed:  how much to take when to take this additional  instructions        Followup:   Follow-up Information     [facility] Follow up on 08/23/2023.   Why: Post-op appointment at 0830 Contact information: [address] [place] [address] [phone]

## 2023-08-29 ENCOUNTER — Other Ambulatory Visit: Payer: Self-pay | Admitting: Family Medicine

## 2023-08-29 ENCOUNTER — Other Ambulatory Visit: Payer: Self-pay | Admitting: Cardiovascular Disease

## 2023-08-30 LAB — SURGICAL PATHOLOGY

## 2023-09-14 DIAGNOSIS — R3915 Urgency of urination: Secondary | ICD-10-CM | POA: Diagnosis not present

## 2023-09-16 DIAGNOSIS — M48062 Spinal stenosis, lumbar region with neurogenic claudication: Secondary | ICD-10-CM | POA: Diagnosis not present

## 2023-09-20 ENCOUNTER — Telehealth: Payer: Self-pay | Admitting: *Deleted

## 2023-09-20 NOTE — Telephone Encounter (Signed)
   Name: [name] [name]  DOB: [birth year]  MRN: [id number]  Primary Cardiologist: [name], MD   Preoperative team, please contact this patient and set up a phone call appointment for further preoperative risk assessment. Please obtain consent and complete medication review. Thank you for your help.  I confirm that guidance regarding antiplatelet and oral anticoagulation therapy has been completed and, if necessary, noted below.  Per office protocol, if patient is without any new symptoms or concerns at the time of their virtual visit, he may hold Plavix for 5 days prior to procedure. Please resume Plavix as soon as possible postprocedure, at the discretion of the surgeon.    I also confirmed the patient resides in the state of [place]. As per [facility] Medical Board telemedicine laws, the patient must reside in the state in which the provider is licensed.   Joylene Grapes, NP 09/20/2023, 4:12 PM [facility]

## 2023-09-20 NOTE — Telephone Encounter (Signed)
   Pre-operative Risk Assessment    Patient Name: [name] [name]  DOB: 10-21-1940 MRN: [id number]  DATE OF LAST VISIT: 06/08/23 DR. NAHSER  DATE OF NEXT VISIT: NONE     Request for Surgical Clearance    Procedure:   L5-S1 ESI  Date of Surgery:  Clearance TBD                                 Surgeon:  DR. DAVE [facility] Surgeon's Group or Practice Name:  [facility] Phone number:  [phone] Fax number:  [phone]   Type of Clearance Requested:   - Medical  - Pharmacy:  Hold Clopidogrel (Plavix) x 7 DAYS PRIOR   Type of Anesthesia:  Not Indicated   Additional requests/questions:    Elpidio Anis   09/20/2023, 10:47 AM

## 2023-09-21 NOTE — Telephone Encounter (Signed)
1st attempt to reach pt to schedule tele visit. No answer. Lvm

## 2023-09-26 ENCOUNTER — Telehealth: Payer: Self-pay | Admitting: *Deleted

## 2023-09-26 NOTE — Telephone Encounter (Signed)
Schedule pre op tele appt. Med rec and consent done. Will remove from pre op pool.     Patient Consent for Virtual Visit         [name] [name] has provided verbal consent on 09/26/2023 for a virtual visit (video or telephone).   CONSENT FOR VIRTUAL VISIT FOR:  [name] [name]  By participating in this virtual visit I agree to the following:  I hereby voluntarily request, consent and authorize [facility] and its employed or contracted physicians, physician assistants, nurse practitioners or other licensed health care professionals (the Practitioner), to provide me with telemedicine health care services (the "Services") as deemed necessary by the treating Practitioner. I acknowledge and consent to receive the Services by the Practitioner via telemedicine. I understand that the telemedicine visit will involve communicating with the Practitioner through live audiovisual communication technology and the disclosure of certain medical information by electronic transmission. I acknowledge that I have been given the opportunity to request an in-person assessment or other available alternative prior to the telemedicine visit and am voluntarily participating in the telemedicine visit.  I understand that I have the right to withhold or withdraw my consent to the use of telemedicine in the course of my care at any time, without affecting my right to future care or treatment, and that the Practitioner or I may terminate the telemedicine visit at any time. I understand that I have the right to inspect all information obtained and/or recorded in the course of the telemedicine visit and may receive copies of available information for a reasonable fee.  I understand that some of the potential risks of receiving the Services via telemedicine include:  Delay or interruption in medical evaluation due to technological equipment failure or disruption; Information transmitted may not be sufficient  (e.g. poor resolution of images) to allow for appropriate medical decision making by the Practitioner; and/or  In rare instances, security protocols could fail, causing a breach of personal health information.  Furthermore, I acknowledge that it is my responsibility to provide information about my medical history, conditions and care that is complete and accurate to the best of my ability. I acknowledge that Practitioner's advice, recommendations, and/or decision may be based on factors not within their control, such as incomplete or inaccurate data provided by me or distortions of diagnostic images or specimens that may result from electronic transmissions. I understand that the practice of medicine is not an exact science and that Practitioner makes no warranties or guarantees regarding treatment outcomes. I acknowledge that a copy of this consent can be made available to me via my patient portal [facility] MyChart), or I can request a printed copy by calling the office of [facility].    I understand that my insurance will be billed for this visit.   I have read or had this consent read to me. I understand the contents of this consent, which adequately explains the benefits and risks of the Services being provided via telemedicine.  I have been provided ample opportunity to ask questions regarding this consent and the Services and have had my questions answered to my satisfaction. I give my informed consent for the services to be provided through the use of telemedicine in my medical care

## 2023-10-11 ENCOUNTER — Ambulatory Visit: Payer: Medicare HMO | Attending: Nurse Practitioner | Admitting: Nurse Practitioner

## 2023-10-11 ENCOUNTER — Encounter: Payer: Self-pay | Admitting: Nurse Practitioner

## 2023-10-11 DIAGNOSIS — Z01810 Encounter for preprocedural cardiovascular examination: Secondary | ICD-10-CM | POA: Diagnosis not present

## 2023-10-11 NOTE — Progress Notes (Signed)
 Virtual Visit via Telephone Note   Because of [name] [name]'s co-morbid illnesses, he is at least at moderate risk for complications without adequate follow up.  This format is felt to be most appropriate for this patient at this time.  The patient did not have access to video technology/had technical difficulties with video requiring transitioning to audio format only (telephone).  All issues noted in this document were discussed and addressed.  No physical exam could be performed with this format.  Please refer to the patient's chart for his consent to telehealth for [facility].  Evaluation Performed:  Preoperative cardiovascular risk assessment _____________   Date:  10/11/2023   Patient ID:  [name] [name], DOB [birth year], MRN [id number] Patient Location:  Home Provider location:   Office  Primary Care Provider:  Katrinka Garnette KIDD, MD Primary Cardiologist:  Aleene Passe, MD  Chief Complaint / Patient Profile   83 y.o. y/o male with a h/o CAD s/p PTCA and stenting of LCx and RCA, hyperlipidemia, hypertension who is pending L5-S1 ESI with Dr. Darlis on date TBD and presents today for telephonic preoperative cardiovascular risk assessment.  History of Present Illness    [name] [name] is a 83 y.o. male who presents via audio/video conferencing for a telehealth visit today.  Pt was last seen in cardiology clinic on 06/08/23 by Dr. Passe.  At that time [name] [name] was doing well.  The patient is now pending procedure as outlined above. Since his last visit, he denies chest pain, shortness of breath, lower extremity edema, fatigue, palpitations, melena, hematuria, hemoptysis, diaphoresis, weakness, presyncope, syncope, orthopnea, and PND. He remains active bowling for the week along with yard and housework and he does not have any concerning cardiac symptoms with exertion.  Past Medical History    Past Medical History:  Diagnosis Date   Anxiety     Arthritis    Coronary artery disease    status post PTCA and stenting of his left circumflex artery and right coronary artery   GERD (gastroesophageal reflux disease)    H/O heart artery stent    Hyperlipidemia    Hypertension    Pt denies   Reflux    omeprazole  40mg    Past Surgical History:  Procedure Laterality Date   Benign Cyst     on back   CARDIOVASCULAR STRESS TEST  05/09/2007   EF 64%   carpal tunnel left     CATARACT EXTRACTION     bilateral- april 15th with repeat 3 weeks later   CHOLECYSTECTOMY N/A 01/12/2021   Procedure: LAPAROSCOPIC CHOLECYSTECTOMY WITH INTRAOPERATIVE CHOLANGIOGRAM;  Surgeon: Eletha Boas, MD;  Location: [facility];  Service: General;  Laterality: N/A;  90MIN/RM2   CORONARY ANGIOPLASTY WITH STENT PLACEMENT     CORONARY BALLOON ANGIOPLASTY N/A 04/11/2023   Procedure: CORONARY BALLOON ANGIOPLASTY;  Surgeon: Wendel Lurena POUR, MD;  Location: [facility]AS[facility] CV [facility];  Service: Cardiovascular;  Laterality: N/A;   CORONARY PRESSURE/FFR STUDY Left 04/11/2023   Procedure: CORONARY PRESSURE/FFR STUDY;  Surgeon: Wendel Lurena POUR, MD;  Location: [facility] INVASIVE CV LAB;  Service: Cardiovascular;  Laterality: Left;   CYSTECTOMY     removed from finger   cystoscopy     CYSTOSCOPY WITH URETHRAL DILATATION N/A 08/16/2023   Procedure: CYSTOSCOPY WITH URETHRAL DILATATION;  Surgeon: Devere Lonni Righter, MD;  Location: [facility];  Service: Urology;  Laterality: N/A;   LEFT HEART CATH AND CORONARY ANGIOGRAPHY N/A 04/11/2023   Procedure:  LEFT HEART CATH AND CORONARY ANGIOGRAPHY;  Surgeon: Thukkani, Arun K, MD;  Location: [facility];  Service: Cardiovascular;  Laterality: N/A;   LIPOMA EXCISION     Back   right knee surgery     arthroscopyc 02/2010   TRANSURETHRAL RESECTION OF PROSTATE N/A 08/16/2023   Procedure: TRANSURETHRAL RESECTION OF THE PROSTATE (TURP);  Surgeon: Devere Lonni Righter, MD;  Location: [facility];  Service: Urology;  Laterality: N/A;  90 MINUTES     Allergies  Allergies  Allergen Reactions   Hydrocodone Nausea And Vomiting   Simvastatin Other (See Comments)    Leg pain     Home Medications    Prior to Admission medications   Medication Sig Start Date End Date Taking? Authorizing Provider  atorvastatin  (LIPITOR) 40 MG tablet TAKE 1 TABLET EVERY DAY 08/29/23   Nahser, Aleene PARAS, MD  Calcium  Carbonate-Vitamin D  (CALCIUM  + D PO) Take 1 tablet by mouth daily.    [provider]  clopidogrel  (PLAVIX ) 75 MG tablet Take 75 mg by mouth daily.    [provider]  glucosamine-chondroitin 500-400 MG tablet Take 1 tablet by mouth daily.    [provider]  ibuprofen (ADVIL) 200 MG tablet Take 400 mg by mouth every 6 (six) hours as needed for moderate pain or mild pain.    [provider]  isosorbide  mononitrate (IMDUR ) 30 MG 24 hr tablet Take 0.5 tablets (15 mg total) by mouth daily. 04/26/23 09/26/23  Lucien Orren SAILOR, PA-C  Melatonin 10 MG TABS Take 10 mg by mouth at bedtime.    [provider]  metoprolol  tartrate (LOPRESSOR ) 25 MG tablet TAKE 1 TABLET TWICE DAILY 08/22/23   Nahser, Aleene PARAS, MD  Multiple Vitamin (MULTIVITAMIN PO) Take 1 tablet by mouth daily.    [provider]  nitroGLYCERIN  (NITROSTAT ) 0.4 MG SL tablet Place 1 tablet (0.4 mg total) under the tongue every 5 (five) minutes as needed for chest pain. 03/22/23   Nahser, Aleene PARAS, MD  Omega-3 Fatty Acids (FISH OIL) 1200 MG CAPS Take 1,200 mg by mouth daily.    [provider]  pantoprazole  (PROTONIX ) 40 MG tablet Take 1 tablet (40 mg total) by mouth daily. 04/19/23 04/18/24  Katrinka Garnette KIDD, MD  sildenafil  (VIAGRA ) 100 MG tablet Take 1 tablet (100 mg total) by mouth daily as needed for erectile dysfunction. 06/09/22   Nahser, Aleene PARAS, MD  tamsulosin  (FLOMAX ) 0.4 MG CAPS capsule TAKE 1 CAPSULE EVERY DAY 06/13/23   Katrinka Garnette KIDD, MD  traZODone  (DESYREL ) 50 MG tablet TAKE 1/2 TO 1 TABLET AT BEDTIME AS NEEDED FOR SLEEP  08/29/23   Katrinka Garnette KIDD, MD  trolamine salicylate (BLUE-EMU HEMP) 10 % cream Apply 1 Application topically daily as needed for muscle pain.    [provider]    Physical Exam    Vital Signs:  [name] [name] does not have vital signs available for review today.  Given telephonic nature of communication, physical exam is limited. AAOx3. NAD. Normal affect.  Speech and respirations are unlabored.  Accessory Clinical Findings    None  Assessment & Plan    1.  Preoperative Cardiovascular Risk Assessment: According to the Revised Cardiac Risk Index (RCRI), his Perioperative Risk of Major Cardiac Event is (%): 0.9. His Functional Capacity in METs is: 7.34 according to the Duke Activity Status Index (DASI). The patient is doing well from a cardiac perspective. Therefore, based on ACC/AHA guidelines, the patient would be at acceptable  risk for the planned procedure without further cardiovascular testing.   The patient was advised that if he develops new symptoms prior to surgery to contact our office to arrange for a follow-up visit, and he verbalized understanding.  Per office protocol, he may hold Plavix  for 5 days prior to procedure. Please resume Plavix  as soon as possible postprocedure, at the discretion of the surgeon.   A copy of this note will be routed to requesting surgeon.  Time:   Today, I have spent 10 minutes with the patient with telehealth technology discussing medical history, symptoms, and management plan.    Rosaline EMERSON Bane, NP-C  10/11/2023, 1:38 PM [address] Office [phone] Fax [phone]

## 2023-10-11 NOTE — Telephone Encounter (Signed)
 Per Dr. Elease Hashimoto, patient may hold Plavix for 7 days prior to procedure.  Levi Aland, NP-C  10/11/2023, 4:51 PM [address] Office [phone] Fax [phone]

## 2023-10-11 NOTE — Telephone Encounter (Signed)
 [facility] called in stating pt was cleared for 5 days but they need him cleared for 7 days, please advise.

## 2023-10-25 ENCOUNTER — Ambulatory Visit (INDEPENDENT_AMBULATORY_CARE_PROVIDER_SITE_OTHER): Payer: Medicare HMO | Admitting: Family Medicine

## 2023-10-25 ENCOUNTER — Encounter: Payer: Self-pay | Admitting: Family Medicine

## 2023-10-25 VITALS — BP 110/70 | HR 75 | Temp 97.0°F | Ht 68.0 in | Wt 164.4 lb

## 2023-10-25 DIAGNOSIS — I10 Essential (primary) hypertension: Secondary | ICD-10-CM

## 2023-10-25 DIAGNOSIS — E785 Hyperlipidemia, unspecified: Secondary | ICD-10-CM

## 2023-10-25 DIAGNOSIS — I2510 Atherosclerotic heart disease of native coronary artery without angina pectoris: Secondary | ICD-10-CM

## 2023-10-25 DIAGNOSIS — I700 Atherosclerosis of aorta: Secondary | ICD-10-CM | POA: Diagnosis not present

## 2023-10-25 MED ORDER — TAMSULOSIN HCL 0.4 MG PO CAPS: 0.4000 mg | ORAL_CAPSULE | Freq: Every day | ORAL | 3 refills | Status: DC

## 2023-10-25 MED ORDER — TRAZODONE HCL 50 MG PO TABS: ORAL_TABLET | ORAL | 3 refills | Status: DC

## 2023-10-25 NOTE — Patient Instructions (Addendum)
No labs today with prior stability- check next time  No change in medications today- just refills  Recommended follow up: Return in about 6 months (around 04/23/2024) for physical or sooner if needed.Schedule b4 you leave.

## 2023-10-25 NOTE — Progress Notes (Signed)
Phone [phone] In person visit   Subjective:   [name] [name] is a 83 y.o. year old very pleasant male patient who presents for/with See problem oriented charting Chief Complaint  Patient presents with   Medical Management of Chronic Issues   Hypertension   Hyperlipidemia   Past Medical History-  Patient Active Problem List   Diagnosis Date Noted   CAD (coronary artery disease) 06/21/2011    Priority: High   Aortic atherosclerosis (HCC) 12/17/2019    Priority: Medium    Insomnia 04/11/2019    Priority: Medium    Claudication of lower extremity (HCC) 10/24/2018    Priority: Medium    BPH (benign prostatic hyperplasia) 09/06/2016    Priority: Medium    Essential hypertension 08/14/2016    Priority: Medium    Hyperlipidemia 06/21/2011    Priority: Medium    Cholelithiasis with chronic cholecystitis 01/12/2021    Priority: Low   Allergic rhinitis 01/28/2020    Priority: Low   Plantar fasciitis of left foot 06/21/2018    Priority: Low   Anxiety state 09/06/2016    Priority: Low   History of adenomatous polyp of colon 08/14/2016    Priority: Low   Vitamin D deficiency 08/14/2016    Priority: Low   GERD (gastroesophageal reflux disease)     Priority: Low   BPH with urinary obstruction 08/16/2023   Hematoma of right forearm 04/12/2023   Thyromegaly 04/08/2021    Medications- reviewed and updated Current Outpatient Medications  Medication Sig Dispense Refill   atorvastatin (LIPITOR) 40 MG tablet TAKE 1 TABLET EVERY DAY 90 tablet 3   Calcium Carbonate-Vitamin D (CALCIUM + D PO) Take 1 tablet by mouth daily.     clopidogrel (PLAVIX) 75 MG tablet Take 75 mg by mouth daily.     glucosamine-chondroitin 500-400 MG tablet Take 1 tablet by mouth daily.     ibuprofen (ADVIL) 200 MG tablet Take 400 mg by mouth every 6 (six) hours as needed for moderate pain or mild pain.     Melatonin 10 MG TABS Take 10 mg by mouth at bedtime.     metoprolol tartrate (LOPRESSOR) 25  MG tablet TAKE 1 TABLET TWICE DAILY 180 tablet 3   Multiple Vitamin (MULTIVITAMIN PO) Take 1 tablet by mouth daily.     nitroGLYCERIN (NITROSTAT) 0.4 MG SL tablet Place 1 tablet (0.4 mg total) under the tongue every 5 (five) minutes as needed for chest pain. 25 tablet 6   Omega-3 Fatty Acids (FISH OIL) 1200 MG CAPS Take 1,200 mg by mouth daily.     pantoprazole (PROTONIX) 40 MG tablet Take 1 tablet (40 mg total) by mouth daily. 90 tablet 3   trolamine salicylate (BLUE-EMU HEMP) 10 % cream Apply 1 Application topically daily as needed for muscle pain.     isosorbide mononitrate (IMDUR) 30 MG 24 hr tablet Take 0.5 tablets (15 mg total) by mouth daily. 45 tablet 3   sildenafil (VIAGRA) 100 MG tablet Take 1 tablet (100 mg total) by mouth daily as needed for erectile dysfunction. (Patient not taking: Reported on 10/25/2023) 30 tablet 3   tamsulosin (FLOMAX) 0.4 MG CAPS capsule Take 1 capsule (0.4 mg total) by mouth daily. 90 capsule 3   traZODone (DESYREL) 50 MG tablet TAKE 1/2 TO 1 TABLET AT BEDTIME AS NEEDED FOR SLEEP 90 tablet 3   No current facility-administered medications for this visit.     Objective:  BP 110/70   Pulse 75   Temp [name])  97 F (36.1 C)   Ht 5\' 8"  (1.727 m)   Wt 164 lb 6.4 oz (74.6 kg)   SpO2 98%   BMI 25.00 kg/m  Gen: NAD, resting comfortably CV: RRR no murmurs rubs or gallops Lungs: CTAB no crackles, wheeze, rhonchi Ext: no edema Skin: warm, dry      Assessment and Plan   #CAD-follows with Dr. Elease Hashimoto with history of stent  #hyperlipidemia #Claudication lower extremity #Aortic atherosclerosis S: Medication: atorvastatin 40 mg ,asa 81 mg with addition of Plavix on 04/11/2023, metoprolol 25 mg twice daily -Claudication in thighs with climbing stairs-lately has been more of hip issues  -Most recent catheterization 04/11/2023-unable to place stent-balloon angioplasty completed with suboptimal result -Lipoprotein a not elevated - no chest pain or shortness of  breath Lab Results  Component Value Date   CHOL 103 03/23/2023   HDL 40 03/23/2023   LDLCALC 49 03/23/2023   LDLDIRECT 58.0 04/08/2021   TRIG 61 03/23/2023   CHOLHDL 2.6 03/23/2023   A/P: CAD- asymptomatic on imdur (so stable angina)   continue current medications  hyperlipidemia-at goal- continue current medications   Claudication lower extremity-stable to slightly better- more orthopedic issues lately  Aortic atherosclerosis (presumed stable)- LDL goal ideally <70 - at goal continue current medications   #hypertension S: medication: Metoprolol 25 mg twice daily BP Readings from Last 3 Encounters:  10/25/23 110/70  08/17/23 109/63  08/09/23 137/79  A/P: stable- continue current medicines    #BPH S: Surgery: TURP late 2024 and discovered low volume of cancer as well. Still dealing with leakage.  Medication: Tamsulosin 0.4 mg daily  A/P: symptoms improved with TURP and nocturia. Upcoming visit with urology to discuss the cancer as well.  I refilled tamsulosin   #Insomnia-takes melatonin 5 mg and/or trazodone 50 mg- needs refill- working reasonably well   #Low back pain- injections with [facility]  #right knee pain- gel shots x 3 not helpful in 2024. Dr. Charlann [facility]. Plans to chat with him at upcoming visit on hip and back issues. Still wants to be able to bowl    Recommended follow up: Return in about 6 months (around 04/23/2024) for physical or sooner if needed.Schedule b4 you leave. Future Appointments  Date Time Provider Department Center  03/13/2024  8:45 AM [facility] ANNUAL WELLNESS VISIT 1 [facility]    Lab/Order associations:   ICD-10-CM   1. Coronary artery disease involving native coronary artery of native heart without angina pectoris  I25.10     2. Essential hypertension  I10     3. Hyperlipidemia, unspecified hyperlipidemia type  E78.5     4. Aortic atherosclerosis (HCC)  I70.0       Meds ordered this encounter  Medications    tamsulosin (FLOMAX) 0.4 MG CAPS capsule    Sig: Take 1 capsule (0.4 mg total) by mouth daily.    Dispense:  90 capsule    Refill:  3   traZODone (DESYREL) 50 MG tablet    Sig: TAKE 1/2 TO 1 TABLET AT BEDTIME AS NEEDED FOR SLEEP    Dispense:  90 tablet    Refill:  3    Return precautions advised.  [name], MD

## 2023-10-27 DIAGNOSIS — M48062 Spinal stenosis, lumbar region with neurogenic claudication: Secondary | ICD-10-CM | POA: Diagnosis not present

## 2023-11-10 ENCOUNTER — Telehealth: Payer: Self-pay | Admitting: Cardiovascular Disease

## 2023-11-10 DIAGNOSIS — M25552 Pain in left hip: Secondary | ICD-10-CM | POA: Diagnosis not present

## 2023-11-10 DIAGNOSIS — M1711 Unilateral primary osteoarthritis, right knee: Secondary | ICD-10-CM | POA: Diagnosis not present

## 2023-11-10 NOTE — Telephone Encounter (Signed)
 Pt c/o medication issue:  1. Name of Medication:   Meloxicam, 7.5 mg  2. How are you currently taking this medication (dosage and times per day)?   Not taking as yet  3. Are you having a reaction (difficulty breathing--STAT)?   4. What is your medication issue?   Patient stated his orthopedic doctor recommended he take this medication for pain but he wants Dr. Allena advice on this medication or an alternate medication.

## 2023-11-11 NOTE — Telephone Encounter (Signed)
 [name], [name], [facility]  You6 hours ago (8:21 AM)   May increase BP and exacerbate underlying CAD. If patient uses recommend using the lowest dose for shortest period of time.   Called and spoke with patient about pharmacists comments. He verbalized understanding and appreciative for call back.

## 2024-01-06 DIAGNOSIS — R3915 Urgency of urination: Secondary | ICD-10-CM | POA: Diagnosis not present

## 2024-01-13 DIAGNOSIS — N401 Enlarged prostate with lower urinary tract symptoms: Secondary | ICD-10-CM | POA: Diagnosis not present

## 2024-01-13 DIAGNOSIS — N35011 Post-traumatic bulbous urethral stricture: Secondary | ICD-10-CM | POA: Diagnosis not present

## 2024-01-13 DIAGNOSIS — C61 Malignant neoplasm of prostate: Secondary | ICD-10-CM | POA: Diagnosis not present

## 2024-01-13 DIAGNOSIS — N3943 Post-void dribbling: Secondary | ICD-10-CM | POA: Diagnosis not present

## 2024-01-19 DIAGNOSIS — H524 Presbyopia: Secondary | ICD-10-CM | POA: Diagnosis not present

## 2024-01-26 DIAGNOSIS — M5416 Radiculopathy, lumbar region: Secondary | ICD-10-CM | POA: Diagnosis not present

## 2024-01-26 DIAGNOSIS — M48062 Spinal stenosis, lumbar region with neurogenic claudication: Secondary | ICD-10-CM | POA: Diagnosis not present

## 2024-02-07 ENCOUNTER — Telehealth: Payer: Self-pay | Admitting: Family Medicine

## 2024-02-07 ENCOUNTER — Ambulatory Visit (INDEPENDENT_AMBULATORY_CARE_PROVIDER_SITE_OTHER): Admitting: Family Medicine

## 2024-02-07 ENCOUNTER — Encounter: Payer: Self-pay | Admitting: Family Medicine

## 2024-02-07 ENCOUNTER — Other Ambulatory Visit: Payer: Self-pay

## 2024-02-07 ENCOUNTER — Other Ambulatory Visit: Payer: Self-pay | Admitting: Family Medicine

## 2024-02-07 ENCOUNTER — Ambulatory Visit: Payer: Self-pay

## 2024-02-07 VITALS — BP 121/73 | HR 69 | Temp 97.2°F | Ht 68.0 in | Wt 164.2 lb

## 2024-02-07 DIAGNOSIS — G8929 Other chronic pain: Secondary | ICD-10-CM

## 2024-02-07 DIAGNOSIS — M1990 Unspecified osteoarthritis, unspecified site: Secondary | ICD-10-CM | POA: Diagnosis not present

## 2024-02-07 DIAGNOSIS — L03011 Cellulitis of right finger: Secondary | ICD-10-CM

## 2024-02-07 DIAGNOSIS — M79644 Pain in right finger(s): Secondary | ICD-10-CM

## 2024-02-07 MED ORDER — DOXYCYCLINE HYCLATE 100 MG PO TABS: 100.0000 mg | ORAL_TABLET | Freq: Two times a day (BID) | ORAL | 0 refills | Status: DC

## 2024-02-07 NOTE — Telephone Encounter (Signed)
 Copied from CRM [id number]. Topic: Clinical - Prescription Issue >> Feb 07, 2024  2:50 PM Dyann Glaser G wrote: Reason for CRM: PT CALLING BACK TO SEE IF [facility] (doxycycline  (VIBRA -TABS) 100 MG tablet)

## 2024-02-07 NOTE — Telephone Encounter (Signed)
 Pt saw Dr. Daneil Dunker, I have resubmitted rx to local pharmacy as requested.

## 2024-02-07 NOTE — Progress Notes (Signed)
   [name] [name] is a 83 y.o. male who presents today for an office visit.  Assessment/Plan:  New/Acute Problems: Abrasion/cellulitis Abrasion on right thumb due to repetitive friction from bowling.  We are addressing the repetitive trauma s below.  There is concern for cellulitis to the area giving have surrounding erythema, pain, edema, and drainage.  Will start doxycycline  for this.  He can use topical antibiotic ointment as well.  He will let us  know if not improving   Chronic Problems Addressed Today: Osteoarthritis  Patient with significant joint hypertrophy on exam, especially at his first interphalangeal joint on the right hand.  Discussed with patient that this likely is causing significant friction with falling which will cause his abrasions and blistering as above.  We did discuss strategies to avoid friction in the area.  He has tried using bowlers tape with some improvement.  He is interested in seeing orthopedics again to see if there is any other things that can be done for this.  We did discuss imaging today however he can have this done with orthopedics.  Will place referral today.  He can use over-the-counter meds as needed to manage pain.     Subjective:  HPI:  See A/P for status of chronic conditions.  Patient is here today with right thumb pain.  This has been going on for a couple of months.  Has known osteoarthritis in his right hand.  He is an avid Teacher, music and over the last couple of months has been dealing with a blister on the dorsal aspect of the right first interphalangeal joint.  He avoided bowling for a couple of weeks to let it heal however bowled again recently and really exacerbated the area.  He currently has redness and swelling to the top part of his right thumb.  No specific treatments tried.       Objective:  Physical Exam: BP 121/73   Pulse 69   Temp (!) 97.2 F (36.2 C) (Temporal)   Ht 5\' 8"  (1.727 m)   Wt 164 lb 3.2 oz (74.5 kg)   SpO2 98%    BMI 24.97 kg/m   Gen: No acute distress, resting comfortably MUSCULOSKELETAL: - Right Hand: Significant joint hypertrophy noted throughout all joints especially first interphalangeal joint.  Approximately 3 to 4 mm ulcerated abrasion on right dorsal first interphalangeal joint with surrounding erythema.  Serous drainage present. Neuro: Grossly normal, moves all extremities Psych: Normal affect and thought content      Chamika Cunanan M. Daneil Dunker, MD 02/07/2024 11:56 AM

## 2024-02-07 NOTE — Telephone Encounter (Signed)
  Chief Complaint: blister from bowling on thumb, R Symptoms: pain when bowling, wound present for 2 months, won't heal Frequency: 2 months ongoing Pertinent Negatives: Patient denies fever, numbness/tingling, DM2 Disposition: [] ED /[] Urgent Care (no appt availability in office) / [x] Appointment(In office/virtual)/ []  [place] [facility]/ [] Home Care/ [] Refused Recommended Disposition /[] [place] [facility]/ []  Follow-up with PCP Additional Notes: Pt states that he has a blister on his thumb, believes that it is d/t bowling. Pt states that it has been ongoing for about 2 months, pt states that the blister is open, that there is some redness around the wound. Pt denies drainage.    Reason for Disposition  [1] Looks infected (spreading redness, red streak, pus) AND [2] no fever  Answer Assessment - Initial Assessment Questions 1. APPEARANCE of BLISTER: "What does it look like?"     Round, swollen, red, open, 1/8" circle 2. SIZE: "How large is the blister?" (inches, cm or compare to coins)     Blister itself is 1/8" 3. LOCATION: "Where are the blisters located?"      R, back of the thumb 4. WHEN: "When did the blister happen?"     2 months ago,  5. CAUSE: "What do you think caused the blister?"     bowling 6. PAIN: "Does it hurt?" If Yes, ask: "How bad is the pain?"  (Scale 1-10; or mild, moderate, severe)     10 when bowling 7. OTHER SYMPTOMS: "Do you have any other symptoms?" (e.g., fever)     denies  Protocols used: Blister - Foot and Hand-A-AH

## 2024-02-07 NOTE — Telephone Encounter (Signed)
 Appt today

## 2024-02-07 NOTE — Telephone Encounter (Signed)
 Copied from CRM #[id number]. Topic: Clinical - Prescription Issue >> Feb 07, 2024  1:25 PM Jenice Mitts wrote: Reason for CRM: Patient is calling because his medication was sent to [facility]. [id number] [place] [place] - [address] Phone: [phone] Fax: [phone]

## 2024-02-07 NOTE — Patient Instructions (Signed)
 It was very nice to see you today!  Please start the doxycycline .  Please try to avoid friction to the top of your thumb.  I will place a referral for you to see Dr. Aloha Arnold to see if there is anything that can be done for the arthritis in your hand.  Return if symptoms worsen or fail to improve.   Take care, Dr Daneil Dunker  PLEASE NOTE:  If you had any lab tests, please let us  know if you have not heard back within a few days. You may see your results on mychart before we have a chance to review them but we will give you a call once they are reviewed by us .   If we ordered any referrals today, please let us  know if you have not heard from their office within the next week.   If you had any urgent prescriptions sent in today, please check with the pharmacy within an hour of our visit to make sure the prescription was transmitted appropriately.   Please try these tips to maintain a healthy lifestyle:  Eat at least 3 REAL meals and 1-2 snacks per day.  Aim for no more than 5 hours between eating.  If you eat breakfast, please do so within one hour of getting up.   Each meal should contain half fruits/vegetables, one quarter protein, and one quarter carbs (no bigger than a computer mouse)  Cut down on sweet beverages. This includes juice, soda, and sweet tea.   Drink at least 1 glass of water  with each meal and aim for at least 8 glasses per day  Exercise at least 150 minutes every week.

## 2024-02-09 DIAGNOSIS — M48062 Spinal stenosis, lumbar region with neurogenic claudication: Secondary | ICD-10-CM | POA: Diagnosis not present

## 2024-02-15 DIAGNOSIS — M79644 Pain in right finger(s): Secondary | ICD-10-CM | POA: Diagnosis not present

## 2024-03-03 ENCOUNTER — Other Ambulatory Visit: Payer: Self-pay | Admitting: Family Medicine

## 2024-03-12 DIAGNOSIS — M67441 Ganglion, right hand: Secondary | ICD-10-CM | POA: Diagnosis not present

## 2024-03-12 DIAGNOSIS — M19041 Primary osteoarthritis, right hand: Secondary | ICD-10-CM | POA: Diagnosis not present

## 2024-03-13 ENCOUNTER — Ambulatory Visit (INDEPENDENT_AMBULATORY_CARE_PROVIDER_SITE_OTHER): Payer: Medicare HMO

## 2024-03-13 VITALS — Ht 68.0 in | Wt 164.0 lb

## 2024-03-13 DIAGNOSIS — M65941 Unspecified synovitis and tenosynovitis, right hand: Secondary | ICD-10-CM | POA: Diagnosis not present

## 2024-03-13 DIAGNOSIS — M1811 Unilateral primary osteoarthritis of first carpometacarpal joint, right hand: Secondary | ICD-10-CM | POA: Diagnosis not present

## 2024-03-13 DIAGNOSIS — Z0000 Encounter for general adult medical examination without abnormal findings: Secondary | ICD-10-CM | POA: Diagnosis not present

## 2024-03-13 DIAGNOSIS — M24041 Loose body in right finger joint(s): Secondary | ICD-10-CM | POA: Diagnosis not present

## 2024-03-13 DIAGNOSIS — S66211A Strain of extensor muscle, fascia and tendon of right thumb at wrist and hand level, initial encounter: Secondary | ICD-10-CM | POA: Diagnosis not present

## 2024-03-13 DIAGNOSIS — R2231 Localized swelling, mass and lump, right upper limb: Secondary | ICD-10-CM | POA: Diagnosis not present

## 2024-03-13 DIAGNOSIS — M67843 Other specified disorders of tendon, right hand: Secondary | ICD-10-CM | POA: Diagnosis not present

## 2024-03-13 DIAGNOSIS — M19041 Primary osteoarthritis, right hand: Secondary | ICD-10-CM | POA: Diagnosis not present

## 2024-03-13 DIAGNOSIS — M65841 Other synovitis and tenosynovitis, right hand: Secondary | ICD-10-CM | POA: Diagnosis not present

## 2024-03-13 DIAGNOSIS — M25741 Osteophyte, right hand: Secondary | ICD-10-CM | POA: Diagnosis not present

## 2024-03-13 NOTE — Patient Instructions (Signed)
 Mr. [name] [name] , Thank you for taking time out of your busy schedule to complete your Annual Wellness Visit with me. I enjoyed our conversation and look forward to speaking with you again next year. I, as well as your care team,  appreciate your ongoing commitment to your health goals. Please review the following plan we discussed and let me know if I can assist you in the future. Your Game plan/ To Do List    Referrals: If you haven't heard from the office you've been referred to, please reach out to them at the phone provided.   Follow up Visits: Next Medicare AWV with our clinical staff: 04/17/25   Have you seen your provider in the last 6 months (3 months if uncontrolled diabetes)? Yes Next Office Visit with your provider: 04/24/24  Clinician Recommendations:  Aim for 30 minutes of exercise or brisk walking, 6-8 glasses of water , and 5 servings of fruits and vegetables each day.       This is a list of the screening recommended for you and due dates:  Health Maintenance  Topic Date Due   COVID-19 Vaccine (6 - Pfizer risk 2024-25 season) 01/03/2024   Medicare Annual Wellness Visit  03/07/2024   Flu Shot  05/04/2024   Colon Cancer Screening  02/11/2025   DTaP/Tdap/Td vaccine (3 - Td or Tdap) 04/16/2032   Pneumonia Vaccine  Completed   Zoster (Shingles) Vaccine  Completed   HPV Vaccine  Aged Out   Meningitis B Vaccine  Aged Out   Hepatitis C Screening  Discontinued    Advanced directives: (Copy Requested) Please bring a copy of your health care power of attorney and living will to the office to be added to your chart at your convenience. You can mail to [facility] [address] [place], [place] [address] or email to [email or web address] Advance Care Planning is important because it:  [x]  Makes sure you receive the medical care that is consistent with your values, goals, and preferences  [x]  It provides guidance to your family and loved ones and reduces their  decisional burden about whether or not they are making the right decisions based on your wishes.  Follow the link provided in your after visit summary or read over the paperwork we have mailed to you to help you started getting your Advance Directives in place. If you need assistance in completing these, please reach out to us  so that we can help you!  See attachments for Preventive Care and Fall Prevention Tips.

## 2024-03-13 NOTE — Progress Notes (Signed)
 Subjective:   [name] [name] is a 83 y.o. who presents for a Medicare Wellness preventive visit.  As a reminder, Annual Wellness Visits don't include a physical exam, and some assessments may be limited, especially if this visit is performed virtually. We may recommend an in-person follow-up visit with your provider if needed.  Visit Complete: Virtual I connected with  [name] [name] on 03/13/24 by a audio enabled telemedicine application and verified that I am speaking with the correct person using two identifiers.  Patient Location: Home  Provider Location: Office/Clinic  I discussed the limitations of evaluation and management by telemedicine. The patient expressed understanding and agreed to proceed.  Vital Signs: Because this visit was a virtual/telehealth visit, some criteria may be missing or patient reported. Any vitals not documented were not able to be obtained and vitals that have been documented are patient reported.  VideoDeclined- This patient declined Librarian, academic. Therefore the visit was completed with audio only.  Persons Participating in Visit: Patient.  AWV Questionnaire: No: Patient Medicare AWV questionnaire was not completed prior to this visit.  Cardiac Risk Factors include: dyslipidemia;hypertension;advanced age (>88men, >54 women);male gender     Objective:     Today's Vitals   03/13/24 0826  Weight: 164 lb (74.4 kg)  Height: 5\' 8"  (1.727 m)   Body mass index is 24.94 kg/m.     03/13/2024    8:31 AM 08/16/2023    5:00 PM 08/09/2023    8:43 AM 04/11/2023   10:08 AM 03/08/2023    8:35 AM 03/05/2022    8:03 AM 01/12/2021   11:21 AM  Advanced Directives  Does Patient Have a Medical Advance Directive? Yes Yes Yes Yes Yes Yes Yes  Type of Estate agent of [place];Living will Healthcare Power of [place];Living will Healthcare Power of [name];Living will Healthcare Power of [place];Living  will Healthcare Power of [place];Living will Healthcare Power of [place];Living will Healthcare Power of [place];Living will  Does patient want to make changes to medical advance directive?  No - Patient declined     No - Patient declined  Copy of Healthcare Power of Attorney in Chart? No - copy requested   No - copy requested No - copy requested No - copy requested No - copy requested    Current Medications (verified) Outpatient Encounter Medications as of 03/13/2024  Medication Sig   atorvastatin  (LIPITOR) 40 MG tablet TAKE 1 TABLET EVERY DAY   Calcium  Carbonate-Vitamin D  (CALCIUM  + D PO) Take 1 tablet by mouth daily.   clopidogrel  (PLAVIX ) 75 MG tablet Take 75 mg by mouth daily.   glucosamine-chondroitin 500-400 MG tablet Take 1 tablet by mouth daily.   ibuprofen (ADVIL) 200 MG tablet Take 400 mg by mouth every 6 (six) hours as needed for moderate pain or mild pain.   Melatonin 10 MG TABS Take 10 mg by mouth at bedtime.   metoprolol  tartrate (LOPRESSOR ) 25 MG tablet TAKE 1 TABLET TWICE DAILY   Multiple Vitamin (MULTIVITAMIN PO) Take 1 tablet by mouth daily.   nitroGLYCERIN  (NITROSTAT ) 0.4 MG SL tablet Place 1 tablet (0.4 mg total) under the tongue every 5 (five) minutes as needed for chest pain.   Omega-3 Fatty Acids (FISH OIL) 1200 MG CAPS Take 1,200 mg by mouth daily.   pantoprazole  (PROTONIX ) 40 MG tablet TAKE 1 TABLET EVERY DAY   sildenafil  (VIAGRA ) 100 MG tablet Take 1 tablet (100 mg total) by mouth daily as needed for erectile  dysfunction.   tamsulosin  (FLOMAX ) 0.4 MG CAPS capsule Take 1 capsule (0.4 mg total) by mouth daily.   traZODone  (DESYREL ) 50 MG tablet TAKE 1/2 TO 1 TABLET AT BEDTIME AS NEEDED FOR SLEEP   trolamine salicylate (BLUE-EMU HEMP) 10 % cream Apply 1 Application topically daily as needed for muscle pain.   isosorbide  mononitrate (IMDUR ) 30 MG 24 hr tablet Take 0.5 tablets (15 mg total) by mouth daily.   [DISCONTINUED] doxycycline  (VIBRA -TABS) 100 MG tablet Take 1  tablet (100 mg total) by mouth 2 (two) times daily.   No facility-administered encounter medications on file as of 03/13/2024.    Allergies (verified) Hydrocodone and Simvastatin   History: Past Medical History:  Diagnosis Date   Anxiety    Arthritis    Coronary artery disease    status post PTCA and stenting of his left circumflex artery and right coronary artery   GERD (gastroesophageal reflux disease)    H/O heart artery stent    Hyperlipidemia    Hypertension    Pt denies   Reflux    omeprazole  40mg    Past Surgical History:  Procedure Laterality Date   Benign Cyst     on back   CARDIOVASCULAR STRESS TEST  05/09/2007   EF 64%   carpal tunnel left     CATARACT EXTRACTION     bilateral- april 15th with repeat 3 weeks later   CHOLECYSTECTOMY N/A 01/12/2021   Procedure: LAPAROSCOPIC CHOLECYSTECTOMY WITH INTRAOPERATIVE CHOLANGIOGRAM;  Surgeon: Oralee Billow, MD;  Location: [facility];  Service: General;  Laterality: N/A;  90MIN/RM2   CORONARY ANGIOPLASTY WITH STENT PLACEMENT     CORONARY BALLOON ANGIOPLASTY N/A 04/11/2023   Procedure: CORONARY BALLOON ANGIOPLASTY;  Surgeon: Kyra Phy, MD;  Location: [facility];  Service: Cardiovascular;  Laterality: N/A;   CORONARY PRESSURE/FFR STUDY Left 04/11/2023   Procedure: CORONARY PRESSURE/FFR STUDY;  Surgeon: Kyra Phy, MD;  Location: [facility];  Service: Cardiovascular;  Laterality: Left;   CYSTECTOMY     removed from finger   cystoscopy     CYSTOSCOPY WITH URETHRAL DILATATION N/A 08/16/2023   Procedure: CYSTOSCOPY WITH URETHRAL DILATATION;  Surgeon: Adelbert Homans, MD;  Location: [facility];  Service: Urology;  Laterality: N/A;   LEFT HEART CATH AND CORONARY ANGIOGRAPHY N/A 04/11/2023   Procedure: LEFT HEART CATH AND CORONARY ANGIOGRAPHY;  Surgeon: Kyra Phy, MD;  Location: [facility];  Service: Cardiovascular;  Laterality: N/A;   LIPOMA EXCISION     Back   right knee surgery      arthroscopyc 02/2010   TRANSURETHRAL RESECTION OF PROSTATE N/A 08/16/2023   Procedure: TRANSURETHRAL RESECTION OF THE PROSTATE (TURP);  Surgeon: Adelbert Homans, MD;  Location: [facility];  Service: Urology;  Laterality: N/A;  29 MINUTES   Family History  Problem Relation Age of Onset   Bladder Cancer Mother        passed age 54.    Heart attack Father        12 passed from this   Alcohol abuse Father    Social History   Socioeconomic History   Marital status: Widowed    Spouse name: Not on file   Number of children: Not on file   Years of education: Not on file   Highest education level: 12th grade  Occupational History   Not on file  Tobacco Use   Smoking status: Never   Smokeless tobacco: Never  Vaping Use   Vaping  status: Never Used  Substance and Sexual Activity   Alcohol use: No   Drug use: No   Sexual activity: Not on file  Other Topics Concern   Not on file  Social History Narrative   Widowed march 2023.  3 grown children, 3 grown stepkids. 9 grandkids. [place] 3 in 2024.    Lives in [place] now      Retired from [employer]. Over 46 years.    Social Drivers of Corporate investment banker Strain: Low Risk  (03/13/2024)   Overall Financial Resource Strain (CARDIA)    Difficulty of Paying Living Expenses: Not hard at all  Food Insecurity: No Food Insecurity (03/13/2024)   Hunger Vital Sign    Worried About Running Out of Food in the Last Year: Never true    Ran Out of Food in the Last Year: Never true  Transportation Needs: No Transportation Needs (03/13/2024)   PRAPARE - Administrator, Civil Service (Medical): No    Lack of Transportation (Non-Medical): No  Physical Activity: Sufficiently Active (03/13/2024)   Exercise Vital Sign    Days of Exercise per Week: 4 days    Minutes of Exercise per Session: 60 min  Stress: Stress Concern Present (03/13/2024)   [employer] of Occupational Health - Occupational Stress Questionnaire     Feeling of Stress : To some extent  Social Connections: Moderately Isolated (03/13/2024)   Social Connection and Isolation Panel [NHANES]    Frequency of Communication with Friends and Family: More than three times a week    Frequency of Social Gatherings with Friends and Family: Three times a week    Attends Religious Services: Never    Active Member of Clubs or Organizations: Yes    Attends Banker Meetings: 1 to 4 times per year    Marital Status: Widowed    Tobacco Counseling Counseling given: Not Answered    Clinical Intake:  Pre-visit preparation completed: Yes  Pain : No/denies pain     BMI - recorded: 24.94 Nutritional Status: BMI 25 -29 Overweight Nutritional Risks: None Diabetes: No  No results found for: "HGBA1C"   How often do you need to have someone help you when you read instructions, pamphlets, or other written materials from your doctor or pharmacy?: 1 - Never  Interpreter Needed?: No  Information entered by :: Lamont Pilsner, LPN   Activities of Daily Living     03/13/2024    8:28 AM 08/16/2023    5:00 PM  In your present state of health, do you have any difficulty performing the following activities:  Hearing? 0 0  Vision? 0 0  Difficulty concentrating or making decisions? 0 0  Walking or climbing stairs? 0   Dressing or bathing? 0   Doing errands, shopping? 0 0  Preparing Food and eating ? N   Using the Toilet? N   In the past six months, have you accidently leaked urine? N   Do you have problems with loss of bowel control? N   Managing your Medications? N   Managing your Finances? N   Housekeeping or managing your Housekeeping? N     Patient Care Team: Almira Jaeger, MD as PCP - General (Family Medicine) Nahser, Lela Purple, MD as PCP - Cardiology (Cardiology) Princella Brooklyn, OD as Consulting Physician (Optometry) Kearney Passer, MD as Consulting Physician (Physical Medicine and Rehabilitation) Adelbert Homans,  MD as Consulting Physician (Urology) Alvis Jourdain, MD as Consulting Physician (Gastroenterology) Lenna Quinton,  Emeterio Hansen, MD as Consulting Physician (Ophthalmology)  I have updated your Care Teams any recent Medical Services you may have received from other providers in the past year.     Assessment:    This is a routine wellness examination for Jeron.  Hearing/Vision screen Hearing Screening - Comments:: Pt denies any hearing issues  Vision Screening - Comments:: Wears rx glasses - up to date with routine eye exams with Princella Brooklyn at [facility] and Dr Demetrios Finders     Goals Addressed             This Visit's Progress    Patient Stated       Maintain health and activity        Depression Screen     03/13/2024    8:32 AM 02/07/2024   11:24 AM 04/19/2023    8:38 AM 03/08/2023    8:33 AM 03/05/2022    8:02 AM 04/08/2021   10:22 AM 01/28/2020   12:37 PM  PHQ 2/9 Scores  PHQ - 2 Score 1 0 0 0 0 0 0  PHQ- 9 Score   3        Fall Risk     03/13/2024    8:34 AM 02/07/2024   11:24 AM 04/19/2023    8:37 AM 03/07/2023    7:52 PM 03/05/2022    8:04 AM  Fall Risk   Falls in the past year? 0 0 1 0 1  Number falls in past yr: 0 0 0 0 1  Injury with Fall? 0 0 1 0 1  Comment     bruised hip  Risk for fall due to : No Fall Risks No Fall Risks History of fall(s) Impaired vision;Impaired balance/gait Impaired vision;Impaired balance/gait  Follow up Falls prevention discussed  Falls evaluation completed Falls prevention discussed Falls prevention discussed    MEDICARE RISK AT HOME:  Medicare Risk at Home Any stairs in or around the home?: No If so, are there any without handrails?: No Home free of loose throw rugs in walkways, pet beds, electrical cords, etc?: Yes Adequate lighting in your home to reduce risk of falls?: Yes Life alert?: No Use of a cane, walker or w/c?: No Grab bars in the bathroom?: Yes Shower chair or bench in shower?: Yes Elevated toilet seat or a handicapped toilet?:  No  TIMED UP AND GO:  Was the test performed?  No  Cognitive Function: 6CIT completed    03/02/2018   10:25 AM  MMSE - Mini Mental State Exam  Not completed: --        03/13/2024    8:36 AM 03/08/2023    8:37 AM 03/05/2022    8:06 AM 01/28/2020   12:37 PM 05/14/2019    9:13 AM  6CIT Screen  What Year? 0 points 0 points 0 points 0 points 0 points  What month? 0 points 0 points 0 points 0 points 0 points  What time? 0 points 0 points 0 points 0 points 0 points  Count back from 20 0 points 0 points 0 points 0 points 0 points  Months in reverse 0 points 0 points 0 points 0 points 0 points  Repeat phrase 0 points 0 points 0 points 0 points 0 points  Total Score 0 points 0 points 0 points 0 points 0 points    Immunizations Immunization History  Administered Date(s) Administered   Fluad Quad(high Dose 65+) 06/13/2019, 06/24/2020, 08/18/2021   Influenza, High Dose Seasonal PF 08/28/2017, 07/05/2023  Influenza,inj,Quad PF,6+ Mos 06/18/2022   Influenza-Unspecified 07/28/2016   Moderna Covid-19 Fall Seasonal Vaccine 73yrs & older 07/05/2023   PFIZER(Purple Top)SARS-COV-2 Vaccination 10/15/2019, 11/04/2019, 07/11/2020   Pfizer Covid-19 Vaccine Bivalent Booster 73yrs & up 09/15/2021   Pneumococcal Conjugate-13 08/16/2014   Pneumococcal Polysaccharide-23 07/08/2010   Tdap 07/08/2010, 04/16/2022   Zoster Recombinant(Shingrix) 04/13/2022, 06/18/2022    Screening Tests Health Maintenance  Topic Date Due   COVID-19 Vaccine (6 - Pfizer risk 2024-25 season) 01/03/2024   INFLUENZA VACCINE  05/04/2024   Colonoscopy  02/11/2025   Medicare Annual Wellness (AWV)  03/13/2025   DTaP/Tdap/Td (3 - Td or Tdap) 04/16/2032   Pneumonia Vaccine 57+ Years old  Completed   Zoster Vaccines- Shingrix  Completed   HPV VACCINES  Aged Out   Meningococcal B Vaccine  Aged Out   Hepatitis C Screening  Discontinued    Health Maintenance  Health Maintenance Due  Topic Date Due   COVID-19 Vaccine (6 -  Pfizer risk 2024-25 season) 01/03/2024   Health Maintenance Items Addressed: See Nurse Notes at the end of this note  Additional Screening:  Vision Screening: Recommended annual ophthalmology exams for early detection of glaucoma and other disorders of the eye. Would you like a referral to an eye doctor? No    Dental Screening: Recommended annual dental exams for proper oral hygiene  Community Resource Referral / Chronic Care Management: CRR required this visit?  No   CCM required this visit?  No   Plan:    I have personally reviewed and noted the following in the patient's chart:   Medical and social history Use of alcohol, tobacco or illicit drugs  Current medications and supplements including opioid prescriptions. Patient is not currently taking opioid prescriptions. Functional ability and status Nutritional status Physical activity Advanced directives List of other physicians Hospitalizations, surgeries, and ER visits in previous 12 months Vitals Screenings to include cognitive, depression, and falls Referrals and appointments  In addition, I have reviewed and discussed with patient certain preventive protocols, quality metrics, and best practice recommendations. A written personalized care plan for preventive services as well as general preventive health recommendations were provided to patient.   Bruno Capri, LPN   3/66/4403   After Visit Summary: (MyChart) Due to this being a telephonic visit, the after visit summary with patients personalized plan was offered to patient via MyChart   Notes: Nothing significant to report at this time.

## 2024-03-22 ENCOUNTER — Other Ambulatory Visit: Payer: Self-pay | Admitting: Physician Assistant

## 2024-03-26 DIAGNOSIS — M79644 Pain in right finger(s): Secondary | ICD-10-CM | POA: Diagnosis not present

## 2024-04-10 ENCOUNTER — Other Ambulatory Visit: Payer: Self-pay

## 2024-04-10 MED ORDER — ISOSORBIDE MONONITRATE ER 30 MG PO TB24: 15.0000 mg | ORAL_TABLET | Freq: Every day | ORAL | 0 refills | Status: DC

## 2024-04-24 ENCOUNTER — Ambulatory Visit: Payer: Self-pay | Admitting: Family Medicine

## 2024-04-24 ENCOUNTER — Encounter: Payer: Self-pay | Admitting: Family Medicine

## 2024-04-24 ENCOUNTER — Ambulatory Visit: Payer: Medicare HMO | Admitting: Family Medicine

## 2024-04-24 VITALS — BP 116/68 | HR 68 | Temp 97.3°F | Ht 68.0 in | Wt 159.4 lb

## 2024-04-24 DIAGNOSIS — R42 Dizziness and giddiness: Secondary | ICD-10-CM

## 2024-04-24 DIAGNOSIS — Z0000 Encounter for general adult medical examination without abnormal findings: Secondary | ICD-10-CM

## 2024-04-24 DIAGNOSIS — Z1283 Encounter for screening for malignant neoplasm of skin: Secondary | ICD-10-CM

## 2024-04-24 DIAGNOSIS — I2510 Atherosclerotic heart disease of native coronary artery without angina pectoris: Secondary | ICD-10-CM | POA: Diagnosis not present

## 2024-04-24 DIAGNOSIS — I10 Essential (primary) hypertension: Secondary | ICD-10-CM | POA: Diagnosis not present

## 2024-04-24 DIAGNOSIS — E785 Hyperlipidemia, unspecified: Secondary | ICD-10-CM

## 2024-04-24 DIAGNOSIS — I700 Atherosclerosis of aorta: Secondary | ICD-10-CM | POA: Diagnosis not present

## 2024-04-24 DIAGNOSIS — E538 Deficiency of other specified B group vitamins: Secondary | ICD-10-CM

## 2024-04-24 DIAGNOSIS — C61 Malignant neoplasm of prostate: Secondary | ICD-10-CM

## 2024-04-24 DIAGNOSIS — E559 Vitamin D deficiency, unspecified: Secondary | ICD-10-CM | POA: Diagnosis not present

## 2024-04-24 DIAGNOSIS — E010 Iodine-deficiency related diffuse (endemic) goiter: Secondary | ICD-10-CM | POA: Diagnosis not present

## 2024-04-24 LAB — CBC WITH DIFFERENTIAL/PLATELET
Basophils Absolute: 0.1 K/uL (ref 0.0–0.1)
Basophils Relative: 1.2 % (ref 0.0–3.0)
Eosinophils Absolute: 0.1 K/uL (ref 0.0–0.7)
Eosinophils Relative: 1.4 % (ref 0.0–5.0)
HCT: 41.9 % (ref 39.0–52.0)
Hemoglobin: 14.4 g/dL (ref 13.0–17.0)
Lymphocytes Relative: 26.9 % (ref 12.0–46.0)
Lymphs Abs: 1.4 K/uL (ref 0.7–4.0)
MCHC: 34.2 g/dL (ref 30.0–36.0)
MCV: 90.3 fl (ref 78.0–100.0)
Monocytes Absolute: 0.6 K/uL (ref 0.1–1.0)
Monocytes Relative: 10.8 % (ref 3.0–12.0)
Neutro Abs: 3.1 K/uL (ref 1.4–7.7)
Neutrophils Relative %: 59.7 % (ref 43.0–77.0)
Platelets: 226 K/uL (ref 150.0–400.0)
RBC: 4.64 Mil/uL (ref 4.22–5.81)
RDW: 14.1 % (ref 11.5–15.5)
WBC: 5.2 K/uL (ref 4.0–10.5)

## 2024-04-24 LAB — COMPREHENSIVE METABOLIC PANEL WITH GFR
ALT: 26 U/L (ref 0–53)
AST: 31 U/L (ref 0–37)
Albumin: 4.2 g/dL (ref 3.5–5.2)
Alkaline Phosphatase: 43 U/L (ref 39–117)
BUN: 21 mg/dL (ref 6–23)
CO2: 29 meq/L (ref 19–32)
Calcium: 9.3 mg/dL (ref 8.4–10.5)
Chloride: 104 meq/L (ref 96–112)
Creatinine, Ser: 0.67 mg/dL (ref 0.40–1.50)
GFR: 87.96 mL/min (ref >60.00)
Glucose, Bld: 104 mg/dL — ABNORMAL HIGH (ref 70–99)
Potassium: 4.4 meq/L (ref 3.5–5.1)
Sodium: 138 meq/L (ref 135–145)
Total Bilirubin: 0.5 mg/dL (ref 0.2–1.2)
Total Protein: 6.3 g/dL (ref 6.0–8.3)

## 2024-04-24 LAB — VITAMIN D 25 HYDROXY (VIT D DEFICIENCY, FRACTURES): VITD: 40.84 ng/mL (ref 30.00–100.00)

## 2024-04-24 LAB — PSA, MEDICARE: PSA: 0.51 ng/mL (ref 0.10–4.00)

## 2024-04-24 LAB — GLUCOSE, POCT (MANUAL RESULT ENTRY): POC Glucose: 108 mg/dL — AB (ref 70–99)

## 2024-04-24 LAB — LIPID PANEL
Cholesterol: 110 mg/dL (ref 0–200)
HDL: 44.2 mg/dL (ref >39.00)
LDL Cholesterol: 56 mg/dL (ref 0–99)
NonHDL: 65.67
Total CHOL/HDL Ratio: 2
Triglycerides: 50 mg/dL (ref 0.0–149.0)
VLDL: 10 mg/dL (ref 0.0–40.0)

## 2024-04-24 LAB — TSH: TSH: 0.4 u[IU]/mL (ref 0.35–5.50)

## 2024-04-24 LAB — VITAMIN B12: Vitamin B-12: 492 pg/mL (ref 211–911)

## 2024-04-24 NOTE — Patient Instructions (Addendum)
 Great idea by your daughter- We have placed a referral for you today to dermatology Dr. Alm (may take some time to get in)- please call their # if you do not hear within a week (may be listed below or you may see mychart message within a few days with #).   blood pressure running lower 100/64 initially- we had him drink some water  and improved to 116/68 and blood sugar was over 100 so no hypoglycemia. He was feeling better by end of visit- encouraged to hydrate throughout day and eat after this and if worsening symptoms return to care  LABS in room laying down If you have mychart- we will send your results within 3 business days of us  receiving them.  If you do not have mychart- we will call you about results within 5 business days of us  receiving them.  *please also note that you will see labs on mychart as soon as they post. I will later go in and write notes on them- will say notes from Dr. Katrinka   Recommended follow up: Return in about 6 months (around 10/25/2024) for followup or sooner if needed.Schedule b4 you leave.

## 2024-04-24 NOTE — Progress Notes (Signed)
 Phone: [phone]   Subjective:  Patient presents today for their annual physical. Chief complaint-noted.   See problem oriented charting- ROS- full  review of systems was completed and negative  Per full ROS sheet completed by patient except for topics noted under acute/chronic concerns  The following were reviewed and entered/updated in epic: Past Medical History:  Diagnosis Date   Anxiety    Arthritis    Cancer (HCC) 01/2023   Coronary artery disease    status post PTCA and stenting of his left circumflex artery and right coronary artery   GERD (gastroesophageal reflux disease)    H/O heart artery stent    Hyperlipidemia    Hypertension    Pt denies   Reflux    omeprazole  40mg    Patient Active Problem List   Diagnosis Date Noted   CAD (coronary artery disease) 06/21/2011    Priority: High   Aortic atherosclerosis (HCC) 12/17/2019    Priority: Medium    Insomnia 04/11/2019    Priority: Medium    Claudication of lower extremity (HCC) 10/24/2018    Priority: Medium    BPH (benign prostatic hyperplasia) 09/06/2016    Priority: Medium    Essential hypertension 08/14/2016    Priority: Medium    Hyperlipidemia 06/21/2011    Priority: Medium    Cholelithiasis with chronic cholecystitis 01/12/2021    Priority: Low   Allergic rhinitis 01/28/2020    Priority: Low   Plantar fasciitis of left foot 06/21/2018    Priority: Low   Anxiety state 09/06/2016    Priority: Low   History of adenomatous polyp of colon 08/14/2016    Priority: Low   Vitamin D  deficiency 08/14/2016    Priority: Low   GERD (gastroesophageal reflux disease)     Priority: Low   BPH with urinary obstruction 08/16/2023   Hematoma of right forearm 04/12/2023   Thyromegaly 04/08/2021   Past Surgical History:  Procedure Laterality Date   Benign Cyst     on back   CARDIOVASCULAR STRESS TEST  05/09/2007   EF 64%   carpal tunnel left     CATARACT EXTRACTION     bilateral- april 15th with repeat 3  weeks later   CHOLECYSTECTOMY N/A 01/12/2021   Procedure: LAPAROSCOPIC CHOLECYSTECTOMY WITH INTRAOPERATIVE CHOLANGIOGRAM;  Surgeon: Eletha Boas, MD;  Location: [facility];  Service: General;  Laterality: N/A;  90MIN/RM2   CORONARY ANGIOPLASTY WITH STENT PLACEMENT     CORONARY BALLOON ANGIOPLASTY N/A 04/11/2023   Procedure: CORONARY BALLOON ANGIOPLASTY;  Surgeon: Wendel Lurena POUR, MD;  Location: [facility];  Service: Cardiovascular;  Laterality: N/A;   CORONARY PRESSURE/FFR STUDY Left 04/11/2023   Procedure: CORONARY PRESSURE/FFR STUDY;  Surgeon: Wendel Lurena POUR, MD;  Location: [facility];  Service: Cardiovascular;  Laterality: Left;   CYSTECTOMY     removed from finger   cystoscopy     CYSTOSCOPY WITH URETHRAL DILATATION N/A 08/16/2023   Procedure: CYSTOSCOPY WITH URETHRAL DILATATION;  Surgeon: Devere Lonni Righter, MD;  Location: [facility];  Service: Urology;  Laterality: N/A;   EYE SURGERY  2021  ?   LEFT HEART CATH AND CORONARY ANGIOGRAPHY N/A 04/11/2023   Procedure: LEFT HEART CATH AND CORONARY ANGIOGRAPHY;  Surgeon: Wendel Lurena POUR, MD;  Location: [facility];  Service: Cardiovascular;  Laterality: N/A;   LIPOMA EXCISION     Back   right knee surgery     arthroscopyc 02/2010   TRANSURETHRAL RESECTION OF PROSTATE N/A 08/16/2023   Procedure:  TRANSURETHRAL RESECTION OF THE PROSTATE (TURP);  Surgeon: Devere Lonni Righter, MD;  Location: [facility];  Service: Urology;  Laterality: N/A;  31 MINUTES    Family History  Problem Relation Age of Onset   Bladder Cancer Mother        passed age 4.    Cancer Mother    Early death Mother    Heart attack Father        52 passed from this   Alcohol abuse Father     Medications- reviewed and updated Current Outpatient Medications  Medication Sig Dispense Refill   ASPIRIN  81 PO Take 81 mg by mouth daily.     atorvastatin  (LIPITOR) 40 MG tablet TAKE 1 TABLET EVERY DAY 90 tablet 3   Calcium  Carbonate-Vitamin D   (CALCIUM  + D PO) Take 1 tablet by mouth daily.     clopidogrel  (PLAVIX ) 75 MG tablet Take 75 mg by mouth daily.     glucosamine-chondroitin 500-400 MG tablet Take 1 tablet by mouth daily.     ibuprofen (ADVIL) 200 MG tablet Take 400 mg by mouth every 6 (six) hours as needed for moderate pain or mild pain.     isosorbide  mononitrate (IMDUR ) 30 MG 24 hr tablet Take 0.5 tablets (15 mg total) by mouth daily. 45 tablet 0   Melatonin 10 MG TABS Take 10 mg by mouth at bedtime.     metoprolol  tartrate (LOPRESSOR ) 25 MG tablet TAKE 1 TABLET TWICE DAILY 180 tablet 3   Multiple Vitamin (MULTIVITAMIN PO) Take 1 tablet by mouth daily.     nitroGLYCERIN  (NITROSTAT ) 0.4 MG SL tablet Place 1 tablet (0.4 mg total) under the tongue every 5 (five) minutes as needed for chest pain. 25 tablet 6   Omega-3 Fatty Acids (FISH OIL) 1200 MG CAPS Take 1,200 mg by mouth daily.     pantoprazole  (PROTONIX ) 40 MG tablet TAKE 1 TABLET EVERY DAY 90 tablet 3   sildenafil  (VIAGRA ) 100 MG tablet Take 1 tablet (100 mg total) by mouth daily as needed for erectile dysfunction. 30 tablet 3   tamsulosin  (FLOMAX ) 0.4 MG CAPS capsule Take 1 capsule (0.4 mg total) by mouth daily. 90 capsule 3   traZODone  (DESYREL ) 50 MG tablet TAKE 1/2 TO 1 TABLET AT BEDTIME AS NEEDED FOR SLEEP 90 tablet 3   trolamine salicylate (BLUE-EMU HEMP) 10 % cream Apply 1 Application topically daily as needed for muscle pain.     No current facility-administered medications for this visit.    Allergies-reviewed and updated Allergies  Allergen Reactions   Hydrocodone Nausea And Vomiting   Simvastatin Other (See Comments)    Leg pain     Social History   Social History Narrative   Widowed march 2023.  3 grown children, 3 grown stepkids. 9 grandkids. [place] 3 in 2024.    Lives in [place] now      Retired from [employer]. Over 46 years.    Objective  Objective:  BP 116/68   Pulse 68   Temp (!) 97.3 F (36.3 C)   Ht 5' 8 (1.727 m)   Wt 159  lb 6.4 oz (72.3 kg)   SpO2 98%   BMI 24.24 kg/m  Gen: NAD, resting comfortably HEENT: Mucous membranes are moist. Oropharynx normal, some cerumen in ears- ongoing issue Neck: no thyromegaly CV: RRR no murmurs rubs or gallops Lungs: CTAB no crackles, wheeze, rhonchi Abdomen: soft/nontender/nondistended/normal bowel sounds. No rebound or guarding.  Ext: no edema Skin: warm, dry Neuro: grossly normal, moves  all extremities, PERRLA    Assessment and Plan  83 y.o. male presenting for annual physical.  Health Maintenance counseling: 1. Anticipatory guidance: Patient counseled regarding regular dental exams -q6 months, eye exams -yearly,  avoiding smoking and second hand smoke , limiting alcohol to 2 beverages per day - doesn't drink, no illicit drugs .   2. Risk factor reduction:  Advised patient of need for regular exercise and diet rich and fruits and vegetables to reduce risk of heart attack and stroke.  Exercise- active in yard but no other exercise- encouraged getting back in gym- had been in past.  Diet/weight management-mild weight loss but appetite lower Wt Readings from Last 3 Encounters:  04/24/24 159 lb 6.4 oz (72.3 kg)  03/13/24 164 lb (74.4 kg)  02/07/24 164 lb 3.2 oz (74.5 kg)  3. Immunizations/screenings/ancillary studies- consider fall COVID shot and otherwise up to date . Consider RSV in the fall Immunization History  Administered Date(s) Administered   Fluad Quad(high Dose 65+) 06/13/2019, 06/24/2020, 08/18/2021   Influenza, High Dose Seasonal PF 08/28/2017, 07/05/2023   Influenza,inj,Quad PF,6+ Mos 06/18/2022   Influenza-Unspecified 07/28/2016   Moderna Covid-19 Fall Seasonal Vaccine 85yrs & older 07/05/2023   PFIZER(Purple Top)SARS-COV-2 Vaccination 10/15/2019, 11/04/2019, 07/11/2020   Pfizer Covid-19 Vaccine Bivalent Booster 54yrs & up 09/15/2021   Pneumococcal Conjugate-13 08/16/2014   Pneumococcal Polysaccharide-23 07/08/2010   Tdap 07/08/2010, 04/16/2022    Zoster Recombinant(Shingrix) 04/13/2022, 06/18/2022   4. Prostate cancer screening- patient with low risk prostate cancer remains under active surveillance with [facility]-offered PSA today- opts in  Lab Results  Component Value Date   PSA 0.52 04/08/2021   PSA 0.74 10/20/2017   5. Colon cancer screening - 2021 and Dr. Rollin mentioned 5 year repeat with polyp history though will need to assess overall health at that time 6. Skin cancer screening- doesn't see dermatology- he is open today- refer to Dr. Alm. advised regular sunscreen use. Denies worrisome, changing, or new skin lesions.  7. Smoking associated screening (lung cancer screening, AAA screen 65-75, UA)- never smoker 8. STD screening - not dating/not sexually active  Status of chronic or acute concerns   # Social update-reports some anxiety about upcoming move of to a different one story home . Closing in August on new home- dealing with financial issues. Will live alone in 3 bedroom 2 bath with 2 car garage. They will do his yard for him  # Hand surgery with [facility]-he reports good healing from this earlier in the summer for mucous cyst on his thumb- has not been able to bowl which is a good outlet   #feeling lightheaded this Am- occasionally in last week- felt maybe anxiety related. Today fasting is worse for him- even noted a few floaters- we got him some water  and checked sugar -no chest pain or shortness of breath  -see hypertension section- improved with pushing fluids and blood pressure above  #CAD-follows with Dr. Alveta with history of stent - will be getting new doctor #hyperlipidemia #Claudication lower extremity #Aortic atherosclerosis S: Medication: atorvastatin  40 mg ,asa 81 mg with addition of Plavix  on 04/11/2023, metoprolol  25 mg twice daily, imdur  15 mg -Claudication in thighs with climbing stairs-not happening but not going upstairs  -Most recent catheterization 04/11/2023-unable to place stent-balloon  angioplasty completed with suboptimal result -Lipoprotein a not elevated - no chest pain or shortness of breath  Lab Results  Component Value Date   CHOL 103 03/23/2023   HDL 40 03/23/2023   LDLCALC 49  03/23/2023   LDLDIRECT 58.0 04/08/2021   TRIG 61 03/23/2023   CHOLHDL 2.6 03/23/2023   A/P: CAD-asymptomatic continue current medications - angina controlled with imdur   hyperlipidemia-at goal last year- update today  Claudication lower extremity-no recent issues but not going upstairs Aortic atherosclerosis (presumed stable)- LDL goal ideally <70 - at goal    # Low risk prostate cancer-follows with Dr. Devere under active surveillance #BPH S: Surgery: TURP late 2024 and discovered low volume of cancer as well Medication: Tamsulosin  0.4 mg  A/P: BPH doing ok but leakage after surgery. Prostate cancer under active surveillance   #hypertension S: medication: Metoprolol  25 mg twice daily, imdur  15 mg- took both today Home readings #s: doesn't check BP Readings from Last 3 Encounters:  04/24/24 116/68  02/07/24 121/73  10/25/23 110/70  A/P: blood pressure running lower 100/64 initially- we had him drink some water  and improved to 116/68 and blood sugar was over 100 so no hypoglycemia. He was feeling better by end of visit- encouraged to hydrate throughout day and eat after this and if worsening symptoms return to care  # GERD-with history of esophageal dilation with Dr. Luis in 2011 (no longer sees) S:Medication: Omeprazole  40 mg --> pantoprazole  40--> milligrams due to clopidogrel  B12 levels related to PPI use: Ordered 2023 and he takes a B complex every other day and B12 weekly  A/P: reasonable control- continue current medications  -check B12 with relative lows in past   #Vitamin D  deficiency S: Medication: Multivitamin plus calcium  plus vitamin D  Last vitamin D  Lab Results  Component Value Date   VD25OH 49.27 04/19/2023  A/P: hopefully stable- update vitamin D  today.  Continue current meds for now     #Insomnia-takes melatonin 5 mg and/or trazodone  50 mg- slightly worse lately with more anxiety with his move   #Thyromegaly-noted on imaging 10/31/2018-no nodules.  Will intermittently check TSH including today   #Low back pain- injections with [facility]-continues to follow with Dr. Darlis   #right knee pain- gel shots x 3 not helpful in 2024. Dr. Er[facility]- slightly better lately  #History of anxiety-on Paxil in the past- no recent treatment- offered therapy referral - but wants to see how he does with the move first. Could consider lexapro   Recommended follow up: Return in about 6 months (around 10/25/2024) for followup or sooner if needed.Schedule b4 you leave. Future Appointments  Date Time Provider Department Center  03/18/2025  8:40 AM [facility] ANNUAL WELLNESS VISIT 1 [facility]   Lab/Order associations: fasting   ICD-10-CM   1. Preventative health care  Z00.00     2. Coronary artery disease involving native coronary artery of native heart without angina pectoris  I25.10     3. Aortic atherosclerosis (HCC)  I70.0     4. Essential hypertension  I10     5. Hyperlipidemia, unspecified hyperlipidemia type  E78.5 Comprehensive metabolic panel with GFR    CBC with Differential/Platelet    Lipid panel    TSH    6. Vitamin D  deficiency  E55.9 VITAMIN D  25 Hydroxy (Vit-D Deficiency, Fractures)    7. Thyromegaly  E01.0 TSH    8. Skin cancer screening  Z12.83 Ambulatory referral to Dermatology    9. Prostate cancer (HCC)  C61 PSA, Medicare    10. B12 deficiency  E53.8 Vitamin B12    11. Light headed  R42 POCT Glucose (CBG)      No orders of the defined types were placed  in this encounter.   Return precautions advised.  Garnette Lukes, MD

## 2024-05-14 DIAGNOSIS — M48062 Spinal stenosis, lumbar region with neurogenic claudication: Secondary | ICD-10-CM | POA: Diagnosis not present

## 2024-05-30 DIAGNOSIS — M4807 Spinal stenosis, lumbosacral region: Secondary | ICD-10-CM | POA: Diagnosis not present

## 2024-05-30 DIAGNOSIS — C61 Malignant neoplasm of prostate: Secondary | ICD-10-CM | POA: Diagnosis not present

## 2024-05-30 DIAGNOSIS — C7652 Malignant neoplasm of left lower limb: Secondary | ICD-10-CM | POA: Diagnosis not present

## 2024-05-30 DIAGNOSIS — M48062 Spinal stenosis, lumbar region with neurogenic claudication: Secondary | ICD-10-CM | POA: Diagnosis not present

## 2024-06-05 ENCOUNTER — Ambulatory Visit: Attending: Internal Medicine | Admitting: Internal Medicine

## 2024-06-05 VITALS — BP 102/64 | HR 68 | Ht 68.0 in | Wt 157.2 lb

## 2024-06-05 DIAGNOSIS — E785 Hyperlipidemia, unspecified: Secondary | ICD-10-CM

## 2024-06-05 DIAGNOSIS — M25472 Effusion, left ankle: Secondary | ICD-10-CM | POA: Diagnosis not present

## 2024-06-05 DIAGNOSIS — I2510 Atherosclerotic heart disease of native coronary artery without angina pectoris: Secondary | ICD-10-CM

## 2024-06-05 MED ORDER — OMEPRAZOLE 40 MG PO CPDR: 40.0000 mg | DELAYED_RELEASE_CAPSULE | Freq: Every day | ORAL | 0 refills | Status: DC

## 2024-06-05 MED ORDER — CLOPIDOGREL BISULFATE 75 MG PO TABS: 75.0000 mg | ORAL_TABLET | Freq: Every day | ORAL | 3 refills | Status: DC

## 2024-06-05 NOTE — Progress Notes (Signed)
 Cardiology Office Note:  .    Date:  06/05/2024  ID:  [name] [name], DOB [birth year], MRN [id number] PCP: Katrinka Garnette KIDD, MD   HeartCare Providers Cardiologist:  Aleene Passe, MD (Inactive)     CC: Transition to new cardiologist  History of Present Illness: .    [name] [name] is a 83 y.o. male is an 83 year old male with coronary artery disease who presents to establish care after his previous cardiologist retired.  He has a history of coronary artery disease with previous percutaneous coronary interventions on his circumflex artery and right coronary artery. He has a chronic total occlusion of the right coronary artery and an 80% stenosis in the circumflex artery, identified in 2024. His last heart catheterization in July 2024 showed an 80% stenosis in the proximal circumflex artery, a 90% stenosis in the distal left anterior descending artery, and a mid-left main lesion that was RFR negative.  He has peripheral arterial disease and a history of leg claudication, which has improved over time. He experiences back and knee problems, for which he has received steroid injections, but he does not plan to undergo surgery. Swelling in his left ankle has been present for the past three to four months, with some recent decrease.  He is on dual antiplatelet therapy with Plavix  and aspirin , although he has stopped aspirin  due to bruising. He is on a low dose of isosorbide  mononitrate and metoprolol  for medical management of his coronary artery disease.  He is active at the [facility], having recently returned after a hiatus following his wife's passing. He typically goes in the morning around 7:30 AM. He also engages in bowling, although he took a brea  Discussed the use of AI scribe software for clinical note transcription with the patient, who gave verbal consent to proceed.  Relevant histories: .  Social  - Partner Status: widowed - Living Situation: lives alone - Patient is  active at the [employer] and engages in bowling as a hobby.  ROS: As per HPI.   Studies Reviewed: .     Cardiac Studies & Procedures   ______________________________________________________________________________________________ CARDIAC CATHETERIZATION  CARDIAC CATHETERIZATION 04/11/2023  Conclusion   Prox RCA lesion is 100% stenosed.   Prox Cx to Mid Cx lesion is 20% stenosed.   Prox Cx lesion is 80% stenosed.   Dist LAD lesion is 99% stenosed.   Mid LM lesion is 60% stenosed.   Post intervention, there is a 80% residual stenosis.  1.  Very calcified right radial artery unable to accommodate sheath. 2.  Chronic total occlusion of proximal right coronary artery collateralized by the left circumflex. 3.  Moderate left main disease with an associated RFR value of 0.91 with moderate diffuse distal dilute disease of the LAD; the distal LAD should be treated medically and does not appear to be a good target for surgical revascularization. 4.  Focal 80% proximal left circumflex lesion with patent mid left circumflex stents.  The associated RFR was 0.71.  Despite multiple wires and a guide liner, the angulation from the left main/LAD into the circumflex could not be negotiated.  Therefore PCI was deferred and optimal medical therapy should be pursued. 5.  LVEDP of 9 mmHg.  Recommendation: Optimal medical therapy.  Findings Coronary Findings Diagnostic  Dominance: Right  Left Main Mid LM lesion is 60% stenosed.  Left Anterior Descending There is moderate diffuse disease throughout the vessel. Dist LAD lesion is 99% stenosed.  Left Circumflex  Prox Cx lesion is 80% stenosed. Prox Cx to Mid Cx lesion is 20% stenosed. The lesion was previously treated .  Right Coronary Artery Prox RCA lesion is 100% stenosed.  Third Right Posterolateral Branch Collaterals 3rd RPL filled by collaterals from 3rd Mrg.  Collaterals 3rd RPL filled by collaterals from 3rd  Mrg.  Intervention  Prox Cx lesion Angioplasty Post-Intervention Lesion Assessment The intervention was unsuccessful. Pre-interventional TIMI flow is 3. Post-intervention TIMI flow is 3. There is a 80% residual stenosis post intervention.   STRESS TESTS  MYOCARDIAL PERFUSION IMAGING 04/04/2023  Interpretation Summary   Lexiscan  stress shows no EKG changes   Myoview  scan shows a large defect in the inferior wall (base, mid, distal), inferolateral wall (base, mid, distal) inferoseptal wall (base) and apex.  No change in the rest images   Consistent with large area of scar and possible soft tissue attenuation (diaphragm)  No ischemia.   Nuclear stress EF: 48 %.   COmpared to images from scan from 05/09/2007, these changes are new            ______________________________________________________________________________________________      Physical Exam:    VS:  BP 102/64   Pulse 68   Ht 5' 8 (1.727 m)   Wt 157 lb 3.2 oz (71.3 kg)   SpO2 97%   BMI 23.90 kg/m    Wt Readings from Last 3 Encounters:  06/05/24 157 lb 3.2 oz (71.3 kg)  04/24/24 159 lb 6.4 oz (72.3 kg)  03/13/24 164 lb (74.4 kg)    Gen: no distress   Neck: No JVD Ears: bilateral Frank Sign Cardiac: No Rubs or Gallops, no murmur, RRR +2 radial pulses Respiratory: Clear to auscultation bilaterally, normal effort, normal  respiratory rate GI: Soft, nontender, non-distended  MS: Isolated left ankle  edema;  moves all extremities Integument: Skin feels warm Neuro:  At time of evaluation, alert and oriented to person/place/time/situation  Psych: Normal affect, patient feels ok   ASSESSMENT AND PLAN: .    An EKG was ordered for CAD and shows  Sinus rhythm with improved T wave flattening in inferior leads (06/05/2024)  Multivessel coronary artery disease, post-PCI, on medical management - Chronic multivessel coronary artery disease with previous PCI of the circumflex and RCA. Residual 80% stenosis in the  circumflex and 90% stenosis in the distal LAD. Medical management is ongoing. Blood pressure is low, limiting further aggressive medical therapy. Backup plan includes heart catheterization if significant angina develops. We reviewed the data on omeprazole  and plavix ., noting from Bouzinia et al: even though pharmacodynamic studies suggest that omeprazole  can attenuate the antiplatelet effect of clopidogrel , this interaction does not appear to translate into increased cardiovascular risk in patients treated with this combination. - Continue isosorbide  mononitrate and metoprolol  at current doses - Stop aspirin  - Continue Plavix  (clopidogrel ) 75 mg daily - Order high sensitivity CRP for secondary prevention - Order BNP to evaluate for cardiac etiology of ankle swelling - Instruct to go to the hospital if experiencing 10/10 chest pain  Hyperlipidemia in the setting of coronary artery disease - LDL is well-controlled at 55 mg/dL. Lp(a) is normal.  Continue current plan  Isolated left ankle swelling, evaluation for cardiac etiology Isolated left ankle swelling for 3-4 months. No signs of systemic fluid overload. Cardiac etiology is considered unlikely but BNP will be checked to rule out cardiac causes. Discussed that if BNP is elevated, a fluid pill may be considered for symptomatic relief. - Order BNP to evaluate for  cardiac etiology of ankle swelling - Communicate with primary care physician if BNP is elevated  Gastroesophageal reflux disease (GERD) GERD symptoms not well-controlled on pantoprazole . He prefers omeprazole  despite potential interaction with Plavix . No convincing evidence of adverse clinical outcomes from this interaction. Short-term transition to omeprazole  is considered reasonable. - Stop pantoprazole  - Start over-the-counter omeprazole  40 mg once daily; long term this should be managed by PCP  Will f/u with Orren Fabry PA-C in one year and for longitudinal f/u Will see me or my  team for worsening CP with plans for cath  Stanly Leavens, MD FASE [facility] Cardiologist [facility]  [address] [place], [address] [place], [place] [address] [phone]  9:33 AM

## 2024-06-05 NOTE — Patient Instructions (Signed)
 Medication Instructions:  Your physician has recommended you make the following change in your medication:  REMOVED: Aspirin  and Sildenafil  from your medication list REFILLED: clopidogrel  (Plavix )   STOP: pantoprazole  (Protonix )  START: omperazole (Prilosec) 40 mg once daily please have your Primary Care Team refill this medication  *If you need a refill on your cardiac medications before your next appointment, please call your pharmacy*  Lab Work: At [facility] on the 1st floor-- CRP, BNP  If you have labs (blood work) drawn today and your tests are completely normal, you will receive your results only by: MyChart Message (if you have MyChart) OR A paper copy in the mail If you have any lab test that is abnormal or we need to change your treatment, we will call you to review the results.  Testing/Procedures: NONE   Follow-Up: At [facility], you and your health needs are our priority.  As part of our continuing mission to provide you with exceptional heart care, our providers are all part of one team.  This team includes your primary Cardiologist (physician) and Advanced Practice Providers or APPs (Physician Assistants and Nurse Practitioners) who all work together to provide you with the care you need, when you need it.  Your next appointment:   1 year(s)  Provider:   Stanly Leavens, MD

## 2024-06-06 ENCOUNTER — Ambulatory Visit: Payer: Self-pay | Admitting: Internal Medicine

## 2024-06-06 LAB — HIGH SENSITIVITY CRP: CRP, High Sensitivity: 0.4 mg/L (ref 0.00–3.00)

## 2024-06-06 LAB — PRO B NATRIURETIC PEPTIDE: NT-Pro BNP: 293 pg/mL (ref 0–486)

## 2024-06-21 DIAGNOSIS — M48062 Spinal stenosis, lumbar region with neurogenic claudication: Secondary | ICD-10-CM | POA: Diagnosis not present

## 2024-06-21 DIAGNOSIS — M4310 Spondylolisthesis, site unspecified: Secondary | ICD-10-CM | POA: Diagnosis not present

## 2024-06-22 ENCOUNTER — Telehealth: Payer: Self-pay

## 2024-06-22 NOTE — Telephone Encounter (Signed)
   Patient Name: [name] [name]  DOB: 11-Jan-1941 MRN: [id number]  Primary Cardiologist: Aleene Passe, MD (Inactive)  Chart reviewed as part of pre-operative protocol coverage.  Patient was last seen in the office on 06/05/2024 by Dr. Arnetha. Per Dr. Santo, Patient with stable CAD since 2024; we de-escalated his anti-platelet regimen. Given his back pain and lack of cardiac symptoms, it is reasonable to hold plavix  and return when cleared by surgery.   At time of evaluation he was active at the [facility] with greater than 4 functional mets and, save for some isolated ankle swelling (with NT-proBNP WNL) had no cardiac symptoms. On EKG his TWI has improved.  He is an increased risk in the setting of CAD and PAD, but these conditions are well managed.  Unless there is a change in his clinical condition from our visit, it would be reasonable to proceed.  Per office protocol, he may hold Plavix  for 5 days prior to procedure. Please resume Plavix  as soon as possible postprocedure, at the discretion of the surgeon.   I will route this recommendation to the requesting party via Epic fax function and remove from pre-op pool.  Please call with questions.  Damien JAYSON Braver, NP 06/22/2024, 1:57 PM

## 2024-06-22 NOTE — Telephone Encounter (Signed)
   Pre-operative Risk Assessment    Patient Name: [name] [name]  DOB: 05/25/1941 MRN: [id number]   Date of last office visit: 06/05/24 [facility], MD Date of next office visit: NONE   Request for Surgical Clearance    Procedure:  L3-4, L4-5 LUNBAR FUSION  Date of Surgery:  Clearance TBD                                Surgeon:  DR VICTORY DELENA GUNNELS Surgeon's Group or Practice Name:  [facility] Phone number:  [phone] Fax number:  [phone]   Type of Clearance Requested:   - Medical  - Pharmacy:  Hold Clopidogrel  (Plavix )     Type of Anesthesia:  General    Additional requests/questions:    SignedLucie DELENA Ku   06/22/2024, 8:20 AM

## 2024-06-25 ENCOUNTER — Other Ambulatory Visit: Payer: Self-pay

## 2024-06-26 MED ORDER — ISOSORBIDE MONONITRATE ER 30 MG PO TB24: 15.0000 mg | ORAL_TABLET | Freq: Every day | ORAL | 3 refills | Status: DC

## 2024-07-17 DIAGNOSIS — H18593 Other hereditary corneal dystrophies, bilateral: Secondary | ICD-10-CM | POA: Diagnosis not present

## 2024-07-17 DIAGNOSIS — Z961 Presence of intraocular lens: Secondary | ICD-10-CM | POA: Diagnosis not present

## 2024-07-17 DIAGNOSIS — H40013 Open angle with borderline findings, low risk, bilateral: Secondary | ICD-10-CM | POA: Diagnosis not present

## 2024-07-17 DIAGNOSIS — H18413 Arcus senilis, bilateral: Secondary | ICD-10-CM | POA: Diagnosis not present

## 2024-08-03 DIAGNOSIS — M25561 Pain in right knee: Secondary | ICD-10-CM | POA: Diagnosis not present

## 2024-08-03 DIAGNOSIS — M1711 Unilateral primary osteoarthritis, right knee: Secondary | ICD-10-CM | POA: Diagnosis not present

## 2024-08-17 ENCOUNTER — Other Ambulatory Visit: Payer: Self-pay | Admitting: Family Medicine

## 2024-09-19 NOTE — Telephone Encounter (Unsigned)
 Copied from CRM #[id number]. Topic: Clinical - Medication Refill >> Sep 19, 2024  4:22 PM Jasmin G wrote: Medication:  omeprazole  (PRILOSEC) 40 MG capsule. Pt requested for a 90 day refill.  Has the patient contacted their pharmacy? No (Agent: If no, request that the patient contact the pharmacy for the refill. If patient does not wish to contact the pharmacy document the reason why and proceed with request.) (Agent: If yes, when and what did the pharmacy advise?)  This is the patient's preferred pharmacy:  [facility] - [place], [place] - [address] [place] [address] Phone: [phone] Fax: [phone]  Is this the correct pharmacy for this prescription? Yes If no, delete pharmacy and type the correct one.   Has the prescription been filled recently? No  Is the patient out of the medication? Yes  Has the patient been seen for an appointment in the last year OR does the patient have an upcoming appointment? Yes  Can we respond through MyChart? No  Agent: Please be advised that Rx refills may take up to 3 business days. We ask that you follow-up with your pharmacy.

## 2024-09-20 MED ORDER — OMEPRAZOLE 40 MG PO CPDR: 40.0000 mg | DELAYED_RELEASE_CAPSULE | Freq: Every day | ORAL | 0 refills | Status: DC

## 2024-10-05 ENCOUNTER — Other Ambulatory Visit: Payer: Self-pay | Admitting: Family Medicine

## 2024-10-10 ENCOUNTER — Telehealth: Payer: Self-pay | Admitting: Internal Medicine

## 2024-10-10 NOTE — Telephone Encounter (Signed)
" *  STAT* If patient is at the pharmacy, call can be transferred to refill team.     1. Which medications need to be refilled? (please list name of each medication and dose if known) clopidogrel  (PLAVIX ) 75 MG tablet   isosorbide  mononitrate (IMDUR ) 30 MG 24 hr tablet  metoprolol  tartrate (LOPRESSOR ) 25 MG tablet     2. Would you like to learn more about the convenience, safety, & potential cost savings by using the [facility]? No       3. Are you open to using the [facility] (Type [facility]. No).     4. Which pharmacy/location (including street and city if local pharmacy) is medication to be sent to?[facility] - [place], [place] - [address]       5. Do they need a 30 day or 90 day supply? 90 day   Pt is out of metoprolol  needs 1 week supply sent to [facility] on file  "

## 2024-10-11 ENCOUNTER — Other Ambulatory Visit: Payer: Self-pay | Admitting: Internal Medicine

## 2024-10-11 MED ORDER — CLOPIDOGREL BISULFATE 75 MG PO TABS: 75.0000 mg | ORAL_TABLET | Freq: Every day | ORAL | 2 refills | Status: AC

## 2024-10-11 MED ORDER — ATORVASTATIN CALCIUM 40 MG PO TABS: 40.0000 mg | ORAL_TABLET | Freq: Every day | ORAL | 3 refills | Status: AC

## 2024-10-11 MED ORDER — METOPROLOL TARTRATE 25 MG PO TABS: 25.0000 mg | ORAL_TABLET | Freq: Two times a day (BID) | ORAL | 0 refills | Status: DC

## 2024-10-11 MED ORDER — METOPROLOL TARTRATE 25 MG PO TABS: 25.0000 mg | ORAL_TABLET | Freq: Two times a day (BID) | ORAL | 3 refills | Status: DC

## 2024-10-11 MED ORDER — ISOSORBIDE MONONITRATE ER 30 MG PO TB24: 15.0000 mg | ORAL_TABLET | Freq: Every day | ORAL | 2 refills | Status: AC

## 2024-10-11 MED ORDER — METOPROLOL TARTRATE 25 MG PO TABS: 25.0000 mg | ORAL_TABLET | Freq: Two times a day (BID) | ORAL | 2 refills | Status: AC

## 2024-10-11 NOTE — Telephone Encounter (Signed)
 Pt's medications were sent to pt's pharmacy as requested. Confirmation received.

## 2024-10-22 ENCOUNTER — Encounter: Payer: Self-pay | Admitting: Dermatology

## 2024-10-22 ENCOUNTER — Ambulatory Visit: Admitting: Dermatology

## 2024-10-22 DIAGNOSIS — D1801 Hemangioma of skin and subcutaneous tissue: Secondary | ICD-10-CM

## 2024-10-22 DIAGNOSIS — D229 Melanocytic nevi, unspecified: Secondary | ICD-10-CM

## 2024-10-22 DIAGNOSIS — L578 Other skin changes due to chronic exposure to nonionizing radiation: Secondary | ICD-10-CM

## 2024-10-22 DIAGNOSIS — D485 Neoplasm of uncertain behavior of skin: Secondary | ICD-10-CM

## 2024-10-22 DIAGNOSIS — Z1283 Encounter for screening for malignant neoplasm of skin: Secondary | ICD-10-CM

## 2024-10-22 DIAGNOSIS — L821 Other seborrheic keratosis: Secondary | ICD-10-CM

## 2024-10-22 DIAGNOSIS — L570 Actinic keratosis: Secondary | ICD-10-CM

## 2024-10-22 DIAGNOSIS — L814 Other melanin hyperpigmentation: Secondary | ICD-10-CM

## 2024-10-22 NOTE — Patient Instructions (Addendum)
 Cryotherapy Aftercare (Spots we froze on your scalp and arms)  Wash gently with soap and water  everyday.   Apply Vaseline and Band-Aid daily until healed. We will start Efudex (F-5U crea,) which is a topical chemotherapy cream that treats pre-cancers in March    Patient Handout: Wound Care for Skin Biopsy Site  Taking Care of Your Skin Biopsy Site  Proper care of the biopsy site is essential for promoting healing and minimizing scarring. This handout provides instructions on how to care for your biopsy site to ensure optimal recovery.  1. Cleaning the Wound:  Clean the biopsy site daily with gentle soap and water . Gently pat the area dry with a clean, soft towel. Avoid harsh scrubbing or rubbing the area, as this can irritate the skin and delay healing.  2. Applying Aquaphor and Bandage:  After cleaning the wound, apply a thin layer of Aquaphor ointment to the biopsy site. Cover the area with a sterile bandage to protect it from dirt, bacteria, and friction. Change the bandage daily or as needed if it becomes soiled or wet.  3. Continued Care for One Week:  Repeat the cleaning, Aquaphor application, and bandaging process daily for one week following the biopsy procedure. Keeping the wound clean and moist during this initial healing period will help prevent infection and promote optimal healing.  4. Massaging Aquaphor into the Area:  ---After one week, discontinue the use of bandages but continue to apply Aquaphor to the biopsy site. ----Gently massage the Aquaphor into the area using circular motions. ---Massaging the skin helps to promote circulation and prevent the formation of scar tissue.   Additional Tips:  Avoid exposing the biopsy site to direct sunlight during the healing process, as this can cause hyperpigmentation or worsen scarring. If you experience any signs of infection, such as increased redness, swelling, warmth, or drainage from the wound, contact your  healthcare provider immediately. Follow any additional instructions provided by your healthcare provider for caring for the biopsy site and managing any discomfort. Conclusion:  Taking proper care of your skin biopsy site is crucial for ensuring optimal healing and minimizing scarring. By following these instructions for cleaning, applying Aquaphor, and massaging the area, you can promote a smooth and successful recovery. If you have any questions or concerns about caring for your biopsy site, don't hesitate to contact your healthcare provider for guidance.         Important Information  Due to recent changes in healthcare laws, you may see results of your pathology and/or laboratory studies on MyChart before the doctors have had a chance to review them. We understand that in some cases there may be results that are confusing or concerning to you. Please understand that not all results are received at the same time and often the doctors may need to interpret multiple results in order to provide you with the best plan of care or course of treatment. Therefore, we ask that you please give us  2 business days to thoroughly review all your results before contacting the office for clarification. Should we see a critical lab result, you will be contacted sooner.   If You Need Anything After Your Visit  If you have any questions or concerns for your doctor, please call our main line at [phone] If no one answers, please leave a voicemail as directed and we will return your call as soon as possible. Messages left after 4 pm will be answered the following business day.   You may also  send us  a message via MyChart. We typically respond to MyChart messages within 1-2 business days.  For prescription refills, please ask your pharmacy to contact our office. Our fax number is [phone].  If you have an urgent issue when the clinic is closed that cannot wait until the next business day, you can page  your doctor at the number below.    Please note that while we do our best to be available for urgent issues outside of office hours, we are not available 24/7.   If you have an urgent issue and are unable to reach us , you may choose to seek medical care at your doctor's office, retail clinic, urgent care center, or emergency room.  If you have a medical emergency, please immediately call 911 or go to the emergency department. In the event of inclement weather, please call our main line at [phone] for an update on the status of any delays or closures.  Dermatology Medication Tips: Please keep the boxes that topical medications come in in order to help keep track of the instructions about where and how to use these. Pharmacies typically print the medication instructions only on the boxes and not directly on the medication tubes.   If your medication is too expensive, please contact our office at [phone] or send us  a message through MyChart.   We are unable to tell what your co-pay for medications will be in advance as this is different depending on your insurance coverage. However, we may be able to find a substitute medication at lower cost or fill out paperwork to get insurance to cover a needed medication.   If a prior authorization is required to get your medication covered by your insurance company, please allow us  1-2 business days to complete this process.  Drug prices often vary depending on where the prescription is filled and some pharmacies may offer cheaper prices.  The website www.goodrx.com contains coupons for medications through different pharmacies. The prices here do not account for what the cost may be with help from insurance (it may be cheaper with your insurance), but the website can give you the price if you did not use any insurance.  - You can print the associated coupon and take it with your prescription to the pharmacy.  - You may also stop by our office  during regular business hours and pick up a GoodRx coupon card.  - If you need your prescription sent electronically to a different pharmacy, notify our office through [facility] or by phone at [phone]

## 2024-10-22 NOTE — Progress Notes (Addendum)
 "  New Patient Visit  Patient (and/or pt guardian) consented to the use of AI-assisted tools for note generation.    Subjective  [name] [name] is a 84 y.o. male who presents for the following:  Total Body Skin Exam (TBSE)  The patient reports he has spots, moles and lesions to be evaluated, some may be new or changing and the patient may have concern these could be cancer.  Patient would like to discuss spots on scalp Patient was previously been treated by a dermatologist around 15 years ago Patient reports he  does not have hx of bx. Patient denies family history of skin cancers.  Patient reports throughout his lifetime has had no sun exposure. Currently, patient reports if he  has excessive sun exposure, he  does not apply sunscreen and/or wears protective coverings.  The following portions of the chart were reviewed this encounter and updated as appropriate: medications, allergies, medical history  Review of Systems:  No other skin or systemic complaints except as noted in HPI or Assessment and Plan.  Objective  Well appearing patient in no apparent distress; mood and affect are within normal limits.  A full examination was performed including scalp, head, eyes, ears, nose, lips, neck, chest, axillae, abdomen, back, buttocks, bilateral upper extremities, bilateral lower extremities, hands, feet, fingers, toes, fingernails, and toenails. All findings within normal limits unless otherwise noted below.   Relevant exam findings are noted in the Assessment and Plan. Chest - Medial [facility]), Left Breast, Left Forearm - Posterior (5), Left Hand - Posterior (2), Mid Forehead (2), Mid Frontal Scalp (3), Mid Parietal Scalp (8), Right Breast, Right Forearm - Anterior (2), Right Forearm - Posterior (2), Right Hand - Posterior, Right Parietal Scalp, Right Temple (3) Pink gritty papule Right Index Finger 1.5 cm crusted plaque  Left Medial Back 1 cm pearly papule   Assessment & Plan    LENTIGINES, SEBORRHEIC KERATOSES, HEMANGIOMAS - Benign normal skin lesions - Benign-appearing - Call for any changes  MELANOCYTIC NEVI - Tan-brown and/or pink-flesh-colored symmetric macules and papules - Benign appearing on exam today - Observation - Call clinic for new or changing moles - Recommend daily use of broad spectrum spf 30+ sunscreen to sun-exposed areas.   ACTINIC DAMAGE - Chronic condition, secondary to cumulative UV/sun exposure - diffuse scaly erythematous macules with underlying dyspigmentation - Recommend daily broad spectrum sunscreen SPF 30+ to sun-exposed areas, reapply every 2 hours as needed.  - Staying in the shade or wearing long sleeves, sun glasses (UVA+UVB protection) and wide brim hats (4-inch brim around the entire circumference of the hat) are also recommended for sun protection.  - Call for new or changing lesions.  SKIN CANCER SCREENING PERFORMED TODAY  A- Right index finger 1.5 cm crusted plaque B - Left medial Back - 1 cm pearly papule r/o melanoma  AK (ACTINIC KERATOSIS) (32) Chest - Medial (Center), Left Breast, Left Forearm - Posterior (5), Left Hand - Posterior (2), Mid Forehead (2), Mid Frontal Scalp (3), Mid Parietal Scalp (8), Right Breast, Right Forearm - Anterior (2), Right Forearm - Posterior (2), Right Hand - Posterior, Right Parietal Scalp, Right Temple (3) - Destruction of lesion - Chest - Medial (Center), Left Breast, Left Forearm - Posterior (5), Left Hand - Posterior (2), Mid Forehead (2), Mid Frontal Scalp (3), Mid Parietal Scalp (8), Right Breast, Right Forearm - Anterior (2), Right Forearm - Posterior (2), Right Hand - Posterior, Right Parietal Scalp, Right Temple (3) Complexity: simple  Destruction method: cryotherapy   Informed consent: discussed and consent obtained   Timeout:  patient name, date of birth, surgical site, and procedure verified Lesion destroyed using liquid nitrogen: Yes   Region frozen until ice ball extended  beyond lesion: Yes   Outcome: patient tolerated procedure well with no complications   Post-procedure details: wound care instructions given    NEOPLASM OF UNCERTAIN BEHAVIOR OF SKIN (2) Right Index Finger - Epidermal / dermal shaving  Lesion diameter (cm):  1.5 Informed consent: discussed and consent obtained   Timeout: patient name, date of birth, surgical site, and procedure verified   Procedure prep:  Patient was prepped and draped in usual sterile fashion Prep type:  Isopropyl alcohol Anesthesia: the lesion was anesthetized in a standard fashion   Anesthetic:  1% lidocaine  w/ epinephrine  1-100,000 local infiltration Instrument used: DermaBlade   Hemostasis achieved with: aluminum chloride   Outcome: patient tolerated procedure well   Post-procedure details: wound care instructions given    Specimen A - Surgical pathology Differential Diagnosis: r/o DN  Check Margins: No Left Medial Back - Epidermal / dermal shaving  Lesion diameter (cm):  1 Informed consent: discussed and consent obtained   Timeout: patient name, date of birth, surgical site, and procedure verified   Procedure prep:  Patient was prepped and draped in usual sterile fashion Prep type:  Isopropyl alcohol Anesthesia: the lesion was anesthetized in a standard fashion   Anesthetic:  1% lidocaine  w/ epinephrine  1-100,000 local infiltration Instrument used: DermaBlade   Outcome: patient tolerated procedure well   Post-procedure details: wound care instructions given    Specimen B - Surgical pathology Differential Diagnosis: r/o Melanoma  Check Margins: No SKIN EXAM FOR MALIGNANT NEOPLASM   MULTIPLE BENIGN MELANOCYTIC NEVI   CHERRY ANGIOMA   LENTIGINES   SEBORRHEIC KERATOSIS   ACTINIC SKIN DAMAGE    Will allow areas treated with Cryo to heal and will prescribe Efudex at next appointment Return in about 2 months (around 12/20/2024) for AK f/u.  LILLETTE Lyle Cords, am acting as a neurosurgeon for  [employer], DO .  Documentation: I have reviewed the above documentation for accuracy and completeness, and I agree with the above.  Delon Lenis, DO   "

## 2024-10-24 LAB — SURGICAL PATHOLOGY

## 2024-10-30 ENCOUNTER — Ambulatory Visit: Admitting: Family Medicine

## 2024-10-31 ENCOUNTER — Ambulatory Visit: Payer: Self-pay | Admitting: Dermatology

## 2024-10-31 DIAGNOSIS — D099 Carcinoma in situ, unspecified: Secondary | ICD-10-CM | POA: Insufficient documentation

## 2024-10-31 NOTE — Progress Notes (Signed)
 Hi [name] [name],  Pt's bx on finger as a SCCIS, please call pt and inform him of results and that the SCCIS on his finger will need to be treated with in office cryosurgery or ED&C followed by topical chemo therapy (Efudex) for 6 weeks.  The lesion removed on his back was benign.  Thanks!  FINAL DIAGNOSIS        1. Skin, right index finger :       SQUAMOUS CELL CARCINOMA IN SITU        2. Skin, left medial back :       LICHEN PLANUS-LIKE KERATOSIS, PIGMENTED

## 2024-11-06 ENCOUNTER — Ambulatory Visit: Payer: Self-pay | Admitting: Family Medicine

## 2024-11-06 ENCOUNTER — Ambulatory Visit: Admitting: Family Medicine

## 2024-11-06 ENCOUNTER — Encounter: Payer: Self-pay | Admitting: Family Medicine

## 2024-11-06 VITALS — BP 108/68 | HR 70 | Temp 98.2°F | Ht 68.0 in | Wt 165.8 lb

## 2024-11-06 DIAGNOSIS — I10 Essential (primary) hypertension: Secondary | ICD-10-CM | POA: Diagnosis not present

## 2024-11-06 DIAGNOSIS — R739 Hyperglycemia, unspecified: Secondary | ICD-10-CM | POA: Diagnosis not present

## 2024-11-06 DIAGNOSIS — I2510 Atherosclerotic heart disease of native coronary artery without angina pectoris: Secondary | ICD-10-CM | POA: Diagnosis not present

## 2024-11-06 DIAGNOSIS — E785 Hyperlipidemia, unspecified: Secondary | ICD-10-CM

## 2024-11-06 DIAGNOSIS — Z131 Encounter for screening for diabetes mellitus: Secondary | ICD-10-CM

## 2024-11-06 LAB — CBC WITH DIFFERENTIAL/PLATELET
Basophils Absolute: 0 10*3/uL (ref 0.0–0.1)
Basophils Relative: 0.7 % (ref 0.0–3.0)
Eosinophils Absolute: 0.2 10*3/uL (ref 0.0–0.7)
Eosinophils Relative: 3.4 % (ref 0.0–5.0)
HCT: 40.8 % (ref 39.0–52.0)
Hemoglobin: 14.2 g/dL (ref 13.0–17.0)
Lymphocytes Relative: 30 % (ref 12.0–46.0)
Lymphs Abs: 1.6 10*3/uL (ref 0.7–4.0)
MCHC: 34.8 g/dL (ref 30.0–36.0)
MCV: 89.8 fl (ref 78.0–100.0)
Monocytes Absolute: 0.5 10*3/uL (ref 0.1–1.0)
Monocytes Relative: 9.2 % (ref 3.0–12.0)
Neutro Abs: 3 10*3/uL (ref 1.4–7.7)
Neutrophils Relative %: 56.7 % (ref 43.0–77.0)
Platelets: 210 10*3/uL (ref 150.0–400.0)
RBC: 4.54 Mil/uL (ref 4.22–5.81)
RDW: 13.3 % (ref 11.5–15.5)
WBC: 5.3 10*3/uL (ref 4.0–10.5)

## 2024-11-06 LAB — COMPREHENSIVE METABOLIC PANEL WITH GFR
ALT: 21 U/L (ref 3–53)
AST: 22 U/L (ref 5–37)
Albumin: 4 g/dL (ref 3.5–5.2)
Alkaline Phosphatase: 54 U/L (ref 39–117)
BUN: 18 mg/dL (ref 6–23)
CO2: 29 meq/L (ref 19–32)
Calcium: 9.3 mg/dL (ref 8.4–10.5)
Chloride: 102 meq/L (ref 96–112)
Creatinine, Ser: 0.77 mg/dL (ref 0.40–1.50)
GFR: 84.03 mL/min
Glucose, Bld: 90 mg/dL (ref 70–99)
Potassium: 4.2 meq/L (ref 3.5–5.1)
Sodium: 137 meq/L (ref 135–145)
Total Bilirubin: 0.5 mg/dL (ref 0.2–1.2)
Total Protein: 6.6 g/dL (ref 6.0–8.3)

## 2024-11-06 LAB — HEMOGLOBIN A1C: Hgb A1c MFr Bld: 5.4 % (ref 4.6–6.5)

## 2024-11-09 NOTE — Addendum Note (Signed)
 Addended by: ALM DELON SAILOR on: 11/09/2024 10:32 AM   Modules accepted: Orders

## 2024-12-24 ENCOUNTER — Ambulatory Visit: Admitting: Dermatology

## 2025-03-18 ENCOUNTER — Ambulatory Visit

## 2025-04-29 ENCOUNTER — Encounter: Admitting: Family Medicine
